# Patient Record
Sex: Female | Born: 1975 | Race: White | Hispanic: No | Marital: Married | Smoking: Never smoker
Health system: Southern US, Community
[De-identification: ages and names within clinical notes are randomized; demographics above are authoritative.]

## PROBLEM LIST (undated history)

## (undated) DIAGNOSIS — E119 Type 2 diabetes mellitus without complications: Secondary | ICD-10-CM

## (undated) DIAGNOSIS — E669 Obesity, unspecified: Secondary | ICD-10-CM

## (undated) DIAGNOSIS — I739 Peripheral vascular disease, unspecified: Secondary | ICD-10-CM

## (undated) DIAGNOSIS — G4730 Sleep apnea, unspecified: Secondary | ICD-10-CM

## (undated) DIAGNOSIS — R7989 Other specified abnormal findings of blood chemistry: Secondary | ICD-10-CM

## (undated) DIAGNOSIS — E042 Nontoxic multinodular goiter: Secondary | ICD-10-CM

## (undated) DIAGNOSIS — Z794 Long term (current) use of insulin: Secondary | ICD-10-CM

## (undated) DIAGNOSIS — Z86718 Personal history of other venous thrombosis and embolism: Secondary | ICD-10-CM

## (undated) DIAGNOSIS — Z87442 Personal history of urinary calculi: Secondary | ICD-10-CM

## (undated) DIAGNOSIS — I200 Unstable angina: Secondary | ICD-10-CM

## (undated) DIAGNOSIS — I10 Essential (primary) hypertension: Secondary | ICD-10-CM

## (undated) HISTORY — DX: Type 2 diabetes mellitus without complications: Z79.4

## (undated) HISTORY — DX: Other specified abnormal findings of blood chemistry: R79.89

## (undated) HISTORY — DX: Personal history of other venous thrombosis and embolism: Z86.718

## (undated) HISTORY — DX: Type 2 diabetes mellitus without complications: E11.9

## (undated) HISTORY — DX: Unstable angina: I20.0

---

## 1999-12-03 ENCOUNTER — Encounter: Payer: Self-pay | Admitting: Internal Medicine

## 1999-12-03 ENCOUNTER — Ambulatory Visit (HOSPITAL_COMMUNITY): Admission: RE | Admit: 1999-12-03 | Discharge: 1999-12-03 | Payer: Self-pay | Admitting: Internal Medicine

## 2007-03-28 HISTORY — PX: OTHER SURGICAL HISTORY: SHX169

## 2008-12-21 ENCOUNTER — Ambulatory Visit (HOSPITAL_COMMUNITY): Admission: RE | Admit: 2008-12-21 | Discharge: 2008-12-21 | Payer: Self-pay | Admitting: *Deleted

## 2012-07-08 DIAGNOSIS — E782 Mixed hyperlipidemia: Secondary | ICD-10-CM | POA: Insufficient documentation

## 2012-07-08 DIAGNOSIS — N200 Calculus of kidney: Secondary | ICD-10-CM | POA: Insufficient documentation

## 2012-07-08 DIAGNOSIS — E785 Hyperlipidemia, unspecified: Secondary | ICD-10-CM

## 2012-07-08 DIAGNOSIS — E119 Type 2 diabetes mellitus without complications: Secondary | ICD-10-CM

## 2012-07-08 DIAGNOSIS — Z794 Long term (current) use of insulin: Secondary | ICD-10-CM

## 2012-07-08 DIAGNOSIS — I2510 Atherosclerotic heart disease of native coronary artery without angina pectoris: Secondary | ICD-10-CM

## 2012-07-08 DIAGNOSIS — R319 Hematuria, unspecified: Secondary | ICD-10-CM

## 2012-07-08 HISTORY — DX: Atherosclerotic heart disease of native coronary artery without angina pectoris: I25.10

## 2012-07-08 HISTORY — DX: Hematuria, unspecified: R31.9

## 2012-07-08 HISTORY — DX: Hyperlipidemia, unspecified: E78.5

## 2012-07-08 HISTORY — DX: Long term (current) use of insulin: Z79.4

## 2012-07-08 HISTORY — DX: Calculus of kidney: N20.0

## 2012-07-08 HISTORY — DX: Type 2 diabetes mellitus without complications: E11.9

## 2014-02-19 DIAGNOSIS — Z9884 Bariatric surgery status: Secondary | ICD-10-CM | POA: Insufficient documentation

## 2014-02-19 HISTORY — DX: Bariatric surgery status: Z98.84

## 2019-06-16 ENCOUNTER — Other Ambulatory Visit: Payer: Self-pay

## 2019-06-16 ENCOUNTER — Encounter (HOSPITAL_COMMUNITY): Payer: Self-pay | Admitting: Emergency Medicine

## 2019-06-16 ENCOUNTER — Emergency Department (HOSPITAL_COMMUNITY): Payer: 59

## 2019-06-16 ENCOUNTER — Inpatient Hospital Stay (HOSPITAL_COMMUNITY)
Admission: EM | Admit: 2019-06-16 | Discharge: 2019-06-24 | DRG: 234 | Disposition: A | Discharge status: Home / Self Care | Payer: 59 | Attending: Thoracic Surgery (Cardiothoracic Vascular Surgery) | Admitting: Thoracic Surgery (Cardiothoracic Vascular Surgery)

## 2019-06-16 DIAGNOSIS — Z20822 Contact with and (suspected) exposure to covid-19: Secondary | ICD-10-CM | POA: Diagnosis present

## 2019-06-16 DIAGNOSIS — I10 Essential (primary) hypertension: Secondary | ICD-10-CM | POA: Diagnosis not present

## 2019-06-16 DIAGNOSIS — R079 Chest pain, unspecified: Secondary | ICD-10-CM | POA: Diagnosis present

## 2019-06-16 DIAGNOSIS — I119 Hypertensive heart disease without heart failure: Secondary | ICD-10-CM | POA: Diagnosis present

## 2019-06-16 DIAGNOSIS — Z951 Presence of aortocoronary bypass graft: Secondary | ICD-10-CM

## 2019-06-16 DIAGNOSIS — D518 Other vitamin B12 deficiency anemias: Secondary | ICD-10-CM | POA: Diagnosis present

## 2019-06-16 DIAGNOSIS — Z6841 Body Mass Index (BMI) 40.0 and over, adult: Secondary | ICD-10-CM

## 2019-06-16 DIAGNOSIS — Z955 Presence of coronary angioplasty implant and graft: Secondary | ICD-10-CM

## 2019-06-16 DIAGNOSIS — Z09 Encounter for follow-up examination after completed treatment for conditions other than malignant neoplasm: Secondary | ICD-10-CM

## 2019-06-16 DIAGNOSIS — Z9641 Presence of insulin pump (external) (internal): Secondary | ICD-10-CM | POA: Diagnosis present

## 2019-06-16 DIAGNOSIS — R7989 Other specified abnormal findings of blood chemistry: Secondary | ICD-10-CM

## 2019-06-16 DIAGNOSIS — E8770 Fluid overload, unspecified: Secondary | ICD-10-CM | POA: Diagnosis not present

## 2019-06-16 DIAGNOSIS — E119 Type 2 diabetes mellitus without complications: Secondary | ICD-10-CM

## 2019-06-16 DIAGNOSIS — E872 Acidosis: Secondary | ICD-10-CM | POA: Diagnosis not present

## 2019-06-16 DIAGNOSIS — E669 Obesity, unspecified: Secondary | ICD-10-CM | POA: Diagnosis present

## 2019-06-16 DIAGNOSIS — Z9111 Patient's noncompliance with dietary regimen: Secondary | ICD-10-CM

## 2019-06-16 DIAGNOSIS — Z91013 Allergy to seafood: Secondary | ICD-10-CM

## 2019-06-16 DIAGNOSIS — I25119 Atherosclerotic heart disease of native coronary artery with unspecified angina pectoris: Secondary | ICD-10-CM

## 2019-06-16 DIAGNOSIS — Z9884 Bariatric surgery status: Secondary | ICD-10-CM

## 2019-06-16 DIAGNOSIS — D62 Acute posthemorrhagic anemia: Secondary | ICD-10-CM | POA: Diagnosis not present

## 2019-06-16 DIAGNOSIS — E785 Hyperlipidemia, unspecified: Secondary | ICD-10-CM | POA: Diagnosis present

## 2019-06-16 DIAGNOSIS — E282 Polycystic ovarian syndrome: Secondary | ICD-10-CM | POA: Diagnosis present

## 2019-06-16 DIAGNOSIS — Z8249 Family history of ischemic heart disease and other diseases of the circulatory system: Secondary | ICD-10-CM

## 2019-06-16 DIAGNOSIS — J9811 Atelectasis: Secondary | ICD-10-CM

## 2019-06-16 DIAGNOSIS — Z7982 Long term (current) use of aspirin: Secondary | ICD-10-CM

## 2019-06-16 DIAGNOSIS — Z794 Long term (current) use of insulin: Secondary | ICD-10-CM

## 2019-06-16 DIAGNOSIS — R197 Diarrhea, unspecified: Secondary | ICD-10-CM | POA: Diagnosis not present

## 2019-06-16 DIAGNOSIS — E876 Hypokalemia: Secondary | ICD-10-CM | POA: Diagnosis present

## 2019-06-16 DIAGNOSIS — I2510 Atherosclerotic heart disease of native coronary artery without angina pectoris: Secondary | ICD-10-CM | POA: Diagnosis present

## 2019-06-16 DIAGNOSIS — I25110 Atherosclerotic heart disease of native coronary artery with unstable angina pectoris: Principal | ICD-10-CM | POA: Diagnosis present

## 2019-06-16 DIAGNOSIS — I2582 Chronic total occlusion of coronary artery: Secondary | ICD-10-CM | POA: Diagnosis present

## 2019-06-16 DIAGNOSIS — Z86718 Personal history of other venous thrombosis and embolism: Secondary | ICD-10-CM

## 2019-06-16 DIAGNOSIS — D649 Anemia, unspecified: Secondary | ICD-10-CM | POA: Diagnosis present

## 2019-06-16 DIAGNOSIS — Z91048 Other nonmedicinal substance allergy status: Secondary | ICD-10-CM

## 2019-06-16 DIAGNOSIS — I252 Old myocardial infarction: Secondary | ICD-10-CM

## 2019-06-16 DIAGNOSIS — I8391 Asymptomatic varicose veins of right lower extremity: Secondary | ICD-10-CM | POA: Diagnosis present

## 2019-06-16 DIAGNOSIS — Z79899 Other long term (current) drug therapy: Secondary | ICD-10-CM

## 2019-06-16 DIAGNOSIS — I700 Atherosclerosis of aorta: Secondary | ICD-10-CM | POA: Diagnosis present

## 2019-06-16 DIAGNOSIS — I340 Nonrheumatic mitral (valve) insufficiency: Secondary | ICD-10-CM | POA: Diagnosis present

## 2019-06-16 DIAGNOSIS — Z833 Family history of diabetes mellitus: Secondary | ICD-10-CM

## 2019-06-16 DIAGNOSIS — E1151 Type 2 diabetes mellitus with diabetic peripheral angiopathy without gangrene: Secondary | ICD-10-CM | POA: Diagnosis present

## 2019-06-16 HISTORY — DX: Type 2 diabetes mellitus without complications: E11.9

## 2019-06-16 HISTORY — DX: Atherosclerotic heart disease of native coronary artery without angina pectoris: I25.10

## 2019-06-16 HISTORY — DX: Obesity, unspecified: E66.9

## 2019-06-16 HISTORY — DX: Essential (primary) hypertension: I10

## 2019-06-16 HISTORY — DX: Chest pain, unspecified: R07.9

## 2019-06-16 HISTORY — DX: Nontoxic multinodular goiter: E04.2

## 2019-06-16 LAB — CBC
HCT: 42.1 % (ref 36.0–46.0)
Hemoglobin: 14.4 g/dL (ref 12.0–15.0)
MCH: 30.2 pg (ref 26.0–34.0)
MCHC: 34.2 g/dL (ref 30.0–36.0)
MCV: 88.3 fL (ref 80.0–100.0)
Platelets: 308 10*3/uL (ref 150–400)
RBC: 4.77 MIL/uL (ref 3.87–5.11)
RDW: 12.5 % (ref 11.5–15.5)
WBC: 8.3 10*3/uL (ref 4.0–10.5)
nRBC: 0 % (ref 0.0–0.2)

## 2019-06-16 LAB — BASIC METABOLIC PANEL
Anion gap: 9 (ref 5–15)
BUN: 11 mg/dL (ref 6–20)
CO2: 28 mmol/L (ref 22–32)
Calcium: 9.3 mg/dL (ref 8.9–10.3)
Chloride: 100 mmol/L (ref 98–111)
Creatinine, Ser: 0.51 mg/dL (ref 0.44–1.00)
GFR calc Af Amer: >60 mL/min (ref >60)
GFR calc non Af Amer: >60 mL/min (ref >60)
Glucose, Bld: 227 mg/dL — ABNORMAL HIGH (ref 70–99)
Potassium: 3.9 mmol/L (ref 3.5–5.1)
Sodium: 137 mmol/L (ref 135–145)

## 2019-06-16 LAB — I-STAT BETA HCG BLOOD, ED (MC, WL, AP ONLY): I-stat hCG, quantitative: <5 m[IU]/mL (ref <5)

## 2019-06-16 LAB — TROPONIN I (HIGH SENSITIVITY)
Troponin I (High Sensitivity): 14 ng/L (ref <18)
Troponin I (High Sensitivity): 16 ng/L (ref <18)

## 2019-06-16 LAB — D-DIMER, QUANTITATIVE: D-Dimer, Quant: 0.81 ug/mL-FEU — ABNORMAL HIGH (ref 0.00–0.50)

## 2019-06-16 MED ORDER — LIDOCAINE VISCOUS HCL 2 % MT SOLN
15.0000 mL | Freq: Once | OROMUCOSAL | Status: AC
  Administered 2019-06-16: 15 mL via ORAL
  Filled 2019-06-16: qty 15

## 2019-06-16 MED ORDER — ALUM & MAG HYDROXIDE-SIMETH 200-200-20 MG/5ML PO SUSP
30.0000 mL | Freq: Once | ORAL | Status: AC
  Administered 2019-06-16: 30 mL via ORAL
  Filled 2019-06-16: qty 30

## 2019-06-16 MED ORDER — SODIUM CHLORIDE 0.9% FLUSH: 3.0000 mL | Freq: Once | INTRAVENOUS | Status: DC

## 2019-06-16 NOTE — ED Provider Notes (Signed)
[facility] EMERGENCY DEPARTMENT Provider Note   CSN: [id number] Arrival date & time: 06/16/19  1612     History No chief complaint on file.   Beverly [name] is a 44 y.o. female.  HPI Patient presents with chest pain.  Has had episodes for couple weeks now.  However has had some pain that feels like reflux, also states however she has pain that goes to her left chest and the arm and up to her left jaw.  She has had a previous stent in 2009 and states that it did feel like this.  No cough.  No fevers.  States has had GI symptoms like this somewhat in the past also.  No fevers.  Has had shortness of breath also.    Past Medical History:  Diagnosis Date  . Diabetes mellitus without complication (HCC)   . Hypertension   . Multiple thyroid nodules     There are no problems to display for this patient.   Past Surgical History:  Procedure Laterality Date  . Cardiac Stent  2009     OB History   No obstetric history on file.     History reviewed. No pertinent family history.  Social History   Tobacco Use  . Smoking status: Never Smoker  . Smokeless tobacco: Never Used  Substance Use Topics  . Alcohol use: Never  . Drug use: Never    Home Medications Prior to Admission medications   Medication Sig Start Date End Date Taking? Authorizing Provider  aluminum-magnesium hydroxide-simethicone (MAALOX) 200-200-20 MG/5ML SUSP Take 30 mLs by mouth 3 (three) times daily as needed (for gas/indigestion).    Yes [provider]  aspirin 81 MG EC tablet Take 81 mg by mouth daily.    Yes [provider]  atorvastatin (LIPITOR) 40 MG tablet Take 40 mg by mouth at bedtime.  12/19/13  Yes [provider]  carvedilol (COREG) 12.5 MG tablet Take 12.5 mg by mouth in the morning.    Yes [provider]  gabapentin (NEURONTIN) 300 MG capsule Take 300 mg by mouth at bedtime.   Yes [provider]  ibuprofen (ADVIL) 200 MG tablet  Take 600 mg by mouth every 6 (six) hours as needed (for pain).   Yes [provider]  Insulin Human (INSULIN PUMP) SOLN Inject 1 each into the skin continuous. MedTronic- Uses Humalog   Yes [provider]  insulin lispro (HUMALOG) 100 UNIT/ML injection Inject into the skin See admin instructions. For use in [employer] insulin pump   Yes [provider]  losartan (COZAAR) 100 MG tablet Take 100 mg by mouth daily.  01/12/14  Yes [provider]  Multiple Vitamins-Calcium (ONE-A-DAY WOMENS FORMULA) TABS Take 1 tablet by mouth daily with breakfast.   Yes [provider]  simethicone (GAS-X EXTRA STRENGTH) 125 MG chewable tablet Chew 125 mg by mouth every 6 (six) hours as needed for flatulence.   Yes [provider]    Allergies    Shellfish-derived products and Tape  Review of Systems   Review of Systems  Constitutional: Negative for appetite change.  HENT: Negative for congestion.   Respiratory: Positive for shortness of breath.   Cardiovascular: Positive for chest pain.  Gastrointestinal: Negative for abdominal pain and nausea.  Genitourinary: Negative for flank pain.  Musculoskeletal: Negative for back pain.  Skin: Negative for rash.  Neurological: Negative for weakness.  Psychiatric/Behavioral: Negative for confusion.    Physical Exam Updated Vital Signs BP 134/77  Pulse 73   Temp 98.8 F (37.1 C) (Oral)   Resp 17   Ht 5' (1.524 m)   Wt 111.1 kg   SpO2 97%   BMI 47.85 kg/m   Physical Exam Vitals and nursing note reviewed.  HENT:     Head: Normocephalic.  Eyes:     Pupils: Pupils are equal, round, and reactive to light.  Cardiovascular:     Rate and Rhythm: Regular rhythm.  Pulmonary:     Breath sounds: No wheezing or rhonchi.  Abdominal:     Tenderness: There is no abdominal tenderness.  Musculoskeletal:     Cervical back: Neck supple.     Right lower leg: No edema.     Left lower leg: No edema.  Skin:     General: Skin is warm.     Capillary Refill: Capillary refill takes less than 2 seconds.  Neurological:     Mental Status: She is alert and oriented to person, place, and time.     ED Results / Procedures / Treatments   Labs (all labs ordered are listed, but only abnormal results are displayed) Labs Reviewed  BASIC METABOLIC PANEL - Abnormal; Notable for the following components:      Result Value   Glucose, Bld 227 (*)    All other components within normal limits  D-DIMER, QUANTITATIVE (NOT AT [facility]) - Abnormal; Notable for the following components:   D-Dimer, Quant 0.81 (*)    All other components within normal limits  CBC  I-STAT BETA HCG BLOOD, ED ([facility], [facility], [facility] ONLY)  TROPONIN I (HIGH SENSITIVITY)  TROPONIN I (HIGH SENSITIVITY)    EKG EKG Interpretation  Date/Time:  Monday June 16 2019 16:19:28 EDT Ventricular Rate:  75 PR Interval:  174 QRS Duration: 92 QT Interval:  358 QTC Calculation: 399 R Axis:   -15 Text Interpretation: Normal sinus rhythm Inferior infarct , age undetermined Anterior infarct , age undetermined Abnormal ECG No old tracing to compare Confirmed by Benjiman Core [phone]) on 06/16/2019 9:03:15 PM   Radiology DG Chest 2 View  Result Date: 06/16/2019 CLINICAL DATA:  Chest pain, neck pain, nausea, history MI EXAM: CHEST - 2 VIEW COMPARISON:  01/18/2018 FINDINGS: Normal heart size, mediastinal contours, and pulmonary vascularity. Atherosclerotic calcification aorta. Lungs clear. No pulmonary infiltrate, pleural effusion or pneumothorax. Bones unremarkable. IMPRESSION: No acute abnormalities. Aortic Atherosclerosis (ICD10-I70.0). Electronically Signed   By: Ulyses Southward M.D.   On: 06/16/2019 16:40    Procedures Procedures (including critical care time)  Medications Ordered in ED Medications  sodium chloride flush (NS) 0.9 % injection 3 mL (3 mLs Intravenous Not Given 06/16/19 2109)  alum & mag hydroxide-simeth (MAALOX/MYLANTA) 200-200-20 MG/5ML  suspension 30 mL (30 mLs Oral Given 06/16/19 2201)    And  lidocaine (XYLOCAINE) 2 % viscous mouth solution 15 mL (15 mLs Oral Given 06/16/19 2201)    ED Course  I have reviewed the triage vital signs and the nursing notes.  Pertinent labs & imaging results that were available during my care of the patient were reviewed by me and considered in my medical decision making (see chart for details).    MDM Rules/Calculators/A&P                      Patient with chest pain radiates to her neck and jaw.  States it does feel like previous heart attack, EKG shows inferior Q waves, however no old to compare.  Has had previous stent.  Troponin  reassuring x2, D-dimer elevated.  Has a history of PE and states she is no longer on her blood thinners.  Will get CTA, however I think should be admitted for chest pain due to left jaw pain like previous angina, even though it was relieved with GI cocktail.  Care will be turned over to Dr. Roxanne Mins. Final Clinical Impression(s) / ED Diagnoses Final diagnoses:  Chest pain, unspecified type    Rx / DC Orders ED Discharge Orders    None       Davonna Belling, MD 06/16/19 2306

## 2019-06-16 NOTE — ED Triage Notes (Signed)
Pt here from home with c/o neck pain that radiates down to her chest , along with some nausea, hx of MI

## 2019-06-17 ENCOUNTER — Observation Stay (HOSPITAL_COMMUNITY): Payer: 59

## 2019-06-17 ENCOUNTER — Encounter (HOSPITAL_COMMUNITY): Payer: Self-pay | Admitting: Family Medicine

## 2019-06-17 DIAGNOSIS — D518 Other vitamin B12 deficiency anemias: Secondary | ICD-10-CM | POA: Diagnosis present

## 2019-06-17 DIAGNOSIS — E876 Hypokalemia: Secondary | ICD-10-CM | POA: Diagnosis present

## 2019-06-17 DIAGNOSIS — D649 Anemia, unspecified: Secondary | ICD-10-CM

## 2019-06-17 DIAGNOSIS — E119 Type 2 diabetes mellitus without complications: Secondary | ICD-10-CM | POA: Diagnosis not present

## 2019-06-17 DIAGNOSIS — I25110 Atherosclerotic heart disease of native coronary artery with unstable angina pectoris: Secondary | ICD-10-CM | POA: Diagnosis not present

## 2019-06-17 DIAGNOSIS — R7989 Other specified abnormal findings of blood chemistry: Secondary | ICD-10-CM | POA: Diagnosis present

## 2019-06-17 DIAGNOSIS — I200 Unstable angina: Secondary | ICD-10-CM

## 2019-06-17 DIAGNOSIS — R079 Chest pain, unspecified: Secondary | ICD-10-CM

## 2019-06-17 DIAGNOSIS — Z794 Long term (current) use of insulin: Secondary | ICD-10-CM | POA: Diagnosis not present

## 2019-06-17 DIAGNOSIS — E669 Obesity, unspecified: Secondary | ICD-10-CM | POA: Diagnosis present

## 2019-06-17 HISTORY — DX: Hypokalemia: E87.6

## 2019-06-17 HISTORY — DX: Anemia, unspecified: D64.9

## 2019-06-17 LAB — HIV ANTIBODY (ROUTINE TESTING W REFLEX): HIV Screen 4th Generation wRfx: NONREACTIVE

## 2019-06-17 LAB — ECHOCARDIOGRAM COMPLETE
Height: 60 in
Weight: 3920 oz

## 2019-06-17 LAB — BASIC METABOLIC PANEL
Anion gap: 9 (ref 5–15)
BUN: 10 mg/dL (ref 6–20)
CO2: 25 mmol/L (ref 22–32)
Calcium: 8 mg/dL — ABNORMAL LOW (ref 8.9–10.3)
Chloride: 107 mmol/L (ref 98–111)
Creatinine, Ser: 0.49 mg/dL (ref 0.44–1.00)
GFR calc Af Amer: >60 mL/min (ref >60)
GFR calc non Af Amer: >60 mL/min (ref >60)
Glucose, Bld: 173 mg/dL — ABNORMAL HIGH (ref 70–99)
Potassium: 3.4 mmol/L — ABNORMAL LOW (ref 3.5–5.1)
Sodium: 141 mmol/L (ref 135–145)

## 2019-06-17 LAB — IRON AND TIBC
Iron: 100 ug/dL (ref 28–170)
Saturation Ratios: 31 % (ref 10.4–31.8)
TIBC: 323 ug/dL (ref 250–450)
UIBC: 223 ug/dL

## 2019-06-17 LAB — CBG MONITORING, ED
Glucose-Capillary: 214 mg/dL — ABNORMAL HIGH (ref 70–99)
Glucose-Capillary: 153 mg/dL — ABNORMAL HIGH (ref 70–99)
Glucose-Capillary: 147 mg/dL — ABNORMAL HIGH (ref 70–99)

## 2019-06-17 LAB — CBC
HCT: 30.2 % — ABNORMAL LOW (ref 36.0–46.0)
HCT: 42 % (ref 36.0–46.0)
Hemoglobin: 10 g/dL — ABNORMAL LOW (ref 12.0–15.0)
Hemoglobin: 14.3 g/dL (ref 12.0–15.0)
MCH: 29.9 pg (ref 26.0–34.0)
MCH: 30 pg (ref 26.0–34.0)
MCHC: 33.1 g/dL (ref 30.0–36.0)
MCHC: 34 g/dL (ref 30.0–36.0)
MCV: 90.4 fL (ref 80.0–100.0)
MCV: 88.1 fL (ref 80.0–100.0)
Platelets: 209 10*3/uL (ref 150–400)
Platelets: 330 10*3/uL (ref 150–400)
RBC: 3.34 MIL/uL — ABNORMAL LOW (ref 3.87–5.11)
RBC: 4.77 MIL/uL (ref 3.87–5.11)
RDW: 12.6 % (ref 11.5–15.5)
RDW: 12.6 % (ref 11.5–15.5)
WBC: 5.3 10*3/uL (ref 4.0–10.5)
WBC: 8.3 10*3/uL (ref 4.0–10.5)
nRBC: 0 % (ref 0.0–0.2)
nRBC: 0 % (ref 0.0–0.2)

## 2019-06-17 LAB — RETICULOCYTES
Immature Retic Fract: 12.7 % (ref 2.3–15.9)
RBC.: 3.31 MIL/uL — ABNORMAL LOW (ref 3.87–5.11)
Retic Count, Absolute: 68.8 10*3/uL (ref 19.0–186.0)
Retic Ct Pct: 2.1 % (ref 0.4–3.1)

## 2019-06-17 LAB — GLUCOSE, CAPILLARY
Glucose-Capillary: 82 mg/dL (ref 70–99)
Glucose-Capillary: 205 mg/dL — ABNORMAL HIGH (ref 70–99)

## 2019-06-17 LAB — LIPID PANEL
Cholesterol: 214 mg/dL — ABNORMAL HIGH (ref 0–200)
HDL: 35 mg/dL — ABNORMAL LOW (ref >40)
LDL Cholesterol: 156 mg/dL — ABNORMAL HIGH (ref 0–99)
Total CHOL/HDL Ratio: 6.1 RATIO
Triglycerides: 113 mg/dL (ref <150)
VLDL: 23 mg/dL (ref 0–40)

## 2019-06-17 LAB — VITAMIN B12: Vitamin B-12: 314 pg/mL (ref 180–914)

## 2019-06-17 LAB — FERRITIN: Ferritin: 29 ng/mL (ref 11–307)

## 2019-06-17 LAB — SARS CORONAVIRUS 2 (TAT 6-24 HRS): SARS Coronavirus 2: NEGATIVE

## 2019-06-17 LAB — HEMOGLOBIN A1C
Hgb A1c MFr Bld: 10.2 % — ABNORMAL HIGH (ref 4.8–5.6)
Mean Plasma Glucose: 246.04 mg/dL

## 2019-06-17 LAB — FOLATE: Folate: 29.7 ng/mL (ref >5.9)

## 2019-06-17 MED ORDER — ALUM & MAG HYDROXIDE-SIMETH 200-200-20 MG/5ML PO SUSP
30.0000 mL | Freq: Four times a day (QID) | ORAL | Status: DC | PRN
  Administered 2019-06-17: 30 mL via ORAL
  Filled 2019-06-17: qty 30

## 2019-06-17 MED ORDER — NITROGLYCERIN 0.4 MG SL SUBL: 0.4000 mg | SUBLINGUAL_TABLET | SUBLINGUAL | Status: DC | PRN

## 2019-06-17 MED ORDER — ASPIRIN 81 MG PO CHEW
324.0000 mg | CHEWABLE_TABLET | ORAL | Status: AC
  Administered 2019-06-17: 324 mg via ORAL
  Filled 2019-06-17: qty 4

## 2019-06-17 MED ORDER — ASPIRIN 81 MG PO CHEW
81.0000 mg | CHEWABLE_TABLET | Freq: Every day | ORAL | Status: DC
  Administered 2019-06-17 – 2019-06-18 (×2): 81 mg via ORAL
  Filled 2019-06-17 (×2): qty 1

## 2019-06-17 MED ORDER — ASPIRIN 300 MG RE SUPP: 300.0000 mg | RECTAL | Status: AC

## 2019-06-17 MED ORDER — ENOXAPARIN SODIUM 40 MG/0.4ML ~~LOC~~ SOLN
40.0000 mg | SUBCUTANEOUS | Status: DC
  Administered 2019-06-17 – 2019-06-19 (×3): 40 mg via SUBCUTANEOUS
  Filled 2019-06-17 (×3): qty 0.4

## 2019-06-17 MED ORDER — LOSARTAN POTASSIUM 50 MG PO TABS
100.0000 mg | ORAL_TABLET | Freq: Every day | ORAL | Status: DC
  Administered 2019-06-17 – 2019-06-18 (×2): 100 mg via ORAL
  Filled 2019-06-17 (×2): qty 2

## 2019-06-17 MED ORDER — CARVEDILOL 12.5 MG PO TABS
12.5000 mg | ORAL_TABLET | Freq: Every morning | ORAL | Status: DC
  Administered 2019-06-17 – 2019-06-19 (×3): 12.5 mg via ORAL
  Filled 2019-06-17 (×3): qty 1

## 2019-06-17 MED ORDER — IOHEXOL 350 MG/ML SOLN
75.0000 mL | Freq: Once | INTRAVENOUS | Status: AC | PRN
  Administered 2019-06-17: 75 mL via INTRAVENOUS

## 2019-06-17 MED ORDER — ACETAMINOPHEN 325 MG PO TABS: 650.0000 mg | ORAL_TABLET | ORAL | Status: DC | PRN

## 2019-06-17 MED ORDER — ONDANSETRON HCL 4 MG/2ML IJ SOLN: 4.0000 mg | Freq: Four times a day (QID) | INTRAMUSCULAR | Status: DC | PRN

## 2019-06-17 MED ORDER — INSULIN PUMP
Freq: Three times a day (TID) | SUBCUTANEOUS | Status: DC
  Administered 2019-06-17: 2.9 via SUBCUTANEOUS
  Administered 2019-06-18: 22:00:00 0.9 via SUBCUTANEOUS
  Filled 2019-06-17: qty 1

## 2019-06-17 MED ORDER — FAMOTIDINE IN NACL 20-0.9 MG/50ML-% IV SOLN
20.0000 mg | Freq: Two times a day (BID) | INTRAVENOUS | Status: DC
  Administered 2019-06-17 – 2019-06-18 (×4): 20 mg via INTRAVENOUS
  Filled 2019-06-17 (×4): qty 50

## 2019-06-17 MED ORDER — GABAPENTIN 300 MG PO CAPS
300.0000 mg | ORAL_CAPSULE | Freq: Every day | ORAL | Status: DC
  Administered 2019-06-17 – 2019-06-18 (×3): 300 mg via ORAL
  Filled 2019-06-17: qty 1
  Filled 2019-06-17: qty 3
  Filled 2019-06-17: qty 1

## 2019-06-17 MED ORDER — POTASSIUM CHLORIDE CRYS ER 20 MEQ PO TBCR
40.0000 meq | EXTENDED_RELEASE_TABLET | Freq: Once | ORAL | Status: AC
  Administered 2019-06-17: 10:00:00 40 meq via ORAL
  Filled 2019-06-17: qty 2

## 2019-06-17 MED ORDER — ATORVASTATIN CALCIUM 40 MG PO TABS
40.0000 mg | ORAL_TABLET | Freq: Every day | ORAL | Status: DC
  Administered 2019-06-17: 16:00:00 40 mg via ORAL
  Filled 2019-06-17: qty 1

## 2019-06-17 NOTE — Consult Note (Addendum)
Cardiology Consultation:   Patient ID: Beverly [name] MRN: [id number]; DOB: February 09, 1976  Admit date: 06/16/2019 Date of Consult: 06/17/2019  Primary Care Provider: [name], MD Primary Cardiologist: remotely Dr. Bing Matter (2015) Primary Electrophysiologist:  None    Patient Profile:   Beverly [name] is a 44 y.o. female with a hx of DM II on insulin pump, CAD s/p stent 2009, HTN, HLD, h/o DVT, morbid obesity s/p bariatric surgery and PCOS who is being seen today for the evaluation of chest pain at the request of Dr. Irene Limbo.  History of Present Illness:   Beverly [name] is a pleasant 44 year old female with past medical history of DM II on insulin pump, CAD s/p stent 2009, HTN, HLD, h/o DVT, morbid obesity s/p bariatric surgery and PCOS.  She underwent cardiac catheterization and PCI to unknown vessel in the setting of MI in 2009.  Afterward, she was followed up with Dr. Bing Matter.  She has been lost to follow-up since 2015.  According to patient, she has only been intermittently compliant with the statin medication.  On average, she missed at least 2 days of her statin medication per week.  Since this past weekend, she has been having intermittent left-sided chest pain under the left breast.  She also has not been noticing some nausea and vomiting.  The symptoms is worsened by physical exertion and relieved by rest.  However the pain can also occur at rest.  She says sometimes start chest discomfort last for several hours at a time.  The location of the symptom is similar to the previous angina, however she does not remember nausea and vomiting associated with the previous MI.  Eventually, she sought medical attention on 06/16/2019 because of the persistent nausea and vomiting.  Despite intermittent chest pain, high-sensitivity troponin was negative x2.  D-dimer was elevated.  However CT angiogram of the chest was negative for PE, it does show scant coronary artery disease which appears to be  advanced for her age.  COVID-19 test was negative.  EKG shows sinus rhythm with poor R wave progression in anterior leads and evidence of Q waves in the inferior leads.  There is no previous EKG to compare.  Cardiology has been consulted for her chest pain.   Past Medical History:  Diagnosis Date  . CAD (coronary artery disease) 06/16/2019  . Diabetes mellitus without complication (HCC)   . Hypertension   . Multiple thyroid nodules   . Obesity     Past Surgical History:  Procedure Laterality Date  . Cardiac Stent  2009     Home Medications:  Prior to Admission medications   Medication Sig Start Date End Date Taking? Authorizing Provider  aluminum-magnesium hydroxide-simethicone (MAALOX) 200-200-20 MG/5ML SUSP Take 30 mLs by mouth 3 (three) times daily as needed (for gas/indigestion).    Yes [provider]  aspirin 81 MG EC tablet Take 81 mg by mouth daily.    Yes [provider]  atorvastatin (LIPITOR) 40 MG tablet Take 40 mg by mouth at bedtime.  12/19/13  Yes [provider]  carvedilol (COREG) 12.5 MG tablet Take 12.5 mg by mouth in the morning.    Yes [provider]  gabapentin (NEURONTIN) 300 MG capsule Take 300 mg by mouth at bedtime.   Yes [provider]  ibuprofen (ADVIL) 200 MG tablet Take 600 mg by mouth every 6 (six) hours as needed (for pain).   Yes [provider]  Insulin Human (INSULIN PUMP) SOLN Inject 1  each into the skin continuous. [employer]- Uses Humalog   Yes [provider]  insulin lispro (HUMALOG) 100 UNIT/ML injection Inject into the skin See admin instructions. For use in [employer] insulin pump   Yes [provider]  losartan (COZAAR) 100 MG tablet Take 100 mg by mouth daily.  01/12/14  Yes [provider]  Multiple Vitamins-Calcium (ONE-A-DAY WOMENS FORMULA) TABS Take 1 tablet by mouth daily with breakfast.   Yes [provider]  simethicone (GAS-X EXTRA STRENGTH) 125 MG  chewable tablet Chew 125 mg by mouth every 6 (six) hours as needed for flatulence.   Yes [provider]    Inpatient Medications: Scheduled Meds: . aspirin  81 mg Oral Daily  . atorvastatin  40 mg Oral q1800  . carvedilol  12.5 mg Oral q AM  . enoxaparin (LOVENOX) injection  40 mg Subcutaneous Q24H  . gabapentin  300 mg Oral QHS  . insulin pump   Subcutaneous TID WC, HS, 0200  . losartan  100 mg Oral Daily  . sodium chloride flush  3 mL Intravenous Once   Continuous Infusions: . famotidine (PEPCID) IV Stopped (06/17/19 1110)   PRN Meds: acetaminophen, alum & mag hydroxide-simeth, nitroGLYCERIN, ondansetron (ZOFRAN) IV  Allergies:    Allergies  Allergen Reactions  . Shellfish-Derived Products Diarrhea  . Tape Other (See Comments)    Can blister the skin    Social History:   Social History   Socioeconomic History  . Marital status: Single    Spouse name: Not on file  . Number of children: Not on file  . Years of education: Not on file  . Highest education level: Not on file  Occupational History  . Not on file  Tobacco Use  . Smoking status: Never Smoker  . Smokeless tobacco: Never Used  Substance and Sexual Activity  . Alcohol use: Never  . Drug use: Never  . Sexual activity: Yes  Other Topics Concern  . Not on file  Social History Narrative  . Not on file   Social Determinants of Health   Financial Resource Strain:   . Difficulty of Paying Living Expenses:   Food Insecurity:   . Worried About Programme researcher, broadcasting/film/video in the Last Year:   . Barista in the Last Year:   Transportation Needs:   . Freight forwarder (Medical):   [name] Lack of Transportation (Non-Medical):   Physical Activity:   . Days of Exercise per Week:   . Minutes of Exercise per Session:   Stress:   . Feeling of Stress :   Social Connections:   . Frequency of Communication with Friends and Family:   . Frequency of Social Gatherings with Friends and Family:   . Attends  Religious Services:   . Active Member of Clubs or Organizations:   . Attends Banker Meetings:   [name] Marital Status:   Intimate Partner Violence:   . Fear of Current or Ex-Partner:   . Emotionally Abused:   [name] Physically Abused:   . Sexually Abused:     Family History:   Family History  Problem Relation Age of Onset  . Hypertension Mother   . Diabetes Mellitus II Mother   . Hypertension Father   . Diabetes Mellitus II Father   . Heart disease Father        ?pacemaker vs loop recorder?  . Heart disease Maternal Grandmother        CABGx 4  ROS:  Please see the history of present illness.   All other ROS reviewed and negative.     Physical Exam/Data:   Vitals:   06/17/19 0930 06/17/19 1015 06/17/19 1130 06/17/19 1200  BP: 127/65 119/68 130/73 (!) 160/67  Pulse: 73 69 73 69  Resp: 16 16 16 16   Temp:      TempSrc:      SpO2: 99% 98% 99% 99%  Weight:      Height:        Intake/Output Summary (Last 24 hours) at 06/17/2019 1324 Last data filed at 06/16/2019 2305 Gross per 24 hour  Intake 0 ml  Output 0 ml  Net 0 ml   Last 3 Weights 06/16/2019  Weight (lbs) 245 lb  Weight (kg) 111.131 kg     Body mass index is 47.85 kg/m.  General:  Well nourished, well developed, in no acute distress HEENT: normal Lymph: no adenopathy Neck: no JVD Endocrine:  No thryomegaly Vascular: No carotid bruits; FA pulses 2+ bilaterally without bruits  Cardiac:  normal S1, S2; RRR; no murmur  Lungs:  clear to auscultation bilaterally, no wheezing, rhonchi or rales  Abd: soft, nontender, no hepatomegaly  Ext: no edema Musculoskeletal:  No deformities, BUE and BLE strength normal and equal Skin: warm and dry  Neuro:  CNs 2-12 intact, no focal abnormalities noted Psych:  Normal affect   EKG:  The EKG was personally reviewed and demonstrates: Sinus rhythm with poor R wave progression anterior leads, Q waves in the inferior leads. Telemetry:  Telemetry was personally reviewed  and demonstrates: Normal sinus rhythm without ventricular ectopy  Relevant CV Studies:  N/A  Laboratory Data:  High Sensitivity Troponin:   Recent Labs  Lab 06/16/19 1620 06/16/19 1949  TROPONINIHS 14 16     Chemistry Recent Labs  Lab 06/16/19 1620 06/17/19 0500  NA 137 141  K 3.9 3.4*  CL 100 107  CO2 28 25  GLUCOSE 227* 173*  BUN 11 10  CREATININE 0.51 0.49  CALCIUM 9.3 8.0*  GFRNONAA >60 >60  GFRAA >60 >60  ANIONGAP 9 9    No results for input(s): PROT, ALBUMIN, AST, ALT, ALKPHOS, BILITOT in the last 168 hours. Hematology Recent Labs  Lab 06/16/19 1620 06/17/19 0500  WBC 8.3 5.3  RBC 4.77 3.34*  3.31*  HGB 14.4 10.0*  HCT 42.1 30.2*  MCV 88.3 90.4  MCH 30.2 29.9  MCHC 34.2 33.1  RDW 12.5 12.6  PLT 308 209   BNPNo results for input(s): BNP, PROBNP in the last 168 hours.  DDimer  Recent Labs  Lab 06/16/19 2200  DDIMER 0.81*     Radiology/Studies:  DG Chest 2 View  Result Date: 06/16/2019 CLINICAL DATA:  Chest pain, neck pain, nausea, history MI EXAM: CHEST - 2 VIEW COMPARISON:  01/18/2018 FINDINGS: Normal heart size, mediastinal contours, and pulmonary vascularity. Atherosclerotic calcification aorta. Lungs clear. No pulmonary infiltrate, pleural effusion or pneumothorax. Bones unremarkable. IMPRESSION: No acute abnormalities. Aortic Atherosclerosis (ICD10-I70.0). Electronically Signed   By: 01/20/2018 M.D.   On: 06/16/2019 16:40   CT Angio Chest PE W and/or Wo Contrast  Result Date: 06/17/2019 CLINICAL DATA:  Neck pain, chest pain, nausea EXAM: CT ANGIOGRAPHY CHEST WITH CONTRAST TECHNIQUE: Multidetector CT imaging of the chest was performed using the standard protocol during bolus administration of intravenous contrast. Multiplanar CT image reconstructions and MIPs were obtained to evaluate the vascular anatomy. CONTRAST:  28mL OMNIPAQUE IOHEXOL 350 MG/ML SOLN COMPARISON:  None. FINDINGS:  Cardiovascular: Cardiomegaly. No filling defects in the  pulmonary arteries to suggest pulmonary emboli. Heart is normal size. Diffuse coronary artery calcifications. Aortic atherosclerosis. Mediastinum/Nodes: No mediastinal, hilar, or axillary adenopathy. Trachea and esophagus are unremarkable. Thyroid unremarkable. Lungs/Pleura: Lungs are clear. No focal airspace opacities or suspicious nodules. No effusions. Upper Abdomen: Imaging into the upper abdomen shows no acute findings. Changes of prior gastric sleeve. Musculoskeletal: Chest wall soft tissues are unremarkable. No acute bony abnormality. Review of the MIP images confirms the above findings. IMPRESSION: No evidence of pulmonary embolus. Diffuse coronary artery disease, advanced for patient's age. Cardiomegaly. No acute cardiopulmonary disease. Aortic Atherosclerosis (ICD10-I70.0). Electronically Signed   By: Rolm Baptise M.D.   On: 06/17/2019 00:35     Assessment and Plan:   1. Chest pain with nausea and vomiting  -Has both typical and atypical features.  The location of the chest pain is under the left breast which is similar to the previous angina with pain radiating to the left jaw, however patient has nausea and vomiting that was not present during her previous MI.  EKG is unchanged.  Symptom occurs mostly with physical activity, however even though she says she occasionally has hours of chest pain, however troponin was negative.  Will discuss with MD regarding either Myoview versus cardiac catheterization  -obtain echocardiogram  2. CAD: She has history of early CAD at age 29 when she had the first stent.  Unfortunately I do not have the record of the previous cardiac catheterization.  3. Hypertension: Blood pressure stable  4. Hyperlipidemia: Uncontrolled however patient has not been fully compliant with statin medication  5. DM2 on insulin pump: Uncontrolled with hemoglobin A1c of 10, questionable compliance  6. History of DVT  7. Morbid obesity s/p bariatric surgery      For  questions or updates, please contact [place] Please consult [email or web address] for contact info under     Signed, Almyra Deforest, [place]  06/17/2019 1:24 PM   I have personally seen and examined this patient. I agree with the assessment and plan as outlined above.  44 yo female with history of CAD with prior coronary stenting in 2009, HTN, HLD, morbid obesity, DM presenting with exertional chest pain, jaw pain, N/V. EKG without ischemic changes. I have personally reviewed the EKG and it shows sinus with possible inferior Q waves with poor R wave progression in the precordial leads. High sensitivity troponin negative 2.  Chest CTA without PE.  My exam:  General: Obese female in NAD.   HEENT: OP clear, mucus membranes moist  SKIN: warm, dry. No rashes. Neuro: No focal deficits  Musculoskeletal: Muscle strength 5/5 all ext  Psychiatric: Mood and affect normal  Neck: No JVD, no carotid bruits, no thyromegaly, no lymphadenopathy.  Lungs:Clear bilaterally, no wheezes, rhonci, crackles Cardiovascular: Regular rate and rhythm. No murmurs, gallops or rubs. Abdomen:Soft. Bowel sounds present. Non-tender.  Extremities: No lower extremity edema. Pulses are 2 + in the bilateral DP/PT.  Chest pain concerning for unstable angina: Given her diabetes, I am concerned that the N/V and jaw pain may be her anginal equivalent. Her troponin is not elevated but given her risk factors and known CAD with prior remote coronary stenting, will proceed with cardiac cath tomorrow. Echo today. NPO at midnight. No need for IV heparin.   Lauree Chandler 06/17/2019 1:54 PM'

## 2019-06-17 NOTE — Progress Notes (Signed)
Progress Note    Beverly [name]  DXI:[id number] DOB: 08-Oct-1975  DOA: 06/16/2019 PCP: [name], MD    Brief Narrative:   Chief complaint: chest pain  Medical records reviewed and are as summarized below:  Beverly [name] is an 44 y.o. female with a past medical history that includes diabetes on the insulin pump, CAD status post stent 2009, hypertension, bariatric surgery, DVT presents emergency department on March 22 with chief complaint of chest pain.  Pain was described as intermittent over the previous 2 days located left anterior radiating to the shoulder up to the jaw and down the arm.  Associated symptoms included nausea with some vomiting.  She reports she has not seen a cardiologist since 2015.  Admitted for chest pain rule out and cardiology consult  Assessment/Plan:   Principal Problem:   Chest pain Active Problems:   CAD (coronary artery disease)   Hypertension   Insulin-requiring or dependent type II diabetes mellitus (HCC)   Positive D dimer   Hypokalemia   History of DVT (deep vein thrombosis)   Obesity   Anemia   #1.  Chest pain.  Some typical and atypical features.  Continues with pain this morning.  Describes it as "discomfort" located left anterior.  Somewhat reproducible.  EKG with no acute changes.  High-sensitivity troponin negative x2.  Chest x-ray with no acute findings.  Her D-dimer was elevated CT angio of the chest negative for PE.  She is on aspirin and beta-blocker as well as an ARB. No events on tele.  -echo as patient appears somewhat puffy -obtain lipid panel -continue aspirin and statin -supportive therapy -cardiology consult -given hx and lack of specialty follow up in conjunction with continues to have pain may benefit stress test.  #2.  Diabetes.  Patient is on insulin pump.  Serum glucose 227 on admission. -Obtain a hemoglobin A1c -Continue insulin pump -monitor  #3.  Hypertension. Fair control. Home meds include coreg,  losartan -Continue home meds -Monitor  #4.  Hypokalemia.  Mild.  Potassium level 3.4.  EKG as noted above -Replete -Recheck  #5.  Elevated D-dimer.  History of DVT.  Patient's not hypoxic.  CT angio chest negative for PE  #6.  Obesity.  BMI 47.8.  Chart review indicates status post bariatric surgery -nutritional consult  #7.  Anemia.  Hemoglobin 10 this morning.  Lab work done yesterday hemoglobin was 14.  Chart review indicates her baseline is closer to 13.  No sign symptoms of active bleeding -anemia panel -monitor    Family Communication/Anticipated D/C date and plan/Code Status   DVT prophylaxis: Lovenox ordered. Code Status: Full Code.  Family Communication: patient Disposition Plan: Home after cardiology work-up   Medical Consultants:    cardiology   Anti-Infectives:    None  Subjective:   Awake alert lying flat in bed.  Complains of left anterior chest "discomfort".  No nausea diaphoresis or shortness of breath.  Objective:    Vitals:   06/17/19 0830 06/17/19 0845 06/17/19 0900 06/17/19 0915  BP: 130/77 116/76 127/68 106/71  Pulse: 74 76 66 67  Resp:      Temp:      TempSrc:      SpO2: 98% 98% 99% 97%  Weight:      Height:        Intake/Output Summary (Last 24 hours) at 06/17/2019 0938 Last data filed at 06/16/2019 2305 Gross per 24 hour  Intake 0 ml  Output 0 ml  Net 0 ml   Filed Weights   06/16/19 2304  Weight: 111.1 kg    Exam: General: Awake obese no acute distress CV: Heart sounds are distant but regular I hear no murmur gallop or rub trace lower extremity edema.  Appears somewhat puffy in her hands and arms as well Respiratory no increased work of breathing breath sounds are distant but clear I hear no rhonchi no crackles Abdomen: Obese soft positive bowel sounds throughout no guarding or rebounding insulin pump intact Musculoskeletal: Joints without swelling/erythema full range of motion Neuro: Speech clear facial symmetry  bilateral grip 5 out of 5 no focal deficit  Data Reviewed:   I have personally reviewed following labs and imaging studies:  Labs: Labs show the following:   Basic Metabolic Panel: Recent Labs  Lab 06/16/19 1620 06/17/19 0500  NA 137 141  K 3.9 3.4*  CL 100 107  CO2 28 25  GLUCOSE 227* 173*  BUN 11 10  CREATININE 0.51 0.49  CALCIUM 9.3 8.0*   GFR Estimated Creatinine Clearance: 101.6 mL/min (by C-G formula based on SCr of 0.49 mg/dL). Liver Function Tests: No results for input(s): AST, ALT, ALKPHOS, BILITOT, PROT, ALBUMIN in the last 168 hours. No results for input(s): LIPASE, AMYLASE in the last 168 hours. No results for input(s): AMMONIA in the last 168 hours. Coagulation profile No results for input(s): INR, PROTIME in the last 168 hours.  CBC: Recent Labs  Lab 06/16/19 1620 06/17/19 0500  WBC 8.3 5.3  HGB 14.4 10.0*  HCT 42.1 30.2*  MCV 88.3 90.4  PLT 308 209   Cardiac Enzymes: No results for input(s): CKTOTAL, CKMB, CKMBINDEX, TROPONINI in the last 168 hours. BNP (last 3 results) No results for input(s): PROBNP in the last 8760 hours. CBG: Recent Labs  Lab 06/17/19 0336 06/17/19 0735  GLUCAP 214* 153*   D-Dimer: Recent Labs    06/16/19 2200  DDIMER 0.81*   Hgb A1c: No results for input(s): HGBA1C in the last 72 hours. Lipid Profile: No results for input(s): CHOL, HDL, LDLCALC, TRIG, CHOLHDL, LDLDIRECT in the last 72 hours. Thyroid function studies: No results for input(s): TSH, T4TOTAL, T3FREE, THYROIDAB in the last 72 hours.  Invalid input(s): FREET3 Anemia work up: No results for input(s): VITAMINB12, FOLATE, FERRITIN, TIBC, IRON, RETICCTPCT in the last 72 hours. Sepsis Labs: Recent Labs  Lab 06/16/19 1620 06/17/19 0500  WBC 8.3 5.3    Microbiology Recent Results (from the past 240 hour(s))  SARS CORONAVIRUS 2 (TAT 6-24 HRS) Nasopharyngeal Nasopharyngeal Swab     Status: None   Collection Time: 06/16/19 11:38 PM   Specimen:  Nasopharyngeal Swab  Result Value Ref Range Status   SARS Coronavirus 2 NEGATIVE NEGATIVE Final    Comment: (NOTE) SARS-CoV-2 target nucleic acids are NOT DETECTED. The SARS-CoV-2 RNA is generally detectable in upper and lower respiratory specimens during the acute phase of infection. Negative results do not preclude SARS-CoV-2 infection, do not rule out co-infections with other pathogens, and should not be used as the sole basis for treatment or other patient management decisions. Negative results must be combined with clinical observations, patient history, and epidemiological information. The expected result is Negative. Fact Sheet for Patients: HairSlick.no Fact Sheet for Healthcare Providers: quierodirigir.com This test is not yet approved or cleared by the [place] FDA and  has been authorized for detection and/or diagnosis of SARS-CoV-2 by FDA under an Emergency Use Authorization (EUA). This EUA will remain  in effect (meaning this test can  be used) for the duration of the COVID-19 declaration under Section 56 4(b)(1) of the Act, 21 U.S.C. section 360bbb-3(b)(1), unless the authorization is terminated or revoked sooner. Performed at [facility], [place] [address], [place], [place] [address]     Procedures and diagnostic studies:  DG Chest 2 View  Result Date: 06/16/2019 CLINICAL DATA:  Chest pain, neck pain, nausea, history MI EXAM: CHEST - 2 VIEW COMPARISON:  01/18/2018 FINDINGS: Normal heart size, mediastinal contours, and pulmonary vascularity. Atherosclerotic calcification aorta. Lungs clear. No pulmonary infiltrate, pleural effusion or pneumothorax. Bones unremarkable. IMPRESSION: No acute abnormalities. Aortic Atherosclerosis (ICD10-I70.0). Electronically Signed   By: Lavonia Dana M.D.   On: 06/16/2019 16:40   CT Angio Chest PE W and/or Wo Contrast  Result Date: 06/17/2019 CLINICAL DATA:  Neck pain, chest  pain, nausea EXAM: CT ANGIOGRAPHY CHEST WITH CONTRAST TECHNIQUE: Multidetector CT imaging of the chest was performed using the standard protocol during bolus administration of intravenous contrast. Multiplanar CT image reconstructions and MIPs were obtained to evaluate the vascular anatomy. CONTRAST:  53mL OMNIPAQUE IOHEXOL 350 MG/ML SOLN COMPARISON:  None. FINDINGS: Cardiovascular: Cardiomegaly. No filling defects in the pulmonary arteries to suggest pulmonary emboli. Heart is normal size. Diffuse coronary artery calcifications. Aortic atherosclerosis. Mediastinum/Nodes: No mediastinal, hilar, or axillary adenopathy. Trachea and esophagus are unremarkable. Thyroid unremarkable. Lungs/Pleura: Lungs are clear. No focal airspace opacities or suspicious nodules. No effusions. Upper Abdomen: Imaging into the upper abdomen shows no acute findings. Changes of prior gastric sleeve. Musculoskeletal: Chest wall soft tissues are unremarkable. No acute bony abnormality. Review of the MIP images confirms the above findings. IMPRESSION: No evidence of pulmonary embolus. Diffuse coronary artery disease, advanced for patient's age. Cardiomegaly. No acute cardiopulmonary disease. Aortic Atherosclerosis (ICD10-I70.0). Electronically Signed   By: Rolm Baptise M.D.   On: 06/17/2019 00:35    Medications:   . aspirin  81 mg Oral Daily  . atorvastatin  40 mg Oral q1800  . carvedilol  12.5 mg Oral q AM  . enoxaparin (LOVENOX) injection  40 mg Subcutaneous Q24H  . gabapentin  300 mg Oral QHS  . insulin pump   Subcutaneous TID WC, HS, 0200  . losartan  100 mg Oral Daily  . potassium chloride  40 mEq Oral Once  . sodium chloride flush  3 mL Intravenous Once   Continuous Infusions: . famotidine (PEPCID) IV Stopped (06/17/19 0117)     LOS: 0 days   Radene Gunning NP  [facility]   How to contact the [facility] Attending or Consulting provider [place] or covering provider during after hours [place], for this patient?   1. Check the care team in [facility] and look for a) attending/consulting [facility] provider listed and b) the [facility] team listed 2. Log into [email or web address] and use [place]'s universal password to access. If you do not have the password, please contact the hospital operator. 3. Locate the [facility] provider you are looking for under [facility] and page to a number that you can be directly reached. 4. If you still have difficulty reaching the provider, please page the [facility] (Director on Call) for the Hospitalists listed on amion for assistance.  06/17/2019, 9:38 AM

## 2019-06-17 NOTE — ED Notes (Signed)
Tele   Breakfast ordered  

## 2019-06-17 NOTE — Progress Notes (Signed)
  Echocardiogram 2D Echocardiogram has been performed.  Beverly [name] 06/17/2019, 1:42 PM

## 2019-06-17 NOTE — Progress Notes (Signed)
Inpatient Diabetes Program Recommendations  AACE/ADA: New Consensus Statement on Inpatient Glycemic Control (2015)  Target Ranges:  Prepandial:   less than 140 mg/dL      Peak postprandial:   less than 180 mg/dL (1-2 hours)      Critically ill patients:  140 - 180 mg/dL   Lab Results  Component Value Date   GLUCAP 147 (H) 06/17/2019   HGBA1C 10.2 (H) 06/17/2019    Review of Glycemic Control Results for VENOLA, CASTELLO (MRN [id number]) as of 06/17/2019 14:14  Ref. Range 06/17/2019 03:36 06/17/2019 07:35 06/17/2019 11:20  Glucose-Capillary Latest Ref Range: 70 - 99 mg/dL 595 (H) 638 (H) 756 (H)   Diabetes history: DM Outpatient Diabetes medications:  Insulin pump (patient states that she recently restarted insulin pump) Basal rate 1.5 units/ hr (total 24 hours basal=36 units) She states that she is unsure of her insulin to CHO ration/correction factor and that she uses the bolus wizard Current orders for Inpatient glycemic control:  Insulin pump  Inpatient Diabetes Program Recommendations:    Per patient she just recently restarted her insulin pump.  She had stopped using it in the past due to the high cost of supplies/tubing however was able to resume recently.  She see's PCP for insulin pump and settings.  She states that she does not have any supplies at the hospital. Advised patient to have family member bring her insulin pump supplies to the hospital.  She is due to change site on 06/18/19.  We can assist with providing insulin but do not have insulin pump supplies.  Patient verbalized understanding.  Note plans for cardiac cath.  She may disconnect immediately prior to cath and needs to resume as soon as cath is over.  Will follow.   Thanks  Beryl Meager, RN, BC-ADM Inpatient Diabetes Coordinator Pager [phone]

## 2019-06-17 NOTE — ED Notes (Signed)
cardiology at bedside

## 2019-06-17 NOTE — ED Notes (Signed)
Lunch Tray Ordered @ 1028. 

## 2019-06-17 NOTE — H&P (Signed)
History and Physical    Beverly [name]:[id number] DOB: April 04, 1975 DOA: 06/16/2019  PCP: [name], MD   Patient coming from: Home   Chief Complaint: Chest pain   HPI: Beverly [name] is a 44 y.o. female with medical history significant for CAD with stent, insulin-dependent diabetes mellitus, hypertension, history of bariatric surgery, and history of lower extremity DVT after surgery no longer anticoagulated, now presenting to emergency department for evaluation of chest pain.  The patient reports a few days of waxing and waning chest pain.  Symptoms initially felt more like indigestion but she began to experience an ache in her left jaw that she reports to be similar to when she had a heart attack and required stent.  She denies any associated shortness of breath and denies cough, fevers, or chills.  ED Course: Upon arrival to the ED, patient is found to be afebrile, saturating well on room air, and with stable blood pressure.  EKG features a sinus rhythm and chest x-rays negative for acute cardiopulmonary disease.  Chemistry panel notable for glucose 227 and CBC unremarkable.  High-sensitivity troponin is normal x2.  D-dimer is elevated to 0.81.  Patient was given GI cocktail in the ED.  Covid PCR screening test is pending.  Review of Systems:  All other systems reviewed and apart from HPI, are negative.  Past Medical History:  Diagnosis Date  . CAD (coronary artery disease) 06/16/2019  . Diabetes mellitus without complication (HCC)   . Hypertension   . Multiple thyroid nodules     Past Surgical History:  Procedure Laterality Date  . Cardiac Stent  2009     reports that she has never smoked. She has never used smokeless tobacco. She reports that she does not drink alcohol or use drugs.  Allergies  Allergen Reactions  . Shellfish-Derived Products Diarrhea  . Tape Other (See Comments)    Can blister the skin    History reviewed. No pertinent family history.   Prior to  Admission medications   Medication Sig Start Date End Date Taking? Authorizing Provider  aluminum-magnesium hydroxide-simethicone (MAALOX) 200-200-20 MG/5ML SUSP Take 30 mLs by mouth 3 (three) times daily as needed (for gas/indigestion).    Yes [provider]  aspirin 81 MG EC tablet Take 81 mg by mouth daily.    Yes [provider]  atorvastatin (LIPITOR) 40 MG tablet Take 40 mg by mouth at bedtime.  12/19/13  Yes [provider]  carvedilol (COREG) 12.5 MG tablet Take 12.5 mg by mouth in the morning.    Yes [provider]  gabapentin (NEURONTIN) 300 MG capsule Take 300 mg by mouth at bedtime.   Yes [provider]  ibuprofen (ADVIL) 200 MG tablet Take 600 mg by mouth every 6 (six) hours as needed (for pain).   Yes [provider]  Insulin Human (INSULIN PUMP) SOLN Inject 1 each into the skin continuous. MedTronic- Uses Humalog   Yes [provider]  insulin lispro (HUMALOG) 100 UNIT/ML injection Inject into the skin See admin instructions. For use in [employer] insulin pump   Yes [provider]  losartan (COZAAR) 100 MG tablet Take 100 mg by mouth daily.  01/12/14  Yes [provider]  Multiple Vitamins-Calcium (ONE-A-DAY WOMENS FORMULA) TABS Take 1 tablet by mouth daily with breakfast.   Yes [provider]  simethicone (GAS-X EXTRA STRENGTH) 125 MG chewable tablet Chew 125 mg by mouth every 6 (six) hours as needed for flatulence.  Yes [provider]    Physical Exam: Vitals:   06/16/19 2300 06/16/19 2304 06/16/19 2315 06/16/19 2345  BP: (!) 166/85  129/60 108/83  Pulse: 71  71 73  Resp: (!) 22  (!) 21 17  Temp:      TempSrc:      SpO2: 99%  99% 99%  Weight:  111.1 kg    Height:  5' (1.524 m)      Constitutional: NAD, calm  Eyes: PERTLA, lids and conjunctivae normal ENMT: Mucous membranes are moist. Posterior pharynx clear of any exudate or lesions.   Neck: normal, supple, no  masses, no thyromegaly Respiratory:  no wheezing, no crackles. No accessory muscle use.  Cardiovascular: S1 & S2 heard, regular rate and rhythm. Mild lower leg swelling bilaterally. Abdomen: No distension, no tenderness, soft. Bowel sounds active.  Musculoskeletal: no clubbing / cyanosis. No joint deformity upper and lower extremities.   Skin: no significant rashes, lesions, ulcers. Warm, dry, well-perfused. Neurologic: No gross facial asymmetry. Sensation intact. Moving all extremities.  Psychiatric: Alert and oriented to person, place, and situation. Calm, cooperative.    Labs and Imaging on Admission: I have personally reviewed following labs and imaging studies  CBC: Recent Labs  Lab 06/16/19 1620  WBC 8.3  HGB 14.4  HCT 42.1  MCV 88.3  PLT 010   Basic Metabolic Panel: Recent Labs  Lab 06/16/19 1620  NA 137  K 3.9  CL 100  CO2 28  GLUCOSE 227*  BUN 11  CREATININE 0.51  CALCIUM 9.3   GFR: Estimated Creatinine Clearance: 101.6 mL/min (by C-G formula based on SCr of 0.51 mg/dL). Liver Function Tests: No results for input(s): AST, ALT, ALKPHOS, BILITOT, PROT, ALBUMIN in the last 168 hours. No results for input(s): LIPASE, AMYLASE in the last 168 hours. No results for input(s): AMMONIA in the last 168 hours. Coagulation Profile: No results for input(s): INR, PROTIME in the last 168 hours. Cardiac Enzymes: No results for input(s): CKTOTAL, CKMB, CKMBINDEX, TROPONINI in the last 168 hours. BNP (last 3 results) No results for input(s): PROBNP in the last 8760 hours. HbA1C: No results for input(s): HGBA1C in the last 72 hours. CBG: No results for input(s): GLUCAP in the last 168 hours. Lipid Profile: No results for input(s): CHOL, HDL, LDLCALC, TRIG, CHOLHDL, LDLDIRECT in the last 72 hours. Thyroid Function Tests: No results for input(s): TSH, T4TOTAL, FREET4, T3FREE, THYROIDAB in the last 72 hours. Anemia Panel: No results for input(s): VITAMINB12, FOLATE,  FERRITIN, TIBC, IRON, RETICCTPCT in the last 72 hours. Urine analysis: No results found for: COLORURINE, APPEARANCEUR, LABSPEC, PHURINE, GLUCOSEU, HGBUR, BILIRUBINUR, KETONESUR, PROTEINUR, UROBILINOGEN, NITRITE, LEUKOCYTESUR Sepsis Labs: @LABRCNTIP (procalcitonin:4,lacticidven:4) )No results found for this or any previous visit (from the past 240 hour(s)).   Radiological Exams on Admission: DG Chest 2 View  Result Date: 06/16/2019 CLINICAL DATA:  Chest pain, neck pain, nausea, history MI EXAM: CHEST - 2 VIEW COMPARISON:  01/18/2018 FINDINGS: Normal heart size, mediastinal contours, and pulmonary vascularity. Atherosclerotic calcification aorta. Lungs clear. No pulmonary infiltrate, pleural effusion or pneumothorax. Bones unremarkable. IMPRESSION: No acute abnormalities. Aortic Atherosclerosis (ICD10-I70.0). Electronically Signed   By: Lavonia Dana M.D.   On: 06/16/2019 16:40    EKG: Independently reviewed. Sinus rhythm.   Assessment/Plan   1. Chest pain; CAD  - Presents with several days of waxing-waning chest discomfort similar to her prior MI, has no acute ischemic changes on EKG, HS troponin is normal x2, no acute findings on CXR  - D-dimer  is 0.81 in ED  - Improved with GI cocktail  - Stop Advil, continue as-needed GI cocktail, check CTA chest, repeat EKG, continue ASA, statin, beta-blocker, and ARB    2. Insulin-dependent DM  - Continue CBG checks, insulin    3. Hypertension  - BP at goal, continue Coreg and losartan     DVT prophylaxis: Lovenox  Code Status: Full  Family Communication: Discussed with patient  Disposition Plan: likely home in 24-48 hours after CTA chest and repeat EKG Consults called: None Admission status: Observation     Briscoe Deutscher, MD [facility] Pager: See [email or web address]  If 7AM-7PM, please contact the daytime attending [email or web address]  06/17/2019, 12:02 AM

## 2019-06-17 NOTE — ED Notes (Signed)
Pt transported to CT ?

## 2019-06-17 NOTE — ED Notes (Signed)
Manual BP taken by RN per PA request. BP 108/68, Mia, PA notified.

## 2019-06-18 ENCOUNTER — Inpatient Hospital Stay (HOSPITAL_COMMUNITY)
Admission: EM | Disposition: A | Discharge status: Home / Self Care | Payer: Self-pay | Source: Home / Self Care | Attending: Thoracic Surgery (Cardiothoracic Vascular Surgery)

## 2019-06-18 ENCOUNTER — Inpatient Hospital Stay (HOSPITAL_COMMUNITY): Payer: 59

## 2019-06-18 DIAGNOSIS — E872 Acidosis: Secondary | ICD-10-CM | POA: Diagnosis not present

## 2019-06-18 DIAGNOSIS — I10 Essential (primary) hypertension: Secondary | ICD-10-CM | POA: Diagnosis not present

## 2019-06-18 DIAGNOSIS — Z955 Presence of coronary angioplasty implant and graft: Secondary | ICD-10-CM | POA: Diagnosis not present

## 2019-06-18 DIAGNOSIS — Z9641 Presence of insulin pump (external) (internal): Secondary | ICD-10-CM | POA: Diagnosis present

## 2019-06-18 DIAGNOSIS — R079 Chest pain, unspecified: Secondary | ICD-10-CM | POA: Diagnosis present

## 2019-06-18 DIAGNOSIS — Z9884 Bariatric surgery status: Secondary | ICD-10-CM | POA: Diagnosis not present

## 2019-06-18 DIAGNOSIS — I8391 Asymptomatic varicose veins of right lower extremity: Secondary | ICD-10-CM | POA: Diagnosis present

## 2019-06-18 DIAGNOSIS — I2582 Chronic total occlusion of coronary artery: Secondary | ICD-10-CM | POA: Diagnosis not present

## 2019-06-18 DIAGNOSIS — I200 Unstable angina: Secondary | ICD-10-CM | POA: Diagnosis not present

## 2019-06-18 DIAGNOSIS — E119 Type 2 diabetes mellitus without complications: Secondary | ICD-10-CM | POA: Diagnosis not present

## 2019-06-18 DIAGNOSIS — Z01810 Encounter for preprocedural cardiovascular examination: Secondary | ICD-10-CM

## 2019-06-18 DIAGNOSIS — I119 Hypertensive heart disease without heart failure: Secondary | ICD-10-CM | POA: Diagnosis not present

## 2019-06-18 DIAGNOSIS — Z9111 Patient's noncompliance with dietary regimen: Secondary | ICD-10-CM | POA: Diagnosis not present

## 2019-06-18 DIAGNOSIS — D62 Acute posthemorrhagic anemia: Secondary | ICD-10-CM | POA: Diagnosis not present

## 2019-06-18 DIAGNOSIS — E1151 Type 2 diabetes mellitus with diabetic peripheral angiopathy without gangrene: Secondary | ICD-10-CM | POA: Diagnosis not present

## 2019-06-18 DIAGNOSIS — E785 Hyperlipidemia, unspecified: Secondary | ICD-10-CM | POA: Diagnosis present

## 2019-06-18 DIAGNOSIS — Z20822 Contact with and (suspected) exposure to covid-19: Secondary | ICD-10-CM | POA: Diagnosis present

## 2019-06-18 DIAGNOSIS — Z6841 Body Mass Index (BMI) 40.0 and over, adult: Secondary | ICD-10-CM

## 2019-06-18 DIAGNOSIS — E876 Hypokalemia: Secondary | ICD-10-CM | POA: Diagnosis not present

## 2019-06-18 DIAGNOSIS — R197 Diarrhea, unspecified: Secondary | ICD-10-CM | POA: Diagnosis not present

## 2019-06-18 DIAGNOSIS — J9811 Atelectasis: Secondary | ICD-10-CM | POA: Diagnosis not present

## 2019-06-18 DIAGNOSIS — I700 Atherosclerosis of aorta: Secondary | ICD-10-CM | POA: Diagnosis present

## 2019-06-18 DIAGNOSIS — I25110 Atherosclerotic heart disease of native coronary artery with unstable angina pectoris: Principal | ICD-10-CM

## 2019-06-18 DIAGNOSIS — I252 Old myocardial infarction: Secondary | ICD-10-CM | POA: Diagnosis not present

## 2019-06-18 DIAGNOSIS — E8770 Fluid overload, unspecified: Secondary | ICD-10-CM | POA: Diagnosis not present

## 2019-06-18 DIAGNOSIS — I340 Nonrheumatic mitral (valve) insufficiency: Secondary | ICD-10-CM | POA: Diagnosis present

## 2019-06-18 DIAGNOSIS — Z86718 Personal history of other venous thrombosis and embolism: Secondary | ICD-10-CM | POA: Diagnosis not present

## 2019-06-18 DIAGNOSIS — E282 Polycystic ovarian syndrome: Secondary | ICD-10-CM | POA: Diagnosis present

## 2019-06-18 HISTORY — PX: LEFT HEART CATH AND CORONARY ANGIOGRAPHY: CATH118249

## 2019-06-18 LAB — URINALYSIS, ROUTINE W REFLEX MICROSCOPIC
Bilirubin Urine: NEGATIVE
Glucose, UA: >=500 mg/dL — AB
Ketones, ur: NEGATIVE mg/dL
Leukocytes,Ua: NEGATIVE
Nitrite: NEGATIVE
Protein, ur: NEGATIVE mg/dL
Specific Gravity, Urine: 1.007 (ref 1.005–1.030)
pH: 7 (ref 5.0–8.0)

## 2019-06-18 LAB — POCT ACTIVATED CLOTTING TIME: Activated Clotting Time: 120 seconds

## 2019-06-18 LAB — CBC
HCT: 39 % (ref 36.0–46.0)
Hemoglobin: 13.5 g/dL (ref 12.0–15.0)
MCH: 30.3 pg (ref 26.0–34.0)
MCHC: 34.6 g/dL (ref 30.0–36.0)
MCV: 87.4 fL (ref 80.0–100.0)
Platelets: 278 10*3/uL (ref 150–400)
RBC: 4.46 MIL/uL (ref 3.87–5.11)
RDW: 12.6 % (ref 11.5–15.5)
WBC: 8.6 10*3/uL (ref 4.0–10.5)
nRBC: 0 % (ref 0.0–0.2)

## 2019-06-18 LAB — BASIC METABOLIC PANEL
Anion gap: 8 (ref 5–15)
BUN: 13 mg/dL (ref 6–20)
CO2: 25 mmol/L (ref 22–32)
Calcium: 8.8 mg/dL — ABNORMAL LOW (ref 8.9–10.3)
Chloride: 105 mmol/L (ref 98–111)
Creatinine, Ser: 0.5 mg/dL (ref 0.44–1.00)
GFR calc Af Amer: >60 mL/min (ref >60)
GFR calc non Af Amer: >60 mL/min (ref >60)
Glucose, Bld: 198 mg/dL — ABNORMAL HIGH (ref 70–99)
Potassium: 3.9 mmol/L (ref 3.5–5.1)
Sodium: 138 mmol/L (ref 135–145)

## 2019-06-18 LAB — GLUCOSE, CAPILLARY
Glucose-Capillary: 173 mg/dL — ABNORMAL HIGH (ref 70–99)
Glucose-Capillary: 151 mg/dL — ABNORMAL HIGH (ref 70–99)
Glucose-Capillary: 157 mg/dL — ABNORMAL HIGH (ref 70–99)
Glucose-Capillary: 180 mg/dL — ABNORMAL HIGH (ref 70–99)
Glucose-Capillary: 140 mg/dL — ABNORMAL HIGH (ref 70–99)
Glucose-Capillary: 134 mg/dL — ABNORMAL HIGH (ref 70–99)
Glucose-Capillary: 231 mg/dL — ABNORMAL HIGH (ref 70–99)

## 2019-06-18 LAB — SURGICAL PCR SCREEN
MRSA, PCR: NEGATIVE
Staphylococcus aureus: NEGATIVE

## 2019-06-18 LAB — ABO/RH: ABO/RH(D): O NEG

## 2019-06-18 SURGERY — LEFT HEART CATH AND CORONARY ANGIOGRAPHY
Anesthesia: LOCAL

## 2019-06-18 MED ORDER — SODIUM CHLORIDE 0.9 % IV SOLN
INTRAVENOUS | Status: DC
  Filled 2019-06-18: qty 30

## 2019-06-18 MED ORDER — SODIUM CHLORIDE 0.9 % WEIGHT BASED INFUSION
3.0000 mL/kg/h | INTRAVENOUS | Status: DC
  Administered 2019-06-18: 3 mL/kg/h via INTRAVENOUS

## 2019-06-18 MED ORDER — MILRINONE LACTATE IN DEXTROSE 20-5 MG/100ML-% IV SOLN
0.3000 ug/kg/min | INTRAVENOUS | Status: DC
  Filled 2019-06-18 (×2): qty 100

## 2019-06-18 MED ORDER — HEPARIN (PORCINE) IN NACL 1000-0.9 UT/500ML-% IV SOLN
INTRAVENOUS | Status: DC | PRN
  Administered 2019-06-18 (×2): 500 mL

## 2019-06-18 MED ORDER — IOHEXOL 350 MG/ML SOLN
INTRAVENOUS | Status: DC | PRN
  Administered 2019-06-18: 60 mL

## 2019-06-18 MED ORDER — LABETALOL HCL 5 MG/ML IV SOLN: 10.0000 mg | INTRAVENOUS | Status: AC | PRN

## 2019-06-18 MED ORDER — CHLORHEXIDINE GLUCONATE CLOTH 2 % EX PADS
6.0000 | MEDICATED_PAD | Freq: Once | CUTANEOUS | Status: AC
  Administered 2019-06-18: 23:00:00 6 via TOPICAL

## 2019-06-18 MED ORDER — VANCOMYCIN HCL 10 G IV SOLR
1500.0000 mg | INTRAVENOUS | Status: DC
  Filled 2019-06-18: qty 1500

## 2019-06-18 MED ORDER — MIDAZOLAM HCL 2 MG/2ML IJ SOLN
INTRAMUSCULAR | Status: AC
  Filled 2019-06-18: qty 2

## 2019-06-18 MED ORDER — ASPIRIN 81 MG PO CHEW
81.0000 mg | CHEWABLE_TABLET | ORAL | Status: AC
  Administered 2019-06-18: 05:00:00 81 mg via ORAL
  Filled 2019-06-18: qty 1

## 2019-06-18 MED ORDER — EPINEPHRINE HCL 5 MG/250ML IV SOLN IN NS
0.0000 ug/min | INTRAVENOUS | Status: DC
  Filled 2019-06-18 (×2): qty 250

## 2019-06-18 MED ORDER — HEPARIN SODIUM (PORCINE) 1000 UNIT/ML IJ SOLN
INTRAMUSCULAR | Status: AC
  Filled 2019-06-18: qty 1

## 2019-06-18 MED ORDER — MAGNESIUM SULFATE 50 % IJ SOLN
40.0000 meq | INTRAMUSCULAR | Status: DC
  Filled 2019-06-18: qty 9.85

## 2019-06-18 MED ORDER — POTASSIUM CHLORIDE 2 MEQ/ML IV SOLN
80.0000 meq | INTRAVENOUS | Status: DC
  Filled 2019-06-18: qty 40

## 2019-06-18 MED ORDER — LIDOCAINE HCL (PF) 1 % IJ SOLN
INTRAMUSCULAR | Status: DC | PRN
  Administered 2019-06-18: 20 mL
  Administered 2019-06-18: 2 mL

## 2019-06-18 MED ORDER — INSULIN ASPART 100 UNIT/ML ~~LOC~~ SOLN: 0.0000 [IU] | Freq: Once | SUBCUTANEOUS | Status: DC

## 2019-06-18 MED ORDER — FENTANYL CITRATE (PF) 100 MCG/2ML IJ SOLN
INTRAMUSCULAR | Status: DC | PRN
  Administered 2019-06-18 (×2): 25 ug via INTRAVENOUS

## 2019-06-18 MED ORDER — TEMAZEPAM 15 MG PO CAPS
15.0000 mg | ORAL_CAPSULE | Freq: Once | ORAL | Status: AC | PRN
  Administered 2019-06-18: 23:00:00 15 mg via ORAL
  Filled 2019-06-18: qty 1

## 2019-06-18 MED ORDER — TRANEXAMIC ACID (OHS) PUMP PRIME SOLUTION
2.0000 mg/kg | INTRAVENOUS | Status: DC
  Filled 2019-06-18: qty 2.22

## 2019-06-18 MED ORDER — SODIUM CHLORIDE 0.9% FLUSH: 3.0000 mL | Freq: Two times a day (BID) | INTRAVENOUS | Status: DC

## 2019-06-18 MED ORDER — FENTANYL CITRATE (PF) 100 MCG/2ML IJ SOLN
INTRAMUSCULAR | Status: AC
  Filled 2019-06-18: qty 2

## 2019-06-18 MED ORDER — SODIUM CHLORIDE 0.9 % IV SOLN: INTRAVENOUS | Status: AC

## 2019-06-18 MED ORDER — EPINEPHRINE HCL 5 MG/250ML IV SOLN IN NS
0.0000 ug/min | INTRAVENOUS | Status: DC
  Filled 2019-06-18: qty 250

## 2019-06-18 MED ORDER — SODIUM CHLORIDE 0.9% FLUSH: 3.0000 mL | INTRAVENOUS | Status: DC | PRN

## 2019-06-18 MED ORDER — TRANEXAMIC ACID (OHS) BOLUS VIA INFUSION
15.0000 mg/kg | INTRAVENOUS | Status: DC
  Filled 2019-06-18: qty 1667

## 2019-06-18 MED ORDER — BISACODYL 5 MG PO TBEC
5.0000 mg | DELAYED_RELEASE_TABLET | Freq: Once | ORAL | Status: AC
  Administered 2019-06-18: 23:00:00 5 mg via ORAL
  Filled 2019-06-18: qty 1

## 2019-06-18 MED ORDER — DEXMEDETOMIDINE HCL IN NACL 400 MCG/100ML IV SOLN
0.1000 ug/kg/h | INTRAVENOUS | Status: AC
  Administered 2019-06-19: .3 ug/kg/h via INTRAVENOUS
  Filled 2019-06-18: qty 100

## 2019-06-18 MED ORDER — ATORVASTATIN CALCIUM 80 MG PO TABS
80.0000 mg | ORAL_TABLET | Freq: Every day | ORAL | Status: DC
  Administered 2019-06-18 – 2019-06-23 (×6): 80 mg via ORAL
  Filled 2019-06-18 (×6): qty 1

## 2019-06-18 MED ORDER — CHLORHEXIDINE GLUCONATE CLOTH 2 % EX PADS
6.0000 | MEDICATED_PAD | Freq: Once | CUTANEOUS | Status: AC
  Administered 2019-06-19: 6 via TOPICAL

## 2019-06-18 MED ORDER — PLASMA-LYTE 148 IV SOLN
INTRAVENOUS | Status: DC
  Filled 2019-06-18: qty 2.5

## 2019-06-18 MED ORDER — SODIUM CHLORIDE 0.9 % IV SOLN: 250.0000 mL | INTRAVENOUS | Status: DC | PRN

## 2019-06-18 MED ORDER — INSULIN ASPART 100 UNIT/ML ~~LOC~~ SOLN
300.0000 [IU] | Freq: Once | SUBCUTANEOUS | Status: AC
  Administered 2019-06-18: 300 [IU] via SUBCUTANEOUS
  Filled 2019-06-18 (×2): qty 10

## 2019-06-18 MED ORDER — CHLORHEXIDINE GLUCONATE 0.12 % MT SOLN
15.0000 mL | Freq: Once | OROMUCOSAL | Status: AC
  Administered 2019-06-19: 15 mL via OROMUCOSAL
  Filled 2019-06-18: qty 15

## 2019-06-18 MED ORDER — SODIUM CHLORIDE 0.9 % IV SOLN
1.5000 g | INTRAVENOUS | Status: DC
  Filled 2019-06-18: qty 1.5

## 2019-06-18 MED ORDER — SODIUM CHLORIDE 0.9 % IV SOLN
750.0000 mg | INTRAVENOUS | Status: DC
  Filled 2019-06-18: qty 750

## 2019-06-18 MED ORDER — NITROGLYCERIN IN D5W 200-5 MCG/ML-% IV SOLN
2.0000 ug/min | INTRAVENOUS | Status: AC
  Administered 2019-06-19: 5 ug/min via INTRAVENOUS
  Filled 2019-06-18: qty 250

## 2019-06-18 MED ORDER — DEXMEDETOMIDINE HCL IN NACL 400 MCG/100ML IV SOLN
0.1000 ug/kg/h | INTRAVENOUS | Status: DC
  Filled 2019-06-18 (×2): qty 100

## 2019-06-18 MED ORDER — DIAZEPAM 2 MG PO TABS
2.0000 mg | ORAL_TABLET | Freq: Once | ORAL | Status: AC
  Administered 2019-06-19: 06:00:00 2 mg via ORAL
  Filled 2019-06-18: qty 1

## 2019-06-18 MED ORDER — PLASMA-LYTE 148 IV SOLN
INTRAVENOUS | Status: AC
  Administered 2019-06-19: 500 mL
  Filled 2019-06-18: qty 2.5

## 2019-06-18 MED ORDER — SODIUM CHLORIDE 0.9 % WEIGHT BASED INFUSION
1.0000 mL/kg/h | INTRAVENOUS | Status: DC
  Administered 2019-06-18: 05:00:00 1 mL/kg/h via INTRAVENOUS

## 2019-06-18 MED ORDER — FAMOTIDINE 20 MG PO TABS
20.0000 mg | ORAL_TABLET | Freq: Two times a day (BID) | ORAL | Status: DC
  Administered 2019-06-18: 23:00:00 20 mg via ORAL
  Filled 2019-06-18: qty 1

## 2019-06-18 MED ORDER — TRANEXAMIC ACID 1000 MG/10ML IV SOLN
1.5000 mg/kg/h | INTRAVENOUS | Status: AC
  Administered 2019-06-19: 09:00:00 1.5 mg/kg/h via INTRAVENOUS
  Filled 2019-06-18: qty 25

## 2019-06-18 MED ORDER — NOREPINEPHRINE 4 MG/250ML-% IV SOLN
0.0000 ug/min | INTRAVENOUS | Status: DC
  Filled 2019-06-18: qty 250

## 2019-06-18 MED ORDER — MIDAZOLAM HCL 2 MG/2ML IJ SOLN
INTRAMUSCULAR | Status: DC | PRN
  Administered 2019-06-18: 1 mg via INTRAVENOUS
  Administered 2019-06-18: 2 mg via INTRAVENOUS
  Administered 2019-06-18: 1 mg via INTRAVENOUS

## 2019-06-18 MED ORDER — VANCOMYCIN HCL 10 G IV SOLR
1500.0000 mg | INTRAVENOUS | Status: AC
  Administered 2019-06-19: 07:00:00 1500 mg via INTRAVENOUS
  Filled 2019-06-18: qty 1500

## 2019-06-18 MED ORDER — TRANEXAMIC ACID (OHS) BOLUS VIA INFUSION
15.0000 mg/kg | INTRAVENOUS | Status: AC
  Administered 2019-06-19: 1666.5 mg via INTRAVENOUS
  Filled 2019-06-18: qty 1667

## 2019-06-18 MED ORDER — LIDOCAINE HCL (PF) 1 % IJ SOLN
INTRAMUSCULAR | Status: AC
  Filled 2019-06-18: qty 30

## 2019-06-18 MED ORDER — NITROGLYCERIN IN D5W 200-5 MCG/ML-% IV SOLN
2.0000 ug/min | INTRAVENOUS | Status: DC
  Filled 2019-06-18: qty 250

## 2019-06-18 MED ORDER — VANCOMYCIN HCL 1500 MG/300ML IV SOLN
1500.0000 mg | Freq: Once | INTRAVENOUS | Status: DC
  Filled 2019-06-18 (×2): qty 300

## 2019-06-18 MED ORDER — MILRINONE LACTATE IN DEXTROSE 20-5 MG/100ML-% IV SOLN
0.3000 ug/kg/min | INTRAVENOUS | Status: DC
  Filled 2019-06-18: qty 100

## 2019-06-18 MED ORDER — INSULIN REGULAR(HUMAN) IN NACL 100-0.9 UT/100ML-% IV SOLN
INTRAVENOUS | Status: DC
  Filled 2019-06-18 (×2): qty 100

## 2019-06-18 MED ORDER — TRANEXAMIC ACID 1000 MG/10ML IV SOLN
1.5000 mg/kg/h | INTRAVENOUS | Status: DC
  Filled 2019-06-18: qty 25

## 2019-06-18 MED ORDER — INSULIN REGULAR(HUMAN) IN NACL 100-0.9 UT/100ML-% IV SOLN
INTRAVENOUS | Status: AC
  Administered 2019-06-19: 1 [IU]/h via INTRAVENOUS
  Filled 2019-06-18: qty 100

## 2019-06-18 MED ORDER — VERAPAMIL HCL 2.5 MG/ML IV SOLN
INTRAVENOUS | Status: AC
  Filled 2019-06-18: qty 2

## 2019-06-18 MED ORDER — PHENYLEPHRINE HCL-NACL 20-0.9 MG/250ML-% IV SOLN
30.0000 ug/min | INTRAVENOUS | Status: DC
  Filled 2019-06-18 (×2): qty 250

## 2019-06-18 MED ORDER — SODIUM CHLORIDE 0.9 % IV SOLN
1.5000 g | INTRAVENOUS | Status: AC
  Administered 2019-06-19: 1.5 g via INTRAVENOUS
  Filled 2019-06-18: qty 1.5

## 2019-06-18 MED ORDER — HEPARIN (PORCINE) IN NACL 1000-0.9 UT/500ML-% IV SOLN
INTRAVENOUS | Status: AC
  Filled 2019-06-18: qty 1000

## 2019-06-18 MED ORDER — PHENYLEPHRINE HCL-NACL 20-0.9 MG/250ML-% IV SOLN
30.0000 ug/min | INTRAVENOUS | Status: AC
  Administered 2019-06-19: 30 ug/min via INTRAVENOUS
  Filled 2019-06-18: qty 250

## 2019-06-18 MED ORDER — HYDRALAZINE HCL 20 MG/ML IJ SOLN: 10.0000 mg | INTRAMUSCULAR | Status: AC | PRN

## 2019-06-18 MED ORDER — ALPRAZOLAM 0.25 MG PO TABS: 0.2500 mg | ORAL_TABLET | ORAL | Status: DC | PRN

## 2019-06-18 MED FILL — Insulin Aspart Inj 100 Unit/ML: SUBCUTANEOUS | Qty: 10 | Status: AC

## 2019-06-18 SURGICAL SUPPLY — 15 items
CATH INFINITI 5FR MULTPACK ANG (CATHETERS) ×1 IMPLANT
CATH OPTITORQUE TIG 4.0 5F (CATHETERS) ×1 IMPLANT
DEVICE RAD COMP TR BAND LRG (VASCULAR PRODUCTS) ×1 IMPLANT
GLIDESHEATH SLEND SS 6F .021 (SHEATH) ×1 IMPLANT
GUIDEWIRE INQWIRE 1.5J.035X260 (WIRE) IMPLANT
INQWIRE 1.5J .035X260CM (WIRE) ×2
KIT HEART LEFT (KITS) ×2 IMPLANT
PACK CARDIAC CATHETERIZATION (CUSTOM PROCEDURE TRAY) ×2 IMPLANT
SHEATH GLIDE SLENDER 4/5FR (SHEATH) ×1 IMPLANT
SHEATH PINNACLE 5F 10CM (SHEATH) ×1 IMPLANT
SYR MEDRAD MARK 7 150ML (SYRINGE) ×2 IMPLANT
TRANSDUCER W/STOPCOCK (MISCELLANEOUS) ×2 IMPLANT
TUBING ART PRESS 72  MALE/FEM (TUBING) ×2
TUBING ART PRESS 72 MALE/FEM (TUBING) IMPLANT
TUBING CIL FLEX 10 FLL-RA (TUBING) ×2 IMPLANT

## 2019-06-18 NOTE — Consult Note (Addendum)
[address]       [place] [address]             [phone]        Beverly [name] [facility] Medical Record #[id number] Date of Birth: 09/17/1975  Referring: No ref. provider found Primary Care: Bonnita Nasuti, MD Primary Cardiologist:No primary care provider on file.  Chief Complaint: chest pain with nausea and vomiting  History of Present Illness:    The patient is a 44 year old female with a history of coronary artery disease status post previous stent placement in 2009 who presented to the emergency department on 06/16/2019 with complaints of chest pain.  She reports that she has had a couple days of waxing and waning chest pain that initially she thought were's indigestion but ultimately began to experience aching in her left jaw which she felt was similar to her previous chest pain when she had a myocardial infarction and required stent placement.  She also had  associated shortness of breath.  Initial EKG showed no ischemic changes but did have inferior Qwaves.  And high-sensitivity troponin was normal x2.  Initial D-dimer was 0.81 in the emergency department.  Her symptoms did improve initially with a GI cocktail.  A chest CT angio showed no evidence of pulmonary embolus.  It did show advanced coronary artery disease.  Also revealed cardiomegaly but no acute cardiopulmonary disease.  There was aortic atherosclerosis.  The patient has other significant cardiac risk factors including insulin-dependent diabetes, hyperlipidemia, hypertension, and morbid obesity.  She does have a history of bariatric surgery in 2015.  She initially lost about 70 pounds but has gained back.  COVID-19 screen was negative.  Cardiology consultation was obtained.  She has been lost to cardiology follow-up since 2015 but did see Dr. Agustin Cree in the past.  Echocardiogram was done and this showed ejection fraction of 55%.  Please see the full report below.  Cardiac catheterization was performed on  today's date and the full report is currently pending.  We have been asked to see the patient in cardiothoracic surgical consultation for consideration of CABG.    Current Activity/ Functional Status: Patient is independent with mobility/ambulation, transfers, ADL's, IADL's.   Zubrod Score: At the time of surgery this patient's most appropriate activity status/level should be described as: []     0    Normal activity, no symptoms [x]     1    Restricted in physical strenuous activity but ambulatory, able to do out light work []     2    Ambulatory and capable of self care, unable to do work activities, up and about                 more than 50%  Of the time                            []     3    Only limited self care, in bed greater than 50% of waking hours []     4    Completely disabled, no self care, confined to bed or chair []     5    Moribund  Past Medical History:  Diagnosis Date  . CAD (coronary artery disease) 06/16/2019  . Diabetes mellitus without complication ([place])   . Hypertension   . Multiple thyroid nodules   . Obesity     Past Surgical History:  Procedure Laterality Date  .  Cardiac Stent  2009    Social History   Tobacco Use  Smoking Status Never Smoker  Smokeless Tobacco Never Used    Social History   Substance and Sexual Activity  Alcohol Use Never     Allergies  Allergen Reactions  . Shellfish-Derived Products Diarrhea  . Tape Other (See Comments)    Can blister the skin    Current Facility-Administered Medications  Medication Dose Route Frequency Provider Last Rate Last Admin  . 0.9% sodium chloride infusion  1 mL/kg/hr Intravenous Continuous [name], MD 111.1 mL/hr at 06/18/19 0525 1 mL/kg/hr at 06/18/19 0525  . [MAR Hold] acetaminophen (TYLENOL) tablet 650 mg  650 mg Oral Q4H PRN [name], Lavone Neri, MD      . Mitzi Hansen Hold] alum & mag hydroxide-simeth (MAALOX/MYLANTA) 200-200-20 MG/5ML suspension 30 mL  30 mL Oral Q6H PRN [name], Lavone Neri, MD    30 mL at 06/17/19 1956  . [MAR Hold] aspirin chewable tablet 81 mg  81 mg Oral Daily [name], Lavone Neri, MD   81 mg at 06/18/19 0831  . [MAR Hold] atorvastatin (LIPITOR) tablet 80 mg  80 mg Oral q1800 Arty Baumgartner, NP      . Mitzi Hansen Hold] carvedilol (COREG) tablet 12.5 mg  12.5 mg Oral q AM [name], Lavone Neri, MD   12.5 mg at 06/18/19 0454  . [MAR Hold] enoxaparin (LOVENOX) injection 40 mg  40 mg Subcutaneous Q24H [name], Lavone Neri, MD   40 mg at 06/17/19 2211  . [MAR Hold] famotidine (PEPCID) tablet 20 mg  20 mg Oral BID Arty Baumgartner, NP      . Mitzi Hansen Hold] gabapentin (NEURONTIN) capsule 300 mg  300 mg Oral QHS [name], Lavone Neri, MD   300 mg at 06/17/19 2209  . [MAR Hold] insulin pump   Subcutaneous TID WC, HS, 0200 [name], Lavone Neri, MD   Given at 06/18/19 1222  . [MAR Hold] losartan (COZAAR) tablet 100 mg  100 mg Oral Daily [name], Lavone Neri, MD   100 mg at 06/18/19 0830  . [MAR Hold] nitroGLYCERIN (NITROSTAT) SL tablet 0.4 mg  0.4 mg Sublingual Q5 Min x 3 PRN [name], Lavone Neri, MD      . Mitzi Hansen Hold] ondansetron (ZOFRAN) injection 4 mg  4 mg Intravenous Q6H PRN [name], Lavone Neri, MD      . Mitzi Hansen Hold] sodium chloride flush (NS) 0.9 % injection 3 mL  3 mL Intravenous Once [name], Lavone Neri, MD        Medications Prior to Admission  Medication Sig Dispense Refill Last Dose  . aluminum-magnesium hydroxide-simethicone (MAALOX) 200-200-20 MG/5ML SUSP Take 30 mLs by mouth 3 (three) times daily as needed (for gas/indigestion).    unk at [employer]  . aspirin 81 MG EC tablet Take 81 mg by mouth daily.    06/16/2019 at 0630  . atorvastatin (LIPITOR) 40 MG tablet Take 40 mg by mouth at bedtime.    06/15/2019 at pm  . carvedilol (COREG) 12.5 MG tablet Take 12.5 mg by mouth in the morning.    06/16/2019 at 0630  . gabapentin (NEURONTIN) 300 MG capsule Take 300 mg by mouth at bedtime.   06/15/2019 at pm  . ibuprofen (ADVIL) 200 MG tablet Take 600 mg by mouth every 6 (six) hours as needed (for pain).   06/14/2019 at Unknown time  .  Insulin Human (INSULIN PUMP) SOLN Inject 1 each into the skin continuous. [employer]- Uses Humalog   Continuous at Continuous  .  insulin lispro (HUMALOG) 100 UNIT/ML injection Inject into the skin See admin instructions. For use in [employer] insulin pump   Continuous at Continuous  . losartan (COZAAR) 100 MG tablet Take 100 mg by mouth daily.    06/16/2019 at am  . Multiple Vitamins-Calcium (ONE-A-DAY WOMENS FORMULA) TABS Take 1 tablet by mouth daily with breakfast.   06/16/2019 at am  . simethicone (GAS-X EXTRA STRENGTH) 125 MG chewable tablet Chew 125 mg by mouth every 6 (six) hours as needed for flatulence.   unk at unk    Family History  Problem Relation Age of Onset  . Hypertension Mother   . Diabetes Mellitus II Mother   . Hypertension Father   . Diabetes Mellitus II Father   . Heart disease Father        ?pacemaker vs loop recorder?  . Heart disease Maternal Grandmother        CABGx 4     Review of Systems:   Review of Systems  Constitutional: Positive for chills and malaise/fatigue. Negative for diaphoresis, fever and weight loss.  HENT: Negative for congestion, ear discharge, ear pain, hearing loss, nosebleeds, sinus pain, sore throat and tinnitus.   Eyes: Negative for blurred vision, double vision, photophobia, pain, discharge and redness.  Respiratory: Positive for cough and shortness of breath. Negative for hemoptysis, sputum production, wheezing and stridor.   Cardiovascular: Positive for chest pain, orthopnea and leg swelling. Negative for palpitations, claudication and PND.  Gastrointestinal: Positive for abdominal pain, diarrhea, heartburn, nausea and vomiting. Negative for blood in stool, constipation and melena.  Genitourinary: Positive for frequency and urgency. Negative for dysuria, flank pain and hematuria.  Musculoskeletal: Positive for back pain, joint pain and neck pain. Negative for falls and myalgias.  Skin: Negative.   Neurological: Positive for tingling, loss  of consciousness and weakness. Negative for dizziness, tremors, sensory change, speech change, focal weakness, seizures and headaches.       Hand weakness, drops things frequently  Endo/Heme/Allergies: Positive for environmental allergies and polydipsia. Bruises/bleeds easily.  Psychiatric/Behavioral: Positive for depression. Negative for hallucinations, memory loss, substance abuse and suicidal ideas. The patient is nervous/anxious. The patient does not have insomnia.     Physical Exam: BP (!) 155/87   Pulse 84   Temp 98.2 F (36.8 C) (Oral)   Resp 12   Ht 5' (1.524 m)   Wt 111.1 kg   SpO2 100%   BMI 47.85 kg/m   Physical Exam  Constitutional: No distress.  Morbidly obese adult female, in no acute distress  HENT:  Nose: No nasal discharge.  Mouth/Throat: Dentition is normal. No dental caries. Oropharynx is clear. Pharynx is normal.  No obvious dental caries  Eyes: Pupils are equal, round, and reactive to light. Conjunctivae are normal.  Neck: Thyroid normal. No JVD present. No neck adenopathy. No thyromegaly present.  Cardiovascular: Normal rate, regular rhythm, S1 normal, S2 normal and normal heart sounds. Exam reveals no gallop.  No murmur heard. Pulses:      Dorsalis pedis pulses are 0 on the right side and 0 on the left side.       Posterior tibial pulses are 0 on the right side and 0 on the left side.  No carotid bruits  Notable for spider veins but no definite varicosities  Pulmonary/Chest: Breath sounds normal. She has no wheezes. She has no rales. She exhibits no tenderness.  Abdominal: Soft. Bowel sounds are normal. She exhibits no distension and no mass. There is no abdominal tenderness.  obese  Musculoskeletal:        General: No tenderness or deformity.     Cervical back: Normal range of motion and neck supple.     Comments: Trace edema   Neurological: She is alert and oriented to person, place, and time.  Gait not tested  Skin: Skin is warm and dry. No rash  noted. No cyanosis. No jaundice. Nails show no clubbing.    Diagnostic Studies & Laboratory data:     Recent Radiology Findings:   DG Chest 2 View  Result Date: 06/16/2019 CLINICAL DATA:  Chest pain, neck pain, nausea, history MI EXAM: CHEST - 2 VIEW COMPARISON:  01/18/2018 FINDINGS: Normal heart size, mediastinal contours, and pulmonary vascularity. Atherosclerotic calcification aorta. Lungs clear. No pulmonary infiltrate, pleural effusion or pneumothorax. Bones unremarkable. IMPRESSION: No acute abnormalities. Aortic Atherosclerosis (ICD10-I70.0). Electronically Signed   By: Ulyses Southward M.D.   On: 06/16/2019 16:40   CT Angio Chest PE W and/or Wo Contrast  Result Date: 06/17/2019 CLINICAL DATA:  Neck pain, chest pain, nausea EXAM: CT ANGIOGRAPHY CHEST WITH CONTRAST TECHNIQUE: Multidetector CT imaging of the chest was performed using the standard protocol during bolus administration of intravenous contrast. Multiplanar CT image reconstructions and MIPs were obtained to evaluate the vascular anatomy. CONTRAST:  79mL OMNIPAQUE IOHEXOL 350 MG/ML SOLN COMPARISON:  None. FINDINGS: Cardiovascular: Cardiomegaly. No filling defects in the pulmonary arteries to suggest pulmonary emboli. Heart is normal size. Diffuse coronary artery calcifications. Aortic atherosclerosis. Mediastinum/Nodes: No mediastinal, hilar, or axillary adenopathy. Trachea and esophagus are unremarkable. Thyroid unremarkable. Lungs/Pleura: Lungs are clear. No focal airspace opacities or suspicious nodules. No effusions. Upper Abdomen: Imaging into the upper abdomen shows no acute findings. Changes of prior gastric sleeve. Musculoskeletal: Chest wall soft tissues are unremarkable. No acute bony abnormality. Review of the MIP images confirms the above findings. IMPRESSION: No evidence of pulmonary embolus. Diffuse coronary artery disease, advanced for patient's age. Cardiomegaly. No acute cardiopulmonary disease. Aortic Atherosclerosis  (ICD10-I70.0). Electronically Signed   By: Charlett Nose M.D.   On: 06/17/2019 00:35   ECHOCARDIOGRAM COMPLETE  Result Date: 06/17/2019    ECHOCARDIOGRAM REPORT   Patient Name:   Beverly [name] Date of Exam: 06/17/2019 Medical Rec #:  [id number]      Height:       60.0 in Accession #:    [id number]     Weight:       245.0 lb Date of Birth:  08-22-1975      BSA:          2.035 m Patient Age:    43 years       BP:           130/73 mmHg Patient Gender: F              HR:           74 bpm. Exam Location:  Inpatient Procedure: 2D Echo Indications:    chest pain 786.50  History:        Patient has no prior history of Echocardiogram examinations.                 CAD, Signs/Symptoms:Chest Pain; Risk Factors:Diabetes and                 Hypertension.  Sonographer:    Delcie Roch Referring Phys: [phone] KAREN M BLACK  Sonographer Comments: Image acquisition challenging due to patient body habitus. IMPRESSIONS  1. Left ventricular ejection fraction, by estimation, is 55 to  60%. The left ventricle has normal function. The left ventricle has no regional wall motion abnormalities. Left ventricular diastolic function could not be evaluated.  2. Right ventricular systolic function is normal. The right ventricular size is normal. Tricuspid regurgitation signal is inadequate for assessing PA pressure.  3. The mitral valve is degenerative. No evidence of mitral valve regurgitation. No evidence of mitral stenosis.  4. The aortic valve is tricuspid. Aortic valve regurgitation is not visualized. Mild aortic valve stenosis.  5. There is mild (Grade II) layered plaque involving the aortic root. FINDINGS  Left Ventricle: Left ventricular ejection fraction, by estimation, is 55 to 60%. The left ventricle has normal function. The left ventricle has no regional wall motion abnormalities. The left ventricular internal cavity size was normal in size. There is  no left ventricular hypertrophy. Left ventricular diastolic function could not be  evaluated. Right Ventricle: The right ventricular size is normal. No increase in right ventricular wall thickness. Right ventricular systolic function is normal. Tricuspid regurgitation signal is inadequate for assessing PA pressure. Left Atrium: Left atrial size was normal in size. Right Atrium: Right atrial size was normal in size. Pericardium: There is no evidence of pericardial effusion. Presence of pericardial fat pad. Mitral Valve: The mitral valve is degenerative in appearance. Mild mitral annular calcification. No evidence of mitral valve regurgitation. No evidence of mitral valve stenosis. Tricuspid Valve: The tricuspid valve is grossly normal. Tricuspid valve regurgitation is trivial. No evidence of tricuspid stenosis. Aortic Valve: The aortic valve is tricuspid. Aortic valve regurgitation is not visualized. Mild aortic stenosis is present. Aortic valve mean gradient measures 9.9 mmHg. Aortic valve peak gradient measures 19.8 mmHg. Aortic valve area, by VTI measures 1.43 cm. Pulmonic Valve: The pulmonic valve was grossly normal. Pulmonic valve regurgitation is not visualized. No evidence of pulmonic stenosis. Aorta: The aortic root is normal in size and structure. There is mild (Grade II) layered plaque involving the aortic root. Venous: The inferior vena cava was not well visualized. IAS/Shunts: The atrial septum is grossly normal.  LEFT VENTRICLE PLAX 2D LVIDd:         4.50 cm  Diastology LVIDs:         2.90 cm  LV e' lateral: 9.14 cm/s LV PW:         1.20 cm  LV e' medial:  8.81 cm/s LV IVS:        1.00 cm LVOT diam:     2.10 cm LV SV:         59 LV SV Index:   29 LVOT Area:     3.46 cm  RIGHT VENTRICLE RV S prime:     15.30 cm/s TAPSE (M-mode): 1.6 cm LEFT ATRIUM             Index       RIGHT ATRIUM          Index LA diam:        4.90 cm 2.41 cm/m  RA Area:     8.81 cm LA Vol (A2C):   38.0 ml 18.68 ml/m RA Volume:   14.80 ml 7.27 ml/m LA Vol (A4C):   37.6 ml 18.48 ml/m LA Biplane Vol: 37.8 ml  18.58 ml/m  AORTIC VALVE AV Area (Vmax):    1.14 cm AV Area (Vmean):   1.20 cm AV Area (VTI):     1.43 cm AV Vmax:           222.61 cm/s AV Vmean:  149.520 cm/s AV VTI:            0.411 m AV Peak Grad:      19.8 mmHg AV Mean Grad:      9.9 mmHg LVOT Vmax:         73.10 cm/s LVOT Vmean:        51.800 cm/s LVOT VTI:          0.169 m LVOT/AV VTI ratio: 0.41 TRICUSPID VALVE TR Peak grad:   21.3 mmHg TR Vmax:        231.00 cm/s  SHUNTS Systemic VTI:  0.17 m Systemic Diam: 2.10 cm Lennie Odor MD Electronically signed by Lennie Odor MD Signature Date/Time: 06/17/2019/3:40:51 PM    Final      I have independently reviewed the above radiologic studies and discussed with the patient   Recent Lab Findings: Lab Results  Component Value Date   WBC 8.6 06/18/2019   HGB 13.5 06/18/2019   HCT 39.0 06/18/2019   PLT 278 06/18/2019   GLUCOSE 198 (H) 06/18/2019   CHOL 214 (H) 06/17/2019   TRIG 113 06/17/2019   HDL 35 (L) 06/17/2019   LDLCALC 156 (H) 06/17/2019   NA 138 06/18/2019   K 3.9 06/18/2019   CL 105 06/18/2019   CREATININE 0.50 06/18/2019   BUN 13 06/18/2019   CO2 25 06/18/2019   HGBA1C 10.2 (H) 06/17/2019   Conclusion: cardiac cath    There is mild left ventricular systolic dysfunction. The left ventricular ejection fraction is 35-45% by visual estimate.  LV end diastolic pressure is mildly elevated.  There is mild (2+) mitral regurgitation.  --------------------------------  Prox LAD to Mid LAD lesion is 90% stenosed. Mid LAD lesion is 75% stenosed. Dist LAD lesion is 85% stenosed.  Ramus lesion is 65% stenosed.  Dist RCA lesion is 100% stenosed like via proximal edge of previously placed bare-metal stent   SUMMARY  Severe three-vessel disease: 100% occlusion of small distal RCA proximal to previously placed stent, diffuse 70-90% mid LAD stenosis of wraparound LAD, diffuse 65% proximal large ramus intermedius  Mildly reduced EF with basal inferior hypo-/akinesis  and anterolateral hypokinesis.  Borderline elevated LVEDP  RECOMMENDATIONS  Sheath will be removed in PACU holding area with manual pressure held for hemostasis.  Transfer to cardiac telemetry unit for post sheath removal care.  CVTS consultation recommended based on three-vessel CAD -> existing disease is not favorable for PCI with long LAD lesion   Bryan Lemma, MD        Assessment / Plan: Severe three-vessel coronary artery disease. Type 2 diabetes-insulin-dependent Hypertension Hyperlipidemia Morbid obesity status post previous gastric sleeve. Previous stent placement in the right coronary artery. Previous myocardial infarction 2009    We will get preop vascular studies.  Plan: CABG    I  spent 60 minutes counseling the patient face to face.   Rowe Clack, PA-C 06/18/2019 3:51 PM  Patient seen and examined, records and cath images reviewed. Agree with above.  She is a 44 yo woman with a past history of insulin dependent diabetes, known CAD with prior stent, mild AS, obesity, hypertension, hyperlipidemia and peripheral arterial disease.  She presented with unstable chest pain, but ruled out for MI. Cath showed severe 3 VD with a totally occluded RCA, 90% long segment LAD and 65% stenosis of a large ramus intermedius supplying the lateral wall. Not favorable for PCI, therefore referred for CABG.  CABG is indicated for survival benefit and relief of symptoms. Conduit  may be an issue as she has small radial arteries and varicosities of right leg vein. Will plan to assess left radial intraoperatively to see if it is usable and also plan to use left saphenous in addition to LIMA. She did have a normal Allen's on the left.  I discussed the general nature of the procedure, including the need for general anesthesia, the incisions to be used, the use of cardiopulmonary bypass, and the use of drainage tubes postoperatively with Beverly [name]. We discussed the expected  hospital stay, overall recovery and short and long term outcomes. She understands this is a palliative, not a curative operation. I informed her of the indications, risks, benefits and alternatives. She understands the risks include, but are not limited to death, stroke, MI, DVT/PE, bleeding, possible need for transfusion, infections, cardiac arrhythmias as well as other organ system dysfunction including respiratory, renal, or GI complications.   She will discuss with her family before making a decision  If she decides to proceed will do surgery in AM tomorrow.   Salvatore DecentSteven C. Dorris FetchHendrickson, MD [facility] [phone]

## 2019-06-18 NOTE — Anesthesia Preprocedure Evaluation (Addendum)
Anesthesia Evaluation  Patient identified by MRN, date of birth, ID band Patient awake    Reviewed: Allergy & Precautions, NPO status , Patient's Chart, lab work & pertinent test results, reviewed documented beta blocker date and time   Airway Mallampati: III  TM Distance: >3 FB Neck ROM: Full    Dental  (+) Teeth Intact, Dental Advisory Given   Pulmonary neg pulmonary ROS,    Pulmonary exam normal breath sounds clear to auscultation       Cardiovascular hypertension, Pt. on home beta blockers and Pt. on medications + angina + CAD, + Cardiac Stents and + DVT  Normal cardiovascular exam Rhythm:Regular Rate:Normal  Cath 3/24: Severe three-vessel disease: 100% occlusion of small distal RCA proximal to previously placed stent, diffuse 70-90% mid LAD stenosis of wraparound LAD, diffuse 65% proximal large ramus intermedius Mildly reduced EF with basal inferior hypo-/akinesis and anterolateral hypokinesis. Borderline elevated LVEDP  Echo 06/17/19: 1. Left ventricular ejection fraction, by estimation, is 55 to 60%. The left ventricle has normal function. The left ventricle has no regional wall motion abnormalities. Left ventricular diastolic function could not be evaluated.  2. Right ventricular systolic function is normal. The right ventricular size is normal. Tricuspid regurgitation signal is inadequate for assessing PA pressure.  3. The mitral valve is degenerative. No evidence of mitral valve regurgitation. No evidence of mitral stenosis.  4. The aortic valve is tricuspid. Aortic valve regurgitation is not visualized. Mild aortic valve stenosis.  5. There is mild (Grade II) layered plaque involving the aortic root.    Neuro/Psych negative neurological ROS  negative psych ROS   GI/Hepatic negative GI ROS, Neg liver ROS,   Endo/Other  diabetes, Type 2, Insulin DependentMorbid obesity  Renal/GU negative Renal ROS      Musculoskeletal negative musculoskeletal ROS (+)   Abdominal   Peds  Hematology negative hematology ROS (+)   Anesthesia Other Findings Day of surgery medications reviewed with the patient.  Reproductive/Obstetrics                           Anesthesia Physical Anesthesia Plan  ASA: IV  Anesthesia Plan: General   Post-op Pain Management:    Induction: Intravenous  PONV Risk Score and Plan: 3 and Treatment may vary due to age or medical condition  Airway Management Planned: Oral ETT  Additional Equipment: Arterial line, CVP, PA Cath, TEE and Ultrasound Guidance Line Placement  Intra-op Plan:   Post-operative Plan: Post-operative intubation/ventilation  Informed Consent: I have reviewed the patients History and Physical, chart, labs and discussed the procedure including the risks, benefits and alternatives for the proposed anesthesia with the patient or authorized representative who has indicated his/her understanding and acceptance.     Dental advisory given  Plan Discussed with: CRNA  Anesthesia Plan Comments:        Anesthesia Quick Evaluation

## 2019-06-18 NOTE — Progress Notes (Addendum)
Progress Note  Patient Name: Beverly [name] Date of Encounter: 06/18/2019  Primary Cardiologist: No primary care provider on file.   Subjective   No chest pain this morning.   Inpatient Medications    Scheduled Meds: . aspirin  81 mg Oral Daily  . atorvastatin  40 mg Oral q1800  . carvedilol  12.5 mg Oral q AM  . enoxaparin (LOVENOX) injection  40 mg Subcutaneous Q24H  . gabapentin  300 mg Oral QHS  . insulin aspart  300 Units Subcutaneous Once  . insulin pump   Subcutaneous TID WC, HS, 0200  . losartan  100 mg Oral Daily  . sodium chloride flush  3 mL Intravenous Once   Continuous Infusions: . sodium chloride 1 mL/kg/hr (06/18/19 0525)  . famotidine (PEPCID) IV 20 mg (06/18/19 0833)   PRN Meds: acetaminophen, alum & mag hydroxide-simeth, nitroGLYCERIN, ondansetron (ZOFRAN) IV   Vital Signs    Vitals:   06/17/19 1736 06/18/19 0033 06/18/19 0053 06/18/19 0620  BP: (!) 142/64 (!) 126/58 (!) 109/50 122/71  Pulse: 72 91 75 72  Resp: 16 14 17 16   Temp: 98.1 F (36.7 C) 98.6 F (37 C) 98.1 F (36.7 C) 97.8 F (36.6 C)  TempSrc: Oral Oral Oral Oral  SpO2: 100% 97% 92% 99%  Weight:      Height:        Intake/Output Summary (Last 24 hours) at 06/18/2019 1010 Last data filed at 06/18/2019 0500 Gross per 24 hour  Intake 473.33 ml  Output --  Net 473.33 ml   Last 3 Weights 06/16/2019  Weight (lbs) 245 lb  Weight (kg) 111.131 kg      Telemetry    SR - Personally Reviewed  ECG    SR with prior inferior infarct - Personally Reviewed  Physical Exam  Pleasant younger WF, sitting up in chair.  GEN: No acute distress.   Neck: No JVD Cardiac: RRR, no murmurs, rubs, or gallops.  Respiratory: Clear to auscultation bilaterally. GI: Soft, nontender, non-distended  MS: No edema; No deformity. Neuro:  Nonfocal  Psych: Normal affect   Labs    High Sensitivity Troponin:   Recent Labs  Lab 06/16/19 1620 06/16/19 1949  TROPONINIHS 14 16       Chemistry Recent Labs  Lab 06/16/19 1620 06/17/19 0500 06/18/19 0141  NA 137 141 138  K 3.9 3.4* 3.9  CL 100 107 105  CO2 28 25 25   GLUCOSE 227* 173* 198*  BUN 11 10 13   CREATININE 0.51 0.49 0.50  CALCIUM 9.3 8.0* 8.8*  GFRNONAA >60 >60 >60  GFRAA >60 >60 >60  ANIONGAP 9 9 8      Hematology Recent Labs  Lab 06/17/19 0500 06/17/19 1754 06/18/19 0141  WBC 5.3 8.3 8.6  RBC 3.34*  3.31* 4.77 4.46  HGB 10.0* 14.3 13.5  HCT 30.2* 42.0 39.0  MCV 90.4 88.1 87.4  MCH 29.9 30.0 30.3  MCHC 33.1 34.0 34.6  RDW 12.6 12.6 12.6  PLT 209 330 278    BNPNo results for input(s): BNP, PROBNP in the last 168 hours.   DDimer  Recent Labs  Lab 06/16/19 2200  DDIMER 0.81*     Radiology    DG Chest 2 View  Result Date: 06/16/2019 CLINICAL DATA:  Chest pain, neck pain, nausea, history MI EXAM: CHEST - 2 VIEW COMPARISON:  01/18/2018 FINDINGS: Normal heart size, mediastinal contours, and pulmonary vascularity. Atherosclerotic calcification aorta. Lungs clear. No pulmonary infiltrate, pleural effusion or pneumothorax. Bones  unremarkable. IMPRESSION: No acute abnormalities. Aortic Atherosclerosis (ICD10-I70.0). Electronically Signed   By: Ulyses Southward M.D.   On: 06/16/2019 16:40   CT Angio Chest PE W and/or Wo Contrast  Result Date: 06/17/2019 CLINICAL DATA:  Neck pain, chest pain, nausea EXAM: CT ANGIOGRAPHY CHEST WITH CONTRAST TECHNIQUE: Multidetector CT imaging of the chest was performed using the standard protocol during bolus administration of intravenous contrast. Multiplanar CT image reconstructions and MIPs were obtained to evaluate the vascular anatomy. CONTRAST:  76mL OMNIPAQUE IOHEXOL 350 MG/ML SOLN COMPARISON:  None. FINDINGS: Cardiovascular: Cardiomegaly. No filling defects in the pulmonary arteries to suggest pulmonary emboli. Heart is normal size. Diffuse coronary artery calcifications. Aortic atherosclerosis. Mediastinum/Nodes: No mediastinal, hilar, or axillary adenopathy.  Trachea and esophagus are unremarkable. Thyroid unremarkable. Lungs/Pleura: Lungs are clear. No focal airspace opacities or suspicious nodules. No effusions. Upper Abdomen: Imaging into the upper abdomen shows no acute findings. Changes of prior gastric sleeve. Musculoskeletal: Chest wall soft tissues are unremarkable. No acute bony abnormality. Review of the MIP images confirms the above findings. IMPRESSION: No evidence of pulmonary embolus. Diffuse coronary artery disease, advanced for patient's age. Cardiomegaly. No acute cardiopulmonary disease. Aortic Atherosclerosis (ICD10-I70.0). Electronically Signed   By: Charlett Nose M.D.   On: 06/17/2019 00:35   ECHOCARDIOGRAM COMPLETE  Result Date: 06/17/2019    ECHOCARDIOGRAM REPORT   Patient Name:   Beverly [name] Date of Exam: 06/17/2019 Medical Rec #:  [id number]      Height:       60.0 in Accession #:    [id number]     Weight:       245.0 lb Date of Birth:  1975/11/06      BSA:          2.035 m Patient Age:    43 years       BP:           130/73 mmHg Patient Gender: F              HR:           74 bpm. Exam Location:  Inpatient Procedure: 2D Echo Indications:    chest pain 786.50  History:        Patient has no prior history of Echocardiogram examinations.                 CAD, Signs/Symptoms:Chest Pain; Risk Factors:Diabetes and                 Hypertension.  Sonographer:    Delcie Roch Referring Phys: [phone] KAREN M BLACK  Sonographer Comments: Image acquisition challenging due to patient body habitus. IMPRESSIONS  1. Left ventricular ejection fraction, by estimation, is 55 to 60%. The left ventricle has normal function. The left ventricle has no regional wall motion abnormalities. Left ventricular diastolic function could not be evaluated.  2. Right ventricular systolic function is normal. The right ventricular size is normal. Tricuspid regurgitation signal is inadequate for assessing PA pressure.  3. The mitral valve is degenerative. No evidence of mitral  valve regurgitation. No evidence of mitral stenosis.  4. The aortic valve is tricuspid. Aortic valve regurgitation is not visualized. Mild aortic valve stenosis.  5. There is mild (Grade II) layered plaque involving the aortic root. FINDINGS  Left Ventricle: Left ventricular ejection fraction, by estimation, is 55 to 60%. The left ventricle has normal function. The left ventricle has no regional wall motion abnormalities. The left ventricular internal cavity size was normal in size. There  is  no left ventricular hypertrophy. Left ventricular diastolic function could not be evaluated. Right Ventricle: The right ventricular size is normal. No increase in right ventricular wall thickness. Right ventricular systolic function is normal. Tricuspid regurgitation signal is inadequate for assessing PA pressure. Left Atrium: Left atrial size was normal in size. Right Atrium: Right atrial size was normal in size. Pericardium: There is no evidence of pericardial effusion. Presence of pericardial fat pad. Mitral Valve: The mitral valve is degenerative in appearance. Mild mitral annular calcification. No evidence of mitral valve regurgitation. No evidence of mitral valve stenosis. Tricuspid Valve: The tricuspid valve is grossly normal. Tricuspid valve regurgitation is trivial. No evidence of tricuspid stenosis. Aortic Valve: The aortic valve is tricuspid. Aortic valve regurgitation is not visualized. Mild aortic stenosis is present. Aortic valve mean gradient measures 9.9 mmHg. Aortic valve peak gradient measures 19.8 mmHg. Aortic valve area, by VTI measures 1.43 cm. Pulmonic Valve: The pulmonic valve was grossly normal. Pulmonic valve regurgitation is not visualized. No evidence of pulmonic stenosis. Aorta: The aortic root is normal in size and structure. There is mild (Grade II) layered plaque involving the aortic root. Venous: The inferior vena cava was not well visualized. IAS/Shunts: The atrial septum is grossly normal.   LEFT VENTRICLE PLAX 2D LVIDd:         4.50 cm  Diastology LVIDs:         2.90 cm  LV e' lateral: 9.14 cm/s LV PW:         1.20 cm  LV e' medial:  8.81 cm/s LV IVS:        1.00 cm LVOT diam:     2.10 cm LV SV:         59 LV SV Index:   29 LVOT Area:     3.46 cm  RIGHT VENTRICLE RV S prime:     15.30 cm/s TAPSE (M-mode): 1.6 cm LEFT ATRIUM             Index       RIGHT ATRIUM          Index LA diam:        4.90 cm 2.41 cm/m  RA Area:     8.81 cm LA Vol (A2C):   38.0 ml 18.68 ml/m RA Volume:   14.80 ml 7.27 ml/m LA Vol (A4C):   37.6 ml 18.48 ml/m LA Biplane Vol: 37.8 ml 18.58 ml/m  AORTIC VALVE AV Area (Vmax):    1.14 cm AV Area (Vmean):   1.20 cm AV Area (VTI):     1.43 cm AV Vmax:           222.61 cm/s AV Vmean:          149.520 cm/s AV VTI:            0.411 m AV Peak Grad:      19.8 mmHg AV Mean Grad:      9.9 mmHg LVOT Vmax:         73.10 cm/s LVOT Vmean:        51.800 cm/s LVOT VTI:          0.169 m LVOT/AV VTI ratio: 0.41 TRICUSPID VALVE TR Peak grad:   21.3 mmHg TR Vmax:        231.00 cm/s  SHUNTS Systemic VTI:  0.17 m Systemic Diam: 2.10 cm Lennie Odor MD Electronically signed by Lennie Odor MD Signature Date/Time: 06/17/2019/3:40:51 PM    Final     Cardiac Studies  Echo: 06/17/19  IMPRESSIONS    1. Left ventricular ejection fraction, by estimation, is 55 to 60%. The  left ventricle has normal function. The left ventricle has no regional  wall motion abnormalities. Left ventricular diastolic function could not  be evaluated.  2. Right ventricular systolic function is normal. The right ventricular  size is normal. Tricuspid regurgitation signal is inadequate for assessing  PA pressure.  3. The mitral valve is degenerative. No evidence of mitral valve  regurgitation. No evidence of mitral stenosis.  4. The aortic valve is tricuspid. Aortic valve regurgitation is not  visualized. Mild aortic valve stenosis.  5. There is mild (Grade II) layered plaque involving the aortic  root.   Patient Profile     44 y.o. female  with a hx of DM II on insulin pump, CAD s/p stent 2009, HTN, HLD, h/o DVT, morbid obesity s/p bariatric surgery and PCOS who is being seen today for the evaluation of chest pain at the request of Dr. Irene Limbo.  Assessment & Plan    1. Chest pain with nausea and vomiting: Had both typical and atypical features. Symptoms were concerning for her anginal equivalent. hsTn 14>>16. Planned for cardiac cath today. No chest pain overnight.   2. CAD: She has history of early CAD at age 50 when she had the first stent. Cath was done at [facility].   3. HTN: blood pressures are stable.  4. HL: LDL 156, reports missing several doses of statin prior to admission. Will further increase atorvastatin to 80mg . Reports having issues with higher dose statins in the past. May need to consider PCSK9s.   5. DM: on insulin pump. Hgb A1c 10. Does not appear she has been on oral therapy prior to admission. Reports trying metformin in the past with GI upset.  -- would consider Jardiance prior to discharge.   6. Morbid obesity s/p bariatric surgery  For questions or updates, please contact [facility] Please consult [email or web address] for contact info under   Signed, , NP  06/18/2019, 10:10 AM    I have personally seen and examined this patient. I agree with the assessment and plan as outlined above.  Chest pain free today. Troponin negative but given recent symptoms with known CAD, no recent ischemic testing and risk factors, will plan cardiac cath today.   06/20/2019 06/18/2019 11:22 AM

## 2019-06-18 NOTE — Progress Notes (Addendum)
Inpatient Diabetes Program Recommendations  AACE/ADA: New Consensus Statement on Inpatient Glycemic Control (2015)  Target Ranges:  Prepandial:   less than 140 mg/dL      Peak postprandial:   less than 180 mg/dL (1-2 hours)      Critically ill patients:  140 - 180 mg/dL   Lab Results  Component Value Date   GLUCAP 157 (H) 06/18/2019   HGBA1C 10.2 (H) 06/17/2019    Review of Glycemic Control  Saw pt at bedside this am to get the rest of her pump settings from her insulin pump.  Basal 40.9 units in a 24 hour period Carb coverage  12A 1:6             1 unit for every 6 grams of carbs 6P   1:5.5   1 unit for every 5.5 grams of carbs  Sensitivity 25      1 units drops glucose by 25 mg/dl  Target 962  Active insulin time 5 hours  Pt got her insurance from the marketplace, [employer].   Spoke with pt regarding her A1c level. Discussed importance of glucose control. Pt reports her PCP does not take his time to manage her Diabetes. Pt sees her PCP Monday, pt to ask for Endocrinology referral at that time. Pt does not mind coming to [facility] in order to see an Endocrinologist. Gave list of Endo's on her AVS.  Pt needs vial of Novolog to refill her insulin pump. Pt has her insulin pump supplies at bedside except for insulin.  Called pharmacy. Secretary sent A11 form for Novolog vial for pt at the direction of pharmacist.  Thanks,  Christena Deem RN, MSN, BC-ADM Inpatient Diabetes Coordinator Team Pager [phone] (8a-5p)

## 2019-06-18 NOTE — TOC Benefit Eligibility Note (Signed)
Transition of Care [facility]) Benefit Eligibility Note    Patient Details  Name: LACRECIA DELVAL MRN: [id number] Date of Birth: 19-Apr-1975   Medication/Dose: [place] Pepper 10 mg  Covered?: Yes  Tier: 3 Drug  Prescription Coverage Preferred Pharmacy: [facility]  Spoke with Person/Company/Phone Number:: [employer]el[employer]/[phone]  Co-Pay: 150.00 for a 30 day Supply Retail/ 375.00 90 day Supply Mail Order  Prior Approval: No  Deductible: Unmet  Additional Notes: High copay due to unmet Deductible    Dorena Bodo Phone Number: 06/18/2019, 1:13 PM

## 2019-06-18 NOTE — Progress Notes (Signed)
Vascular at bedside for dopplers

## 2019-06-18 NOTE — Interval H&P Note (Signed)
History and Physical Interval Note:  06/18/2019 1:23 PM  Beverly [name]  has presented today for surgery, with the diagnosis of chest pain -concern for unstable angina.  The various methods of treatment have been discussed with the patient and family. After consideration of risks, benefits and other options for treatment, the patient has consented to  Procedure(s): LEFT HEART CATH AND CORONARY ANGIOGRAPHY (N/A)  PERCUTANEOUS CORONARY INTERVENTION   as a surgical intervention.  The patient's history has been reviewed, patient examined, no change in status, stable for surgery.  I have reviewed the patient's chart and labs.  Questions were answered to the patient's satisfaction.   Cath Lab Visit (complete for each Cath Lab visit)  Clinical Evaluation Leading to the Procedure:   ACS: Yes.    Non-ACS:    Anginal Classification: CCS III  Anti-ischemic medical therapy: Minimal Therapy (1 class of medications)  Non-Invasive Test Results: No non-invasive testing performed  Prior CABG: No previous CABG   Bryan Lemma

## 2019-06-18 NOTE — Progress Notes (Signed)
TCTS consulted for CABG evaluation. °

## 2019-06-18 NOTE — Care Management (Signed)
Entered benefit check for jardiance 10 mg PO daily.   Ronny Flurry RN

## 2019-06-18 NOTE — Progress Notes (Signed)
Dr. [name] at the bedside speaking with the patient.

## 2019-06-18 NOTE — Progress Notes (Signed)
Informed Dr. Cornelius Moras pt has decided to go for CABG in AM. Dr. Cornelius Moras stated he would forward information to Dr. [name] so appropriate orders can be placed.

## 2019-06-18 NOTE — Progress Notes (Signed)
Pre-CABG Dopplers completed. Refer to "CV Proc" under chart review to view preliminary results.  06/18/2019 5:53 PM Eula Fried., MHA, RVT, RDCS, RDMS

## 2019-06-18 NOTE — Progress Notes (Signed)
PA with CVTS at the bedside.

## 2019-06-18 NOTE — H&P (View-Only) (Signed)
    [address]       [name] [address]             [phone]        [name] Medical Record #[id number] Date of Birth: 09/23/1975  Referring: No ref. provider found Primary Care: [name], [name], MD Primary Cardiologist:No primary care provider on file.  Chief Complaint: chest pain with nausea and vomiting  History of Present Illness:    The patient is a 44-year-old female with a history of coronary artery disease status post previous stent placement in 2009 who presented to the emergency department on 06/16/2019 with complaints of chest pain.  She reports that she has had a couple days of waxing and waning chest pain that initially she thought were's indigestion but ultimately began to experience aching in her left jaw which she felt was similar to her previous chest pain when she had a myocardial infarction and required stent placement.  She also had  associated shortness of breath.  Initial EKG showed no ischemic changes but did have inferior Qwaves.  And high-sensitivity troponin was normal x2.  Initial D-dimer was 0.81 in the emergency department.  Her symptoms did improve initially with a GI cocktail.  A chest CT angio showed no evidence of pulmonary embolus.  It did show advanced coronary artery disease.  Also revealed cardiomegaly but no acute cardiopulmonary disease.  There was aortic atherosclerosis.  The patient has other significant cardiac risk factors including insulin-dependent diabetes, hyperlipidemia, hypertension, and morbid obesity.  She does have a history of bariatric surgery in 2015.  She initially lost about 70 pounds but has gained back.  COVID-19 screen was negative.  Cardiology consultation was obtained.  She has been lost to cardiology follow-up since 2015 but did see [name] in the past.  Echocardiogram was done and this showed ejection fraction of 55%.  Please see the full report below.  Cardiac catheterization was performed on  today's date and the full report is currently pending.  We have been asked to see the patient in cardiothoracic surgical consultation for consideration of CABG.    Current Activity/ Functional Status: Patient is independent with mobility/ambulation, transfers, ADL's, IADL's.   Zubrod Score: At the time of surgery this patient's most appropriate activity status/level should be described as: []    0    Normal activity, no symptoms [x]    1    Restricted in physical strenuous activity but ambulatory, able to do out light work []    2    Ambulatory and capable of self care, unable to do work activities, up and about                 more than 50%  Of the time                            []    3    Only limited self care, in bed greater than 50% of waking hours []    4    Completely disabled, no self care, confined to bed or chair []    5    Moribund  Past Medical History:  Diagnosis Date  . CAD (coronary artery disease) 06/16/2019  . Diabetes mellitus without complication (HCC)   . Hypertension   . Multiple thyroid nodules   . Obesity     Past Surgical History:  Procedure Laterality Date  .   Cardiac Stent  2009    Social History   Tobacco Use  Smoking Status Never Smoker  Smokeless Tobacco Never Used    Social History   Substance and Sexual Activity  Alcohol Use Never     Allergies  Allergen Reactions  . Shellfish-Derived Products Diarrhea  . Tape Other (See Comments)    Can blister the skin    Current Facility-Administered Medications  Medication Dose Route Frequency Provider Last Rate Last Admin  . 0.9% sodium chloride infusion  1 mL/kg/hr Intravenous Continuous Harding, David W, MD 111.1 mL/hr at 06/18/19 0525 1 mL/kg/hr at 06/18/19 0525  . [MAR Hold] acetaminophen (TYLENOL) tablet 650 mg  650 mg Oral Q4H PRN [name], [name], MD      . [MAR Hold] alum & mag hydroxide-simeth (MAALOX/MYLANTA) 200-200-20 MG/5ML suspension 30 mL  30 mL Oral Q6H PRN [name], [name], MD    30 mL at 06/17/19 1956  . [MAR Hold] aspirin chewable tablet 81 mg  81 mg Oral Daily [name], [name], MD   81 mg at 06/18/19 0831  . [MAR Hold] atorvastatin (LIPITOR) tablet 80 mg  80 mg Oral q1800 Roberts, [name], NP      . [MAR Hold] carvedilol (COREG) tablet 12.5 mg  12.5 mg Oral q AM [name], [name], MD   12.5 mg at 06/18/19 0838  . [MAR Hold] enoxaparin (LOVENOX) injection 40 mg  40 mg Subcutaneous Q24H [name], [name], MD   40 mg at 06/17/19 2211  . [MAR Hold] famotidine (PEPCID) tablet 20 mg  20 mg Oral BID Roberts, [name], NP      . [MAR Hold] gabapentin (NEURONTIN) capsule 300 mg  300 mg Oral QHS [name], [name], MD   300 mg at 06/17/19 2209  . [MAR Hold] insulin pump   Subcutaneous TID WC, HS, 0200 [name], [name], MD   Given at 06/18/19 1222  . [MAR Hold] losartan (COZAAR) tablet 100 mg  100 mg Oral Daily [name], [name], MD   100 mg at 06/18/19 0830  . [MAR Hold] nitroGLYCERIN (NITROSTAT) SL tablet 0.4 mg  0.4 mg Sublingual Q5 Min x 3 PRN [name], [name], MD      . [MAR Hold] ondansetron (ZOFRAN) injection 4 mg  4 mg Intravenous Q6H PRN [name], [name], MD      . [MAR Hold] sodium chloride flush (NS) 0.9 % injection 3 mL  3 mL Intravenous Once [name], [name], MD        Medications Prior to Admission  Medication Sig Dispense Refill Last Dose  . aluminum-magnesium hydroxide-simethicone (MAALOX) 200-200-20 MG/5ML SUSP Take 30 mLs by mouth 3 (three) times daily as needed (for gas/indigestion).    unk at unk  . aspirin 81 MG EC tablet Take 81 mg by mouth daily.    06/16/2019 at 0630  . atorvastatin (LIPITOR) 40 MG tablet Take 40 mg by mouth at bedtime.    06/15/2019 at pm  . carvedilol (COREG) 12.5 MG tablet Take 12.5 mg by mouth in the morning.    06/16/2019 at 0630  . gabapentin (NEURONTIN) 300 MG capsule Take 300 mg by mouth at bedtime.   06/15/2019 at pm  . ibuprofen (ADVIL) 200 MG tablet Take 600 mg by mouth every 6 (six) hours as needed (for pain).   06/14/2019 at Unknown time  .  Insulin Human (INSULIN PUMP) SOLN Inject 1 each into the skin continuous. [employer]- Uses Humalog   Continuous at Continuous  .   insulin lispro (HUMALOG) 100 UNIT/ML injection Inject into the skin See admin instructions. For use in [employer] insulin pump   Continuous at Continuous  . losartan (COZAAR) 100 MG tablet Take 100 mg by mouth daily.    06/16/2019 at am  . Multiple Vitamins-Calcium (ONE-A-DAY WOMENS FORMULA) TABS Take 1 tablet by mouth daily with breakfast.   06/16/2019 at am  . simethicone (GAS-X EXTRA STRENGTH) 125 MG chewable tablet Chew 125 mg by mouth every 6 (six) hours as needed for flatulence.   unk at unk    Family History  Problem Relation Age of Onset  . Hypertension Mother   . Diabetes Mellitus II Mother   . Hypertension Father   . Diabetes Mellitus II Father   . Heart disease Father        ?pacemaker vs loop recorder?  . Heart disease Maternal Grandmother        CABGx 4     Review of Systems:   Review of Systems  Constitutional: Positive for chills and malaise/fatigue. Negative for diaphoresis, fever and weight loss.  HENT: Negative for congestion, ear discharge, ear pain, hearing loss, nosebleeds, sinus pain, sore throat and tinnitus.   Eyes: Negative for blurred vision, double vision, photophobia, pain, discharge and redness.  Respiratory: Positive for cough and shortness of breath. Negative for hemoptysis, sputum production, wheezing and stridor.   Cardiovascular: Positive for chest pain, orthopnea and leg swelling. Negative for palpitations, claudication and PND.  Gastrointestinal: Positive for abdominal pain, diarrhea, heartburn, nausea and vomiting. Negative for blood in stool, constipation and melena.  Genitourinary: Positive for frequency and urgency. Negative for dysuria, flank pain and hematuria.  Musculoskeletal: Positive for back pain, joint pain and neck pain. Negative for falls and myalgias.  Skin: Negative.   Neurological: Positive for tingling, loss  of consciousness and weakness. Negative for dizziness, tremors, sensory change, speech change, focal weakness, seizures and headaches.       Hand weakness, drops things frequently  Endo/Heme/Allergies: Positive for environmental allergies and polydipsia. Bruises/bleeds easily.  Psychiatric/Behavioral: Positive for depression. Negative for hallucinations, memory loss, substance abuse and suicidal ideas. The patient is nervous/anxious. The patient does not have insomnia.     Physical Exam: BP (!) 155/87   Pulse 84   Temp 98.2 F (36.8 C) (Oral)   Resp 12   Ht 5' (1.524 m)   Wt 111.1 kg   SpO2 100%   BMI 47.85 kg/m   Physical Exam  Constitutional: No distress.  Morbidly obese adult female, in no acute distress  HENT:  Nose: No nasal discharge.  Mouth/Throat: Dentition is normal. No dental caries. Oropharynx is clear. Pharynx is normal.  No obvious dental caries  Eyes: Pupils are equal, round, and reactive to light. Conjunctivae are normal.  Neck: Thyroid normal. No JVD present. No neck adenopathy. No thyromegaly present.  Cardiovascular: Normal rate, regular rhythm, S1 normal, S2 normal and normal heart sounds. Exam reveals no gallop.  No murmur heard. Pulses:      Dorsalis pedis pulses are 0 on the right side and 0 on the left side.       Posterior tibial pulses are 0 on the right side and 0 on the left side.  No carotid bruits  Notable for spider veins but no definite varicosities  Pulmonary/Chest: Breath sounds normal. She has no wheezes. She has no rales. She exhibits no tenderness.  Abdominal: Soft. Bowel sounds are normal. She exhibits no distension and no mass. There is no abdominal tenderness.    obese  Musculoskeletal:        General: No tenderness or deformity.     Cervical back: Normal range of motion and neck supple.     Comments: Trace edema   Neurological: She is alert and oriented to person, place, and time.  Gait not tested  Skin: Skin is warm and dry. No rash  noted. No cyanosis. No jaundice. Nails show no clubbing.    Diagnostic Studies & Laboratory data:     Recent Radiology Findings:   DG Chest 2 View  Result Date: 06/16/2019 CLINICAL DATA:  Chest pain, neck pain, nausea, history MI EXAM: CHEST - 2 VIEW COMPARISON:  01/18/2018 FINDINGS: Normal heart size, mediastinal contours, and pulmonary vascularity. Atherosclerotic calcification aorta. Lungs clear. No pulmonary infiltrate, pleural effusion or pneumothorax. Bones unremarkable. IMPRESSION: No acute abnormalities. Aortic Atherosclerosis (ICD10-I70.0). Electronically Signed   By: Mark  Boles M.D.   On: 06/16/2019 16:40   CT Angio Chest PE W and/or Wo Contrast  Result Date: 06/17/2019 CLINICAL DATA:  Neck pain, chest pain, nausea EXAM: CT ANGIOGRAPHY CHEST WITH CONTRAST TECHNIQUE: Multidetector CT imaging of the chest was performed using the standard protocol during bolus administration of intravenous contrast. Multiplanar CT image reconstructions and MIPs were obtained to evaluate the vascular anatomy. CONTRAST:  75mL OMNIPAQUE IOHEXOL 350 MG/ML SOLN COMPARISON:  None. FINDINGS: Cardiovascular: Cardiomegaly. No filling defects in the pulmonary arteries to suggest pulmonary emboli. Heart is normal size. Diffuse coronary artery calcifications. Aortic atherosclerosis. Mediastinum/Nodes: No mediastinal, hilar, or axillary adenopathy. Trachea and esophagus are unremarkable. Thyroid unremarkable. Lungs/Pleura: Lungs are clear. No focal airspace opacities or suspicious nodules. No effusions. Upper Abdomen: Imaging into the upper abdomen shows no acute findings. Changes of prior gastric sleeve. Musculoskeletal: Chest wall soft tissues are unremarkable. No acute bony abnormality. Review of the MIP images confirms the above findings. IMPRESSION: No evidence of pulmonary embolus. Diffuse coronary artery disease, advanced for patient's age. Cardiomegaly. No acute cardiopulmonary disease. Aortic Atherosclerosis  (ICD10-I70.0). Electronically Signed   By: Kevin  Dover M.D.   On: 06/17/2019 00:35   ECHOCARDIOGRAM COMPLETE  Result Date: 06/17/2019    ECHOCARDIOGRAM REPORT   Patient Name:   [name] Date of Exam: 06/17/2019 Medical Rec #:  [id number]      Height:       60.0 in Accession #:    [id number]     Weight:       245.0 lb Date of Birth:  10/25/1975      BSA:          2.035 m Patient Age:    43 years       BP:           130/73 mmHg Patient Gender: F              HR:           74 bpm. Exam Location:  Inpatient Procedure: 2D Echo Indications:    chest pain 786.50  History:        Patient has no prior history of Echocardiogram examinations.                 CAD, Signs/Symptoms:Chest Pain; Risk Factors:Diabetes and                 Hypertension.  Sonographer:    Lauren Pennington Referring Phys: [id number] KAREN M BLACK  Sonographer Comments: Image acquisition challenging due to patient body habitus. IMPRESSIONS  1. Left ventricular ejection fraction, by estimation, is 55 to   60%. The left ventricle has normal function. The left ventricle has no regional wall motion abnormalities. Left ventricular diastolic function could not be evaluated.  2. Right ventricular systolic function is normal. The right ventricular size is normal. Tricuspid regurgitation signal is inadequate for assessing PA pressure.  3. The mitral valve is degenerative. No evidence of mitral valve regurgitation. No evidence of mitral stenosis.  4. The aortic valve is tricuspid. Aortic valve regurgitation is not visualized. Mild aortic valve stenosis.  5. There is mild (Grade II) layered plaque involving the aortic root. FINDINGS  Left Ventricle: Left ventricular ejection fraction, by estimation, is 55 to 60%. The left ventricle has normal function. The left ventricle has no regional wall motion abnormalities. The left ventricular internal cavity size was normal in size. There is  no left ventricular hypertrophy. Left ventricular diastolic function could not be  evaluated. Right Ventricle: The right ventricular size is normal. No increase in right ventricular wall thickness. Right ventricular systolic function is normal. Tricuspid regurgitation signal is inadequate for assessing PA pressure. Left Atrium: Left atrial size was normal in size. Right Atrium: Right atrial size was normal in size. Pericardium: There is no evidence of pericardial effusion. Presence of pericardial fat pad. Mitral Valve: The mitral valve is degenerative in appearance. Mild mitral annular calcification. No evidence of mitral valve regurgitation. No evidence of mitral valve stenosis. Tricuspid Valve: The tricuspid valve is grossly normal. Tricuspid valve regurgitation is trivial. No evidence of tricuspid stenosis. Aortic Valve: The aortic valve is tricuspid. Aortic valve regurgitation is not visualized. Mild aortic stenosis is present. Aortic valve mean gradient measures 9.9 mmHg. Aortic valve peak gradient measures 19.8 mmHg. Aortic valve area, by VTI measures 1.43 cm. Pulmonic Valve: The pulmonic valve was grossly normal. Pulmonic valve regurgitation is not visualized. No evidence of pulmonic stenosis. Aorta: The aortic root is normal in size and structure. There is mild (Grade II) layered plaque involving the aortic root. Venous: The inferior vena cava was not well visualized. IAS/Shunts: The atrial septum is grossly normal.  LEFT VENTRICLE PLAX 2D LVIDd:         4.50 cm  Diastology LVIDs:         2.90 cm  LV e' lateral: 9.14 cm/s LV PW:         1.20 cm  LV e' medial:  8.81 cm/s LV IVS:        1.00 cm LVOT diam:     2.10 cm LV SV:         59 LV SV Index:   29 LVOT Area:     3.46 cm  RIGHT VENTRICLE RV S prime:     15.30 cm/s TAPSE (M-mode): 1.6 cm LEFT ATRIUM             Index       RIGHT ATRIUM          Index LA diam:        4.90 cm 2.41 cm/m  RA Area:     8.81 cm LA Vol (A2C):   38.0 ml 18.68 ml/m RA Volume:   14.80 ml 7.27 ml/m LA Vol (A4C):   37.6 ml 18.48 ml/m LA Biplane Vol: 37.8 ml  18.58 ml/m  AORTIC VALVE AV Area (Vmax):    1.14 cm AV Area (Vmean):   1.20 cm AV Area (VTI):     1.43 cm AV Vmax:           222.61 cm/s AV Vmean:            149.520 cm/s AV VTI:            0.411 m AV Peak Grad:      19.8 mmHg AV Mean Grad:      9.9 mmHg LVOT Vmax:         73.10 cm/s LVOT Vmean:        51.800 cm/s LVOT VTI:          0.169 m LVOT/AV VTI ratio: 0.41 TRICUSPID VALVE TR Peak grad:   21.3 mmHg TR Vmax:        231.00 cm/s  SHUNTS Systemic VTI:  0.17 m Systemic Diam: 2.10 cm [place] O'Neal MD Electronically signed by McDonald O'Neal MD Signature Date/Time: 06/17/2019/3:40:51 PM    Final      I have independently reviewed the above radiologic studies and discussed with the patient   Recent Lab Findings: Lab Results  Component Value Date   WBC 8.6 06/18/2019   HGB 13.5 06/18/2019   HCT 39.0 06/18/2019   PLT 278 06/18/2019   GLUCOSE 198 (H) 06/18/2019   CHOL 214 (H) 06/17/2019   TRIG 113 06/17/2019   HDL 35 (L) 06/17/2019   LDLCALC 156 (H) 06/17/2019   NA 138 06/18/2019   K 3.9 06/18/2019   CL 105 06/18/2019   CREATININE 0.50 06/18/2019   BUN 13 06/18/2019   CO2 25 06/18/2019   HGBA1C 10.2 (H) 06/17/2019   Conclusion: cardiac cath    There is mild left ventricular systolic dysfunction. The left ventricular ejection fraction is 35-45% by visual estimate.  LV end diastolic pressure is mildly elevated.  There is mild (2+) mitral regurgitation.  --------------------------------  Prox LAD to Mid LAD lesion is 90% stenosed. Mid LAD lesion is 75% stenosed. Dist LAD lesion is 85% stenosed.  Ramus lesion is 65% stenosed.  Dist RCA lesion is 100% stenosed like via proximal edge of previously placed bare-metal stent   SUMMARY  Severe three-vessel disease: 100% occlusion of small distal RCA proximal to previously placed stent, diffuse 70-90% mid LAD stenosis of wraparound LAD, diffuse 65% proximal large ramus intermedius  Mildly reduced EF with basal inferior hypo-/akinesis  and anterolateral hypokinesis.  Borderline elevated LVEDP  RECOMMENDATIONS  Sheath will be removed in PACU holding area with manual pressure held for hemostasis.  Transfer to cardiac telemetry unit for post sheath removal care.  CVTS consultation recommended based on three-vessel CAD -> existing disease is not favorable for PCI with long LAD lesion   David Harding, MD        Assessment / Plan: Severe three-vessel coronary artery disease. Type 2 diabetes-insulin-dependent Hypertension Hyperlipidemia Morbid obesity status post previous gastric sleeve. Previous stent placement in the right coronary artery. Previous myocardial infarction 2009    We will get preop vascular studies.  Plan: CABG    I  spent 60 minutes counseling the patient face to face.   Wayne E Gold, PA-C 06/18/2019 3:51 PM  Patient seen and examined, records and cath images reviewed. Agree with above.  She is a 43 yo woman with a past history of insulin dependent diabetes, known CAD with prior stent, mild AS, obesity, hypertension, hyperlipidemia and peripheral arterial disease.  She presented with unstable chest pain, but ruled out for MI. Cath showed severe 3 VD with a totally occluded RCA, 90% long segment LAD and 65% stenosis of a large ramus intermedius supplying the lateral wall. Not favorable for PCI, therefore referred for CABG.  CABG is indicated for survival benefit and relief of symptoms. Conduit   may be an issue as she has small radial arteries and varicosities of right leg vein. Will plan to assess left radial intraoperatively to see if it is usable and also plan to use left saphenous in addition to LIMA. She did have a normal Allen's on the left.  I discussed the general nature of the procedure, including the need for general anesthesia, the incisions to be used, the use of cardiopulmonary bypass, and the use of drainage tubes postoperatively with Ms. Haq. We discussed the expected  hospital stay, overall recovery and short and long term outcomes. She understands this is a palliative, not a curative operation. I informed her of the indications, risks, benefits and alternatives. She understands the risks include, but are not limited to death, stroke, MI, DVT/PE, bleeding, possible need for transfusion, infections, cardiac arrhythmias as well as other organ system dysfunction including respiratory, renal, or GI complications.   She will discuss with her family before making a decision  If she decides to proceed will do surgery in AM tomorrow.   Dajai Wahlert C. Kendall Arnell, MD [facility] [phone]   

## 2019-06-18 NOTE — H&P (View-Only) (Signed)
 Progress Note  Patient Name: Beverly [name] Date of Encounter: 06/18/2019  Primary Cardiologist: No primary care provider on file.   Subjective   No chest pain this morning.   Inpatient Medications    Scheduled Meds: . aspirin  81 mg Oral Daily  . atorvastatin  40 mg Oral q1800  . carvedilol  12.5 mg Oral q AM  . enoxaparin (LOVENOX) injection  40 mg Subcutaneous Q24H  . gabapentin  300 mg Oral QHS  . insulin aspart  300 Units Subcutaneous Once  . insulin pump   Subcutaneous TID WC, HS, 0200  . losartan  100 mg Oral Daily  . sodium chloride flush  3 mL Intravenous Once   Continuous Infusions: . sodium chloride 1 mL/kg/hr (06/18/19 0525)  . famotidine (PEPCID) IV 20 mg (06/18/19 0833)   PRN Meds: acetaminophen, alum & mag hydroxide-simeth, nitroGLYCERIN, ondansetron (ZOFRAN) IV   Vital Signs    Vitals:   06/17/19 1736 06/18/19 0033 06/18/19 0053 06/18/19 0620  BP: (!) 142/64 (!) 126/58 (!) 109/50 122/71  Pulse: 72 91 75 72  Resp: 16 14 17 16  Temp: 98.1 F (36.7 C) 98.6 F (37 C) 98.1 F (36.7 C) 97.8 F (36.6 C)  TempSrc: Oral Oral Oral Oral  SpO2: 100% 97% 92% 99%  Weight:      Height:        Intake/Output Summary (Last 24 hours) at 06/18/2019 1010 Last data filed at 06/18/2019 0500 Gross per 24 hour  Intake 473.33 ml  Output --  Net 473.33 ml   Last 3 Weights 06/16/2019  Weight (lbs) 245 lb  Weight (kg) 111.131 kg      Telemetry    SR - Personally Reviewed  ECG    SR with prior inferior infarct - Personally Reviewed  Physical Exam  Pleasant younger WF, sitting up in chair.  GEN: No acute distress.   Neck: No JVD Cardiac: RRR, no murmurs, rubs, or gallops.  Respiratory: Clear to auscultation bilaterally. GI: Soft, nontender, non-distended  MS: No edema; No deformity. Neuro:  Nonfocal  Psych: Normal affect   Labs    High Sensitivity Troponin:   Recent Labs  Lab 06/16/19 1620 06/16/19 1949  TROPONINIHS 14 16       Chemistry Recent Labs  Lab 06/16/19 1620 06/17/19 0500 06/18/19 0141  NA 137 141 138  K 3.9 3.4* 3.9  CL 100 107 105  CO2 28 25 25  GLUCOSE 227* 173* 198*  BUN 11 10 13  CREATININE 0.51 0.49 0.50  CALCIUM 9.3 8.0* 8.8*  GFRNONAA >60 >60 >60  GFRAA >60 >60 >60  ANIONGAP 9 9 8     Hematology Recent Labs  Lab 06/17/19 0500 06/17/19 1754 06/18/19 0141  WBC 5.3 8.3 8.6  RBC 3.34*  3.31* 4.77 4.46  HGB 10.0* 14.3 13.5  HCT 30.2* 42.0 39.0  MCV 90.4 88.1 87.4  MCH 29.9 30.0 30.3  MCHC 33.1 34.0 34.6  RDW 12.6 12.6 12.6  PLT 209 330 278    BNPNo results for input(s): BNP, PROBNP in the last 168 hours.   DDimer  Recent Labs  Lab 06/16/19 2200  DDIMER 0.81*     Radiology    DG Chest 2 View  Result Date: 06/16/2019 CLINICAL DATA:  Chest pain, neck pain, nausea, history MI EXAM: CHEST - 2 VIEW COMPARISON:  01/18/2018 FINDINGS: Normal heart size, mediastinal contours, and pulmonary vascularity. Atherosclerotic calcification aorta. Lungs clear. No pulmonary infiltrate, pleural effusion or pneumothorax. Bones   unremarkable. IMPRESSION: No acute abnormalities. Aortic Atherosclerosis (ICD10-I70.0). Electronically Signed   By: Ulyses Southward M.D.   On: 06/16/2019 16:40   CT Angio Chest PE W and/or Wo Contrast  Result Date: 06/17/2019 CLINICAL DATA:  Neck pain, chest pain, nausea EXAM: CT ANGIOGRAPHY CHEST WITH CONTRAST TECHNIQUE: Multidetector CT imaging of the chest was performed using the standard protocol during bolus administration of intravenous contrast. Multiplanar CT image reconstructions and MIPs were obtained to evaluate the vascular anatomy. CONTRAST:  76mL OMNIPAQUE IOHEXOL 350 MG/ML SOLN COMPARISON:  None. FINDINGS: Cardiovascular: Cardiomegaly. No filling defects in the pulmonary arteries to suggest pulmonary emboli. Heart is normal size. Diffuse coronary artery calcifications. Aortic atherosclerosis. Mediastinum/Nodes: No mediastinal, hilar, or axillary adenopathy.  Trachea and esophagus are unremarkable. Thyroid unremarkable. Lungs/Pleura: Lungs are clear. No focal airspace opacities or suspicious nodules. No effusions. Upper Abdomen: Imaging into the upper abdomen shows no acute findings. Changes of prior gastric sleeve. Musculoskeletal: Chest wall soft tissues are unremarkable. No acute bony abnormality. Review of the MIP images confirms the above findings. IMPRESSION: No evidence of pulmonary embolus. Diffuse coronary artery disease, advanced for patient's age. Cardiomegaly. No acute cardiopulmonary disease. Aortic Atherosclerosis (ICD10-I70.0). Electronically Signed   By: Charlett Nose M.D.   On: 06/17/2019 00:35   ECHOCARDIOGRAM COMPLETE  Result Date: 06/17/2019    ECHOCARDIOGRAM REPORT   Patient Name:   Beverly [name] Seefeldt Date of Exam: 06/17/2019 Medical Rec #:  [id number]      Height:       60.0 in Accession #:    [id number]     Weight:       245.0 lb Date of Birth:  1975/11/06      BSA:          2.035 m Patient Age:    43 years       BP:           130/73 mmHg Patient Gender: F              HR:           74 bpm. Exam Location:  Inpatient Procedure: 2D Echo Indications:    chest pain 786.50  History:        Patient has no prior history of Echocardiogram examinations.                 CAD, Signs/Symptoms:Chest Pain; Risk Factors:Diabetes and                 Hypertension.  Sonographer:    Delcie Roch Referring Phys: [phone] KAREN M BLACK  Sonographer Comments: Image acquisition challenging due to patient body habitus. IMPRESSIONS  1. Left ventricular ejection fraction, by estimation, is 55 to 60%. The left ventricle has normal function. The left ventricle has no regional wall motion abnormalities. Left ventricular diastolic function could not be evaluated.  2. Right ventricular systolic function is normal. The right ventricular size is normal. Tricuspid regurgitation signal is inadequate for assessing PA pressure.  3. The mitral valve is degenerative. No evidence of mitral  valve regurgitation. No evidence of mitral stenosis.  4. The aortic valve is tricuspid. Aortic valve regurgitation is not visualized. Mild aortic valve stenosis.  5. There is mild (Grade II) layered plaque involving the aortic root. FINDINGS  Left Ventricle: Left ventricular ejection fraction, by estimation, is 55 to 60%. The left ventricle has normal function. The left ventricle has no regional wall motion abnormalities. The left ventricular internal cavity size was normal in size. There  is  no left ventricular hypertrophy. Left ventricular diastolic function could not be evaluated. Right Ventricle: The right ventricular size is normal. No increase in right ventricular wall thickness. Right ventricular systolic function is normal. Tricuspid regurgitation signal is inadequate for assessing PA pressure. Left Atrium: Left atrial size was normal in size. Right Atrium: Right atrial size was normal in size. Pericardium: There is no evidence of pericardial effusion. Presence of pericardial fat pad. Mitral Valve: The mitral valve is degenerative in appearance. Mild mitral annular calcification. No evidence of mitral valve regurgitation. No evidence of mitral valve stenosis. Tricuspid Valve: The tricuspid valve is grossly normal. Tricuspid valve regurgitation is trivial. No evidence of tricuspid stenosis. Aortic Valve: The aortic valve is tricuspid. Aortic valve regurgitation is not visualized. Mild aortic stenosis is present. Aortic valve mean gradient measures 9.9 mmHg. Aortic valve peak gradient measures 19.8 mmHg. Aortic valve area, by VTI measures 1.43 cm. Pulmonic Valve: The pulmonic valve was grossly normal. Pulmonic valve regurgitation is not visualized. No evidence of pulmonic stenosis. Aorta: The aortic root is normal in size and structure. There is mild (Grade II) layered plaque involving the aortic root. Venous: The inferior vena cava was not well visualized. IAS/Shunts: The atrial septum is grossly normal.   LEFT VENTRICLE PLAX 2D LVIDd:         4.50 cm  Diastology LVIDs:         2.90 cm  LV e' lateral: 9.14 cm/s LV PW:         1.20 cm  LV e' medial:  8.81 cm/s LV IVS:        1.00 cm LVOT diam:     2.10 cm LV SV:         59 LV SV Index:   29 LVOT Area:     3.46 cm  RIGHT VENTRICLE RV S prime:     15.30 cm/s TAPSE (M-mode): 1.6 cm LEFT ATRIUM             Index       RIGHT ATRIUM          Index LA diam:        4.90 cm 2.41 cm/m  RA Area:     8.81 cm LA Vol (A2C):   38.0 ml 18.68 ml/m RA Volume:   14.80 ml 7.27 ml/m LA Vol (A4C):   37.6 ml 18.48 ml/m LA Biplane Vol: 37.8 ml 18.58 ml/m  AORTIC VALVE AV Area (Vmax):    1.14 cm AV Area (Vmean):   1.20 cm AV Area (VTI):     1.43 cm AV Vmax:           222.61 cm/s AV Vmean:          149.520 cm/s AV VTI:            0.411 m AV Peak Grad:      19.8 mmHg AV Mean Grad:      9.9 mmHg LVOT Vmax:         73.10 cm/s LVOT Vmean:        51.800 cm/s LVOT VTI:          0.169 m LVOT/AV VTI ratio: 0.41 TRICUSPID VALVE TR Peak grad:   21.3 mmHg TR Vmax:        231.00 cm/s  SHUNTS Systemic VTI:  0.17 m Systemic Diam: 2.10 cm Lennie Odor MD Electronically signed by Lennie Odor MD Signature Date/Time: 06/17/2019/3:40:51 PM    Final     Cardiac Studies  Echo: 06/17/19  IMPRESSIONS    1. Left ventricular ejection fraction, by estimation, is 55 to 60%. The  left ventricle has normal function. The left ventricle has no regional  wall motion abnormalities. Left ventricular diastolic function could not  be evaluated.  2. Right ventricular systolic function is normal. The right ventricular  size is normal. Tricuspid regurgitation signal is inadequate for assessing  PA pressure.  3. The mitral valve is degenerative. No evidence of mitral valve  regurgitation. No evidence of mitral stenosis.  4. The aortic valve is tricuspid. Aortic valve regurgitation is not  visualized. Mild aortic valve stenosis.  5. There is mild (Grade II) layered plaque involving the aortic  root.   Patient Profile     43 y.o. female  with a hx of DM II on insulin pump, CAD s/p stent 2009, HTN, HLD, h/o DVT, morbid obesity s/p bariatric surgery and PCOS who is being seen today for the evaluation of chest pain at the request of Dr. Goodrich.  Assessment & Plan    1. Chest pain with nausea and vomiting: Had both typical and atypical features. Symptoms were concerning for her anginal equivalent. hsTn 14>>16. Planned for cardiac cath today. No chest pain overnight.   2. CAD: She has history of early CAD at age 31 when she had the first stent. Cath was done at [facility].   3. HTN: blood pressures are stable.  4. HL: LDL 156, reports missing several doses of statin prior to admission. Will further increase atorvastatin to 80mg. Reports having issues with higher dose statins in the past. May need to consider PCSK9s.   5. DM: on insulin pump. Hgb A1c 10. Does not appear she has been on oral therapy prior to admission. Reports trying metformin in the past with GI upset.  -- would consider Jardiance prior to discharge.   6. Morbid obesity s/p bariatric surgery  For questions or updates, please contact [facility] Please consult [email or web address] for contact info under   Signed, Lindsay Roberts, NP  06/18/2019, 10:10 AM    I have personally seen and examined this patient. I agree with the assessment and plan as outlined above.  Chest pain free today. Troponin negative but given recent symptoms with known CAD, no recent ischemic testing and risk factors, will plan cardiac cath today.   Beverly [name] 06/18/2019 11:22 AM   

## 2019-06-18 NOTE — Progress Notes (Signed)
Site Area: RFA Site Prior to Removal: Level 0 Pressure Applied For:   30  minutes Manual: yes Patient Status During Pull: stable Post Pull Site: Level 0 Post Pull Instructions Given: YES Post Pull Pulses Present: yes Dressing Applied: gauze and tegaderm Bedrest begins @ 1600 till 2000 Comments:  Removed by  Griffith Citron, RN, RCIS

## 2019-06-18 NOTE — Progress Notes (Signed)
Progress Note    VONNA BRABSON  XHB:[id number] DOB: Feb 07, 1976  DOA: 06/16/2019 PCP: [name], MD    Brief Narrative:     Medical records reviewed and are as summarized below:  PHYLISHA DIX is an 44 y.o. female  with medical history significant for CAD with stent, insulin-dependent diabetes mellitus, hypertension, history of bariatric surgery, and history of lower extremity DVT after surgery no longer anticoagulated, now presenting to emergency department for evaluation of chest pain.  The patient reports a few days of waxing and waning chest pain.  Symptoms initially felt more like indigestion but she began to experience an ache in her left jaw that she reports to be similar to when she had a heart attack and required stent.   Assessment/Plan:   Principal Problem:   Chest pain Active Problems:   CAD (coronary artery disease)   Hypertension   Insulin-requiring or dependent type II diabetes mellitus (HCC)   History of DVT (deep vein thrombosis)   Positive D dimer   Obesity   Hypokalemia   Anemia    Unstable angina  -  High-sensitivity troponin negative x2.   -Chest pain-free -Echocardiogram reassuring -Plan for left heart cath today -Aspirin/statin/Coreg  Diabetes.  Patient is on insulin pump.  Serum glucose 227 on admission. -A1c: 8.2 -Cardiology recommends Jardiance, will ask for cost analysis from [facility]   Hypertension.  -coreg, losartan    Hypokalemia.  Repleted Elevated D-dimer.   -History of DVT.  Patient's not hypoxic.  CT angio chest negative for PE   Obesity.  - status post bariatric surgery Estimated body mass index is 47.85 kg/m as calculated from the following:   Height as of this encounter: 5' (1.524 m).   Weight as of this encounter: 111.1 kg.      Family Communication/Anticipated D/C date and plan/Code Status   DVT prophylaxis: Lovenox ordered. Code Status: Full Code.  Family Communication:  Disposition Plan: Await left heart  cath  Medical Consultants:    Cardiology     Subjective:   No current chest pain  Objective:    Vitals:   06/17/19 1736 06/18/19 0033 06/18/19 0053 06/18/19 0620  BP: (!) 142/64 (!) 126/58 (!) 109/50 122/71  Pulse: 72 91 75 72  Resp: 16 14 17 16   Temp: 98.1 F (36.7 C) 98.6 F (37 C) 98.1 F (36.7 C) 97.8 F (36.6 C)  TempSrc: Oral Oral Oral Oral  SpO2: 100% 97% 92% 99%  Weight:      Height:        Intake/Output Summary (Last 24 hours) at 06/18/2019 1123 Last data filed at 06/18/2019 0500 Gross per 24 hour  Intake 473.33 ml  Output --  Net 473.33 ml   Filed Weights   06/16/19 2304  Weight: 111.1 kg    Exam: Obese female, no acute distress, sitting in chair Regular rate and rhythm No increased work of breathing Positive bowel sounds, obese abdomen Alert and orient x3  Data Reviewed:   I have personally reviewed following labs and imaging studies:  Labs: Labs show the following:   Basic Metabolic Panel: Recent Labs  Lab 06/16/19 1620 06/16/19 1620 06/17/19 0500 06/18/19 0141  NA 137  --  141 138  K 3.9   < > 3.4* 3.9  CL 100  --  107 105  CO2 28  --  25 25  GLUCOSE 227*  --  173* 198*  BUN 11  --  10 13  CREATININE  0.51  --  0.49 0.50  CALCIUM 9.3  --  8.0* 8.8*   < > = values in this interval not displayed.   GFR Estimated Creatinine Clearance: 101.6 mL/min (by C-G formula based on SCr of 0.5 mg/dL). Liver Function Tests: No results for input(s): AST, ALT, ALKPHOS, BILITOT, PROT, ALBUMIN in the last 168 hours. No results for input(s): LIPASE, AMYLASE in the last 168 hours. No results for input(s): AMMONIA in the last 168 hours. Coagulation profile No results for input(s): INR, PROTIME in the last 168 hours.  CBC: Recent Labs  Lab 06/16/19 1620 06/17/19 0500 06/17/19 1754 06/18/19 0141  WBC 8.3 5.3 8.3 8.6  HGB 14.4 10.0* 14.3 13.5  HCT 42.1 30.2* 42.0 39.0  MCV 88.3 90.4 88.1 87.4  PLT 308 209 330 278   Cardiac  Enzymes: No results for input(s): CKTOTAL, CKMB, CKMBINDEX, TROPONINI in the last 168 hours. BNP (last 3 results) No results for input(s): PROBNP in the last 8760 hours. CBG: Recent Labs  Lab 06/17/19 2144 06/18/19 0224 06/18/19 0618 06/18/19 0757 06/18/19 1106  GLUCAP 205* 173* 151* 157* 180*   D-Dimer: Recent Labs    06/16/19 2200  DDIMER 0.81*   Hgb A1c: Recent Labs    06/17/19 0500  HGBA1C 10.2*   Lipid Profile: Recent Labs    06/17/19 0500  CHOL 214*  HDL 35*  LDLCALC 156*  TRIG 113  CHOLHDL 6.1   Thyroid function studies: No results for input(s): TSH, T4TOTAL, T3FREE, THYROIDAB in the last 72 hours.  Invalid input(s): FREET3 Anemia work up: Recent Labs    06/17/19 0500  VITAMINB12 314  FOLATE 29.7  FERRITIN 29  TIBC 323  IRON 100  RETICCTPCT 2.1   Sepsis Labs: Recent Labs  Lab 06/16/19 1620 06/17/19 0500 06/17/19 1754 06/18/19 0141  WBC 8.3 5.3 8.3 8.6    Microbiology Recent Results (from the past 240 hour(s))  SARS CORONAVIRUS 2 (TAT 6-24 HRS) Nasopharyngeal Nasopharyngeal Swab     Status: None   Collection Time: 06/16/19 11:38 PM   Specimen: Nasopharyngeal Swab  Result Value Ref Range Status   SARS Coronavirus 2 NEGATIVE NEGATIVE Final    Comment: (NOTE) SARS-CoV-2 target nucleic acids are NOT DETECTED. The SARS-CoV-2 RNA is generally detectable in upper and lower respiratory specimens during the acute phase of infection. Negative results do not preclude SARS-CoV-2 infection, do not rule out co-infections with other pathogens, and should not be used as the sole basis for treatment or other patient management decisions. Negative results must be combined with clinical observations, patient history, and epidemiological information. The expected result is Negative. Fact Sheet for Patients: HairSlick.no Fact Sheet for Healthcare Providers: quierodirigir.com This test is not yet  approved or cleared by the [place] FDA and  has been authorized for detection and/or diagnosis of SARS-CoV-2 by FDA under an Emergency Use Authorization (EUA). This EUA will remain  in effect (meaning this test can be used) for the duration of the COVID-19 declaration under Section 56 4(b)(1) of the Act, 21 U.S.C. section 360bbb-3(b)(1), unless the authorization is terminated or revoked sooner. Performed at [facility], [address], [place], [place] [address]   Surgical pcr screen     Status: None   Collection Time: 06/18/19  3:12 AM   Specimen: Nasal Mucosa; Nasal Swab  Result Value Ref Range Status   MRSA, PCR NEGATIVE NEGATIVE Final   Staphylococcus aureus NEGATIVE NEGATIVE Final    Comment: (NOTE) The Xpert SA Assay (  FDA approved for NASAL specimens in patients 45 years of age and older), is one component of a comprehensive surveillance program. It is not intended to diagnose infection nor to guide or monitor treatment. Performed at [facility], [place] [address], [place], [place] [address]     Procedures and diagnostic studies:  DG Chest 2 View  Result Date: 06/16/2019 CLINICAL DATA:  Chest pain, neck pain, nausea, history MI EXAM: CHEST - 2 VIEW COMPARISON:  01/18/2018 FINDINGS: Normal heart size, mediastinal contours, and pulmonary vascularity. Atherosclerotic calcification aorta. Lungs clear. No pulmonary infiltrate, pleural effusion or pneumothorax. Bones unremarkable. IMPRESSION: No acute abnormalities. Aortic Atherosclerosis (ICD10-I70.0). Electronically Signed   By: Lavonia Dana M.D.   On: 06/16/2019 16:40   CT Angio Chest PE W and/or Wo Contrast  Result Date: 06/17/2019 CLINICAL DATA:  Neck pain, chest pain, nausea EXAM: CT ANGIOGRAPHY CHEST WITH CONTRAST TECHNIQUE: Multidetector CT imaging of the chest was performed using the standard protocol during bolus administration of intravenous contrast. Multiplanar CT image reconstructions and MIPs were  obtained to evaluate the vascular anatomy. CONTRAST:  63mL OMNIPAQUE IOHEXOL 350 MG/ML SOLN COMPARISON:  None. FINDINGS: Cardiovascular: Cardiomegaly. No filling defects in the pulmonary arteries to suggest pulmonary emboli. Heart is normal size. Diffuse coronary artery calcifications. Aortic atherosclerosis. Mediastinum/Nodes: No mediastinal, hilar, or axillary adenopathy. Trachea and esophagus are unremarkable. Thyroid unremarkable. Lungs/Pleura: Lungs are clear. No focal airspace opacities or suspicious nodules. No effusions. Upper Abdomen: Imaging into the upper abdomen shows no acute findings. Changes of prior gastric sleeve. Musculoskeletal: Chest wall soft tissues are unremarkable. No acute bony abnormality. Review of the MIP images confirms the above findings. IMPRESSION: No evidence of pulmonary embolus. Diffuse coronary artery disease, advanced for patient's age. Cardiomegaly. No acute cardiopulmonary disease. Aortic Atherosclerosis (ICD10-I70.0). Electronically Signed   By: Rolm Baptise M.D.   On: 06/17/2019 00:35   ECHOCARDIOGRAM COMPLETE  Result Date: 06/17/2019    ECHOCARDIOGRAM REPORT   Patient Name:   JACKALYNN ART Cohen Date of Exam: 06/17/2019 Medical Rec #:  [id number]      Height:       60.0 in Accession #:    [id number]     Weight:       245.0 lb Date of Birth:  12-11-1975      BSA:          2.035 m Patient Age:    59 years       BP:           130/73 mmHg Patient Gender: F              HR:           74 bpm. Exam Location:  Inpatient Procedure: 2D Echo Indications:    chest pain 786.50  History:        Patient has no prior history of Echocardiogram examinations.                 CAD, Signs/Symptoms:Chest Pain; Risk Factors:Diabetes and                 Hypertension.  Sonographer:    Johny Chess Referring Phys: [phone] KAREN M BLACK  Sonographer Comments: Image acquisition challenging due to patient body habitus. IMPRESSIONS  1. Left ventricular ejection fraction, by estimation, is 55 to 60%. The  left ventricle has normal function. The left ventricle has no regional wall motion abnormalities. Left ventricular diastolic function could not be evaluated.  2. Right ventricular systolic function is  normal. The right ventricular size is normal. Tricuspid regurgitation signal is inadequate for assessing PA pressure.  3. The mitral valve is degenerative. No evidence of mitral valve regurgitation. No evidence of mitral stenosis.  4. The aortic valve is tricuspid. Aortic valve regurgitation is not visualized. Mild aortic valve stenosis.  5. There is mild (Grade II) layered plaque involving the aortic root. FINDINGS  Left Ventricle: Left ventricular ejection fraction, by estimation, is 55 to 60%. The left ventricle has normal function. The left ventricle has no regional wall motion abnormalities. The left ventricular internal cavity size was normal in size. There is  no left ventricular hypertrophy. Left ventricular diastolic function could not be evaluated. Right Ventricle: The right ventricular size is normal. No increase in right ventricular wall thickness. Right ventricular systolic function is normal. Tricuspid regurgitation signal is inadequate for assessing PA pressure. Left Atrium: Left atrial size was normal in size. Right Atrium: Right atrial size was normal in size. Pericardium: There is no evidence of pericardial effusion. Presence of pericardial fat pad. Mitral Valve: The mitral valve is degenerative in appearance. Mild mitral annular calcification. No evidence of mitral valve regurgitation. No evidence of mitral valve stenosis. Tricuspid Valve: The tricuspid valve is grossly normal. Tricuspid valve regurgitation is trivial. No evidence of tricuspid stenosis. Aortic Valve: The aortic valve is tricuspid. Aortic valve regurgitation is not visualized. Mild aortic stenosis is present. Aortic valve mean gradient measures 9.9 mmHg. Aortic valve peak gradient measures 19.8 mmHg. Aortic valve area, by VTI measures  1.43 cm. Pulmonic Valve: The pulmonic valve was grossly normal. Pulmonic valve regurgitation is not visualized. No evidence of pulmonic stenosis. Aorta: The aortic root is normal in size and structure. There is mild (Grade II) layered plaque involving the aortic root. Venous: The inferior vena cava was not well visualized. IAS/Shunts: The atrial septum is grossly normal.  LEFT VENTRICLE PLAX 2D LVIDd:         4.50 cm  Diastology LVIDs:         2.90 cm  LV e' lateral: 9.14 cm/s LV PW:         1.20 cm  LV e' medial:  8.81 cm/s LV IVS:        1.00 cm LVOT diam:     2.10 cm LV SV:         59 LV SV Index:   29 LVOT Area:     3.46 cm  RIGHT VENTRICLE RV S prime:     15.30 cm/s TAPSE (M-mode): 1.6 cm LEFT ATRIUM             Index       RIGHT ATRIUM          Index LA diam:        4.90 cm 2.41 cm/m  RA Area:     8.81 cm LA Vol (A2C):   38.0 ml 18.68 ml/m RA Volume:   14.80 ml 7.27 ml/m LA Vol (A4C):   37.6 ml 18.48 ml/m LA Biplane Vol: 37.8 ml 18.58 ml/m  AORTIC VALVE AV Area (Vmax):    1.14 cm AV Area (Vmean):   1.20 cm AV Area (VTI):     1.43 cm AV Vmax:           222.61 cm/s AV Vmean:          149.520 cm/s AV VTI:            0.411 m AV Peak Grad:      19.8 mmHg  AV Mean Grad:      9.9 mmHg LVOT Vmax:         73.10 cm/s LVOT Vmean:        51.800 cm/s LVOT VTI:          0.169 m LVOT/AV VTI ratio: 0.41 TRICUSPID VALVE TR Peak grad:   21.3 mmHg TR Vmax:        231.00 cm/s  SHUNTS Systemic VTI:  0.17 m Systemic Diam: 2.10 cm Lennie OdorWesley O'Neal MD Electronically signed by Lennie OdorWesley O'Neal MD Signature Date/Time: 06/17/2019/3:40:51 PM    Final     Medications:   . aspirin  81 mg Oral Daily  . atorvastatin  80 mg Oral q1800  . carvedilol  12.5 mg Oral q AM  . enoxaparin (LOVENOX) injection  40 mg Subcutaneous Q24H  . famotidine  20 mg Oral BID  . gabapentin  300 mg Oral QHS  . insulin pump   Subcutaneous TID WC, HS, 0200  . losartan  100 mg Oral Daily  . sodium chloride flush  3 mL Intravenous Once   Continuous  Infusions: . sodium chloride 1 mL/kg/hr (06/18/19 0525)     LOS: 0 days   Joseph ArtJessica U [place]  [facility]   How to contact the [facility] Attending or Consulting provider 7A - 7P or covering provider during after hours 7P -7A, for this patient?  1. Check the care team in [facility] and look for a) attending/consulting [facility] provider listed and b) the [facility] team listed 2. Log into [email or web address] and use [facility]'s universal password to access. If you do not have the password, please contact the hospital operator. 3. Locate the [facility] provider you are looking for under [facility] and page to a number that you can be directly reached. 4. If you still have difficulty reaching the provider, please page the [facility] (Director on Call) for the Hospitalists listed on amion for assistance.  06/18/2019, 11:23 AM

## 2019-06-19 ENCOUNTER — Inpatient Hospital Stay (HOSPITAL_COMMUNITY): Payer: 59 | Admitting: Certified Registered Nurse Anesthetist

## 2019-06-19 ENCOUNTER — Inpatient Hospital Stay (HOSPITAL_COMMUNITY)
Admission: EM | Disposition: A | Discharge status: Home / Self Care | Payer: Self-pay | Source: Home / Self Care | Attending: Thoracic Surgery (Cardiothoracic Vascular Surgery)

## 2019-06-19 ENCOUNTER — Inpatient Hospital Stay (HOSPITAL_COMMUNITY): Payer: 59

## 2019-06-19 DIAGNOSIS — Z951 Presence of aortocoronary bypass graft: Secondary | ICD-10-CM

## 2019-06-19 HISTORY — PX: CORONARY ARTERY BYPASS GRAFT: SHX141

## 2019-06-19 HISTORY — PX: APPLICATION OF WOUND VAC: SHX5189

## 2019-06-19 HISTORY — DX: Presence of aortocoronary bypass graft: Z95.1

## 2019-06-19 HISTORY — PX: TEE WITHOUT CARDIOVERSION: SHX5443

## 2019-06-19 LAB — CBC
HCT: 37.9 % (ref 36.0–46.0)
HCT: 34.6 % — ABNORMAL LOW (ref 36.0–46.0)
HCT: 35.3 % — ABNORMAL LOW (ref 36.0–46.0)
Hemoglobin: 13.1 g/dL (ref 12.0–15.0)
Hemoglobin: 11.9 g/dL — ABNORMAL LOW (ref 12.0–15.0)
Hemoglobin: 12 g/dL (ref 12.0–15.0)
MCH: 30.3 pg (ref 26.0–34.0)
MCH: 30.6 pg (ref 26.0–34.0)
MCH: 30.7 pg (ref 26.0–34.0)
MCHC: 34.6 g/dL (ref 30.0–36.0)
MCHC: 34.4 g/dL (ref 30.0–36.0)
MCHC: 34 g/dL (ref 30.0–36.0)
MCV: 87.7 fL (ref 80.0–100.0)
MCV: 88.9 fL (ref 80.0–100.0)
MCV: 90.3 fL (ref 80.0–100.0)
Platelets: 272 K/uL (ref 150–400)
Platelets: 231 10*3/uL (ref 150–400)
Platelets: 253 10*3/uL (ref 150–400)
RBC: 4.32 MIL/uL (ref 3.87–5.11)
RBC: 3.89 MIL/uL (ref 3.87–5.11)
RBC: 3.91 MIL/uL (ref 3.87–5.11)
RDW: 12.3 % (ref 11.5–15.5)
RDW: 12.4 % (ref 11.5–15.5)
RDW: 12.5 % (ref 11.5–15.5)
WBC: 7.1 K/uL (ref 4.0–10.5)
WBC: 18.2 10*3/uL — ABNORMAL HIGH (ref 4.0–10.5)
WBC: 18.9 10*3/uL — ABNORMAL HIGH (ref 4.0–10.5)
nRBC: 0 % (ref 0.0–0.2)
nRBC: 0 % (ref 0.0–0.2)
nRBC: 0 % (ref 0.0–0.2)

## 2019-06-19 LAB — POCT I-STAT 7, (LYTES, BLD GAS, ICA,H+H)
Acid-base deficit: 2 mmol/L (ref 0.0–2.0)
Acid-base deficit: 8 mmol/L — ABNORMAL HIGH (ref 0.0–2.0)
Acid-base deficit: 9 mmol/L — ABNORMAL HIGH (ref 0.0–2.0)
Acid-base deficit: 4 mmol/L — ABNORMAL HIGH (ref 0.0–2.0)
Bicarbonate: 23.9 mmol/L (ref 20.0–28.0)
Bicarbonate: 17.3 mmol/L — ABNORMAL LOW (ref 20.0–28.0)
Bicarbonate: 16.8 mmol/L — ABNORMAL LOW (ref 20.0–28.0)
Bicarbonate: 21 mmol/L (ref 20.0–28.0)
Calcium, Ion: 1.17 mmol/L (ref 1.15–1.40)
Calcium, Ion: 1.02 mmol/L — ABNORMAL LOW (ref 1.15–1.40)
Calcium, Ion: 1.05 mmol/L — ABNORMAL LOW (ref 1.15–1.40)
Calcium, Ion: 1.07 mmol/L — ABNORMAL LOW (ref 1.15–1.40)
HCT: 33 % — ABNORMAL LOW (ref 36.0–46.0)
HCT: 28 % — ABNORMAL LOW (ref 36.0–46.0)
HCT: 29 % — ABNORMAL LOW (ref 36.0–46.0)
HCT: 28 % — ABNORMAL LOW (ref 36.0–46.0)
Hemoglobin: 11.2 g/dL — ABNORMAL LOW (ref 12.0–15.0)
Hemoglobin: 9.5 g/dL — ABNORMAL LOW (ref 12.0–15.0)
Hemoglobin: 9.9 g/dL — ABNORMAL LOW (ref 12.0–15.0)
Hemoglobin: 9.5 g/dL — ABNORMAL LOW (ref 12.0–15.0)
O2 Saturation: 98 %
O2 Saturation: 98 %
O2 Saturation: 98 %
O2 Saturation: 96 %
Patient temperature: 97.4
Patient temperature: 97.5
Patient temperature: 97.4
Patient temperature: 97.8
Potassium: 3.7 mmol/L (ref 3.5–5.1)
Potassium: 4.1 mmol/L (ref 3.5–5.1)
Potassium: 3.9 mmol/L (ref 3.5–5.1)
Potassium: 3.8 mmol/L (ref 3.5–5.1)
Sodium: 142 mmol/L (ref 135–145)
Sodium: 144 mmol/L (ref 135–145)
Sodium: 144 mmol/L (ref 135–145)
Sodium: 143 mmol/L (ref 135–145)
TCO2: 25 mmol/L (ref 22–32)
TCO2: 18 mmol/L — ABNORMAL LOW (ref 22–32)
TCO2: 18 mmol/L — ABNORMAL LOW (ref 22–32)
TCO2: 22 mmol/L (ref 22–32)
pCO2 arterial: 43.4 mmHg (ref 32.0–48.0)
pCO2 arterial: 34.6 mmHg (ref 32.0–48.0)
pCO2 arterial: 34.1 mmHg (ref 32.0–48.0)
pCO2 arterial: 36.3 mmHg (ref 32.0–48.0)
pH, Arterial: 7.345 — ABNORMAL LOW (ref 7.350–7.450)
pH, Arterial: 7.304 — ABNORMAL LOW (ref 7.350–7.450)
pH, Arterial: 7.297 — ABNORMAL LOW (ref 7.350–7.450)
pH, Arterial: 7.369 (ref 7.350–7.450)
pO2, Arterial: 100 mmHg (ref 83.0–108.0)
pO2, Arterial: 119 mmHg — ABNORMAL HIGH (ref 83.0–108.0)
pO2, Arterial: 105 mmHg (ref 83.0–108.0)
pO2, Arterial: 85 mmHg (ref 83.0–108.0)

## 2019-06-19 LAB — BLOOD GAS, ARTERIAL
Acid-Base Excess: 2.4 mmol/L — ABNORMAL HIGH (ref 0.0–2.0)
Bicarbonate: 26.1 mmol/L (ref 20.0–28.0)
Drawn by: 34719
FIO2: 21
O2 Saturation: 98.5 %
Patient temperature: 36.8
pCO2 arterial: 37.6 mmHg (ref 32.0–48.0)
pH, Arterial: 7.454 — ABNORMAL HIGH (ref 7.350–7.450)
pO2, Arterial: 108 mmHg (ref 83.0–108.0)

## 2019-06-19 LAB — BASIC METABOLIC PANEL
Anion gap: 9 (ref 5–15)
Anion gap: 10 (ref 5–15)
BUN: 8 mg/dL (ref 6–20)
BUN: 12 mg/dL (ref 6–20)
CO2: 24 mmol/L (ref 22–32)
CO2: 19 mmol/L — ABNORMAL LOW (ref 22–32)
Calcium: 8.8 mg/dL — ABNORMAL LOW (ref 8.9–10.3)
Calcium: 7.4 mg/dL — ABNORMAL LOW (ref 8.9–10.3)
Chloride: 106 mmol/L (ref 98–111)
Chloride: 111 mmol/L (ref 98–111)
Creatinine, Ser: 0.44 mg/dL (ref 0.44–1.00)
Creatinine, Ser: 0.44 mg/dL (ref 0.44–1.00)
GFR calc Af Amer: >60 mL/min (ref >60)
GFR calc Af Amer: >60 mL/min (ref >60)
GFR calc non Af Amer: >60 mL/min (ref >60)
GFR calc non Af Amer: >60 mL/min (ref >60)
Glucose, Bld: 123 mg/dL — ABNORMAL HIGH (ref 70–99)
Glucose, Bld: 151 mg/dL — ABNORMAL HIGH (ref 70–99)
Potassium: 3.6 mmol/L (ref 3.5–5.1)
Potassium: 4.5 mmol/L (ref 3.5–5.1)
Sodium: 139 mmol/L (ref 135–145)
Sodium: 140 mmol/L (ref 135–145)

## 2019-06-19 LAB — GLUCOSE, CAPILLARY
Glucose-Capillary: 106 mg/dL — ABNORMAL HIGH (ref 70–99)
Glucose-Capillary: 136 mg/dL — ABNORMAL HIGH (ref 70–99)
Glucose-Capillary: 90 mg/dL (ref 70–99)
Glucose-Capillary: 123 mg/dL — ABNORMAL HIGH (ref 70–99)
Glucose-Capillary: 116 mg/dL — ABNORMAL HIGH (ref 70–99)
Glucose-Capillary: 162 mg/dL — ABNORMAL HIGH (ref 70–99)
Glucose-Capillary: 142 mg/dL — ABNORMAL HIGH (ref 70–99)
Glucose-Capillary: 135 mg/dL — ABNORMAL HIGH (ref 70–99)

## 2019-06-19 LAB — HEMOGLOBIN AND HEMATOCRIT, BLOOD
HCT: 23.8 % — ABNORMAL LOW (ref 36.0–46.0)
Hemoglobin: 8.2 g/dL — ABNORMAL LOW (ref 12.0–15.0)

## 2019-06-19 LAB — PREPARE RBC (CROSSMATCH)

## 2019-06-19 LAB — MAGNESIUM: Magnesium: 3 mg/dL — ABNORMAL HIGH (ref 1.7–2.4)

## 2019-06-19 LAB — PROTIME-INR
INR: 1.3 — ABNORMAL HIGH (ref 0.8–1.2)
Prothrombin Time: 16 seconds — ABNORMAL HIGH (ref 11.4–15.2)

## 2019-06-19 LAB — PLATELET COUNT: Platelets: 208 10*3/uL (ref 150–400)

## 2019-06-19 LAB — APTT
aPTT: 29 s (ref 24–36)
aPTT: 32 seconds (ref 24–36)

## 2019-06-19 SURGERY — CORONARY ARTERY BYPASS GRAFTING (CABG)
Anesthesia: General | Site: Chest

## 2019-06-19 MED ORDER — LACTATED RINGERS IV SOLN: INTRAVENOUS | Status: DC

## 2019-06-19 MED ORDER — SODIUM CHLORIDE 0.45 % IV SOLN: INTRAVENOUS | Status: DC | PRN

## 2019-06-19 MED ORDER — METOPROLOL TARTRATE 5 MG/5ML IV SOLN: 2.5000 mg | INTRAVENOUS | Status: DC | PRN

## 2019-06-19 MED ORDER — PROPOFOL 10 MG/ML IV BOLUS
INTRAVENOUS | Status: AC
  Filled 2019-06-19: qty 20

## 2019-06-19 MED ORDER — ASPIRIN EC 325 MG PO TBEC
325.0000 mg | DELAYED_RELEASE_TABLET | Freq: Every day | ORAL | Status: DC
  Administered 2019-06-20 – 2019-06-24 (×5): 325 mg via ORAL
  Filled 2019-06-19 (×5): qty 1

## 2019-06-19 MED ORDER — ROCURONIUM BROMIDE 10 MG/ML (PF) SYRINGE
PREFILLED_SYRINGE | INTRAVENOUS | Status: AC
  Filled 2019-06-19: qty 10

## 2019-06-19 MED ORDER — EPHEDRINE 5 MG/ML INJ
INTRAVENOUS | Status: AC
  Filled 2019-06-19: qty 20

## 2019-06-19 MED ORDER — LACTATED RINGERS IV SOLN: 500.0000 mL | Freq: Once | INTRAVENOUS | Status: DC | PRN

## 2019-06-19 MED ORDER — HEPARIN SODIUM (PORCINE) 1000 UNIT/ML IJ SOLN
INTRAMUSCULAR | Status: AC
  Filled 2019-06-19: qty 1

## 2019-06-19 MED ORDER — LIDOCAINE 2% (20 MG/ML) 5 ML SYRINGE
INTRAMUSCULAR | Status: DC | PRN
  Administered 2019-06-19: 80 mg via INTRAVENOUS

## 2019-06-19 MED ORDER — SODIUM CHLORIDE 0.9% IV SOLUTION: Freq: Once | INTRAVENOUS | Status: DC

## 2019-06-19 MED ORDER — SODIUM CHLORIDE 0.9 % IV SOLN: 250.0000 mL | INTRAVENOUS | Status: DC

## 2019-06-19 MED ORDER — CHLORHEXIDINE GLUCONATE CLOTH 2 % EX PADS
6.0000 | MEDICATED_PAD | Freq: Every day | CUTANEOUS | Status: DC
  Administered 2019-06-19 – 2019-06-21 (×3): 6 via TOPICAL

## 2019-06-19 MED ORDER — SODIUM CHLORIDE (PF) 0.9 % IJ SOLN
OROMUCOSAL | Status: DC | PRN
  Administered 2019-06-19 (×3): 4 mL via TOPICAL

## 2019-06-19 MED ORDER — PROTAMINE SULFATE 10 MG/ML IV SOLN
INTRAVENOUS | Status: DC | PRN
  Administered 2019-06-19: 50 mg via INTRAVENOUS
  Administered 2019-06-19: 10 mg via INTRAVENOUS
  Administered 2019-06-19: 50 mg via INTRAVENOUS
  Administered 2019-06-19: 30 mg via INTRAVENOUS
  Administered 2019-06-19 (×2): 50 mg via INTRAVENOUS
  Administered 2019-06-19: 40 mg via INTRAVENOUS

## 2019-06-19 MED ORDER — ACETAMINOPHEN 160 MG/5ML PO SOLN
650.0000 mg | Freq: Once | ORAL | Status: AC
  Administered 2019-06-19: 17:00:00 650 mg
  Filled 2019-06-19: qty 20.3

## 2019-06-19 MED ORDER — METOPROLOL TARTRATE 25 MG/10 ML ORAL SUSPENSION: 12.5000 mg | Freq: Two times a day (BID) | ORAL | Status: DC

## 2019-06-19 MED ORDER — ALBUMIN HUMAN 5 % IV SOLN
250.0000 mL | INTRAVENOUS | Status: AC | PRN
  Administered 2019-06-19 (×2): 12.5 g via INTRAVENOUS

## 2019-06-19 MED ORDER — ACETAMINOPHEN 160 MG/5ML PO SOLN: 1000.0000 mg | Freq: Four times a day (QID) | ORAL | Status: DC

## 2019-06-19 MED ORDER — LACTATED RINGERS IV SOLN: INTRAVENOUS | Status: DC | PRN

## 2019-06-19 MED ORDER — ROCURONIUM BROMIDE 10 MG/ML (PF) SYRINGE
PREFILLED_SYRINGE | INTRAVENOUS | Status: AC
  Filled 2019-06-19: qty 20

## 2019-06-19 MED ORDER — ROCURONIUM BROMIDE 10 MG/ML (PF) SYRINGE
PREFILLED_SYRINGE | INTRAVENOUS | Status: DC | PRN
  Administered 2019-06-19 (×2): 50 mg via INTRAVENOUS
  Administered 2019-06-19: 100 mg via INTRAVENOUS
  Administered 2019-06-19 (×2): 50 mg via INTRAVENOUS

## 2019-06-19 MED ORDER — VANCOMYCIN HCL IN DEXTROSE 1-5 GM/200ML-% IV SOLN
1000.0000 mg | Freq: Once | INTRAVENOUS | Status: AC
  Administered 2019-06-19: 19:00:00 1000 mg via INTRAVENOUS
  Filled 2019-06-19: qty 200

## 2019-06-19 MED ORDER — PROPOFOL 10 MG/ML IV BOLUS
INTRAVENOUS | Status: DC | PRN
  Administered 2019-06-19: 80 mg via INTRAVENOUS

## 2019-06-19 MED ORDER — MIDAZOLAM HCL 2 MG/2ML IJ SOLN: 2.0000 mg | INTRAMUSCULAR | Status: DC | PRN

## 2019-06-19 MED ORDER — FENTANYL CITRATE (PF) 250 MCG/5ML IJ SOLN
INTRAMUSCULAR | Status: DC | PRN
  Administered 2019-06-19: 50 ug via INTRAVENOUS
  Administered 2019-06-19: 250 ug via INTRAVENOUS
  Administered 2019-06-19: 50 ug via INTRAVENOUS
  Administered 2019-06-19: 350 ug via INTRAVENOUS
  Administered 2019-06-19: 50 ug via INTRAVENOUS
  Administered 2019-06-19: 250 ug via INTRAVENOUS
  Administered 2019-06-19: 100 ug via INTRAVENOUS
  Administered 2019-06-19: 150 ug via INTRAVENOUS

## 2019-06-19 MED ORDER — PANTOPRAZOLE SODIUM 40 MG PO TBEC
40.0000 mg | DELAYED_RELEASE_TABLET | Freq: Every day | ORAL | Status: DC
  Administered 2019-06-21 – 2019-06-23 (×3): 40 mg via ORAL
  Filled 2019-06-19 (×4): qty 1

## 2019-06-19 MED ORDER — SODIUM CHLORIDE 0.9% FLUSH: 3.0000 mL | INTRAVENOUS | Status: DC | PRN

## 2019-06-19 MED ORDER — LIDOCAINE 2% (20 MG/ML) 5 ML SYRINGE
INTRAMUSCULAR | Status: AC
  Filled 2019-06-19: qty 5

## 2019-06-19 MED ORDER — TRAMADOL HCL 50 MG PO TABS
50.0000 mg | ORAL_TABLET | ORAL | Status: DC | PRN
  Administered 2019-06-19 – 2019-06-20 (×2): 100 mg via ORAL
  Administered 2019-06-20: 50 mg via ORAL
  Administered 2019-06-20 – 2019-06-22 (×4): 100 mg via ORAL
  Filled 2019-06-19: qty 1
  Filled 2019-06-19 (×6): qty 2

## 2019-06-19 MED ORDER — FENTANYL CITRATE (PF) 250 MCG/5ML IJ SOLN
INTRAMUSCULAR | Status: AC
  Filled 2019-06-19: qty 25

## 2019-06-19 MED ORDER — EPHEDRINE SULFATE-NACL 50-0.9 MG/10ML-% IV SOSY
PREFILLED_SYRINGE | INTRAVENOUS | Status: DC | PRN
  Administered 2019-06-19: 5 mg via INTRAVENOUS

## 2019-06-19 MED ORDER — DEXTROSE 50 % IV SOLN: 0.0000 mL | INTRAVENOUS | Status: DC | PRN

## 2019-06-19 MED ORDER — ONDANSETRON HCL 4 MG/2ML IJ SOLN
4.0000 mg | Freq: Four times a day (QID) | INTRAMUSCULAR | Status: DC | PRN
  Administered 2019-06-22: 4 mg via INTRAVENOUS
  Filled 2019-06-19: qty 2

## 2019-06-19 MED ORDER — OXYCODONE HCL 5 MG PO TABS
5.0000 mg | ORAL_TABLET | ORAL | Status: DC | PRN
  Administered 2019-06-20 (×6): 5 mg via ORAL
  Administered 2019-06-21: 10 mg via ORAL
  Administered 2019-06-21: 5 mg via ORAL
  Administered 2019-06-22 – 2019-06-23 (×2): 10 mg via ORAL
  Filled 2019-06-19 (×3): qty 1
  Filled 2019-06-19: qty 2
  Filled 2019-06-19 (×4): qty 1
  Filled 2019-06-19 (×2): qty 2

## 2019-06-19 MED ORDER — ACETAMINOPHEN 500 MG PO TABS
1000.0000 mg | ORAL_TABLET | Freq: Four times a day (QID) | ORAL | Status: DC
  Administered 2019-06-20 – 2019-06-24 (×15): 1000 mg via ORAL
  Filled 2019-06-19 (×14): qty 2

## 2019-06-19 MED ORDER — METOPROLOL TARTRATE 12.5 MG HALF TABLET: 12.5000 mg | ORAL_TABLET | Freq: Two times a day (BID) | ORAL | Status: DC

## 2019-06-19 MED ORDER — PHENYLEPHRINE 40 MCG/ML (10ML) SYRINGE FOR IV PUSH (FOR BLOOD PRESSURE SUPPORT)
PREFILLED_SYRINGE | INTRAVENOUS | Status: DC | PRN
  Administered 2019-06-19: 80 ug via INTRAVENOUS

## 2019-06-19 MED ORDER — SODIUM CHLORIDE 0.9 % IV SOLN: INTRAVENOUS | Status: DC | PRN

## 2019-06-19 MED ORDER — MAGNESIUM SULFATE 4 GM/100ML IV SOLN
4.0000 g | Freq: Once | INTRAVENOUS | Status: AC
  Administered 2019-06-19: 14:00:00 4 g via INTRAVENOUS
  Filled 2019-06-19: qty 100

## 2019-06-19 MED ORDER — CHLORHEXIDINE GLUCONATE 0.12 % MT SOLN
15.0000 mL | OROMUCOSAL | Status: AC
  Administered 2019-06-19: 17:00:00 15 mL via OROMUCOSAL

## 2019-06-19 MED ORDER — SODIUM BICARBONATE 8.4 % IV SOLN
50.0000 meq | Freq: Once | INTRAVENOUS | Status: AC
  Administered 2019-06-19: 20:00:00 50 meq via INTRAVENOUS
  Filled 2019-06-19: qty 50

## 2019-06-19 MED ORDER — POTASSIUM CHLORIDE 10 MEQ/50ML IV SOLN
10.0000 meq | INTRAVENOUS | Status: AC
  Administered 2019-06-19: 15:00:00 10 meq via INTRAVENOUS

## 2019-06-19 MED ORDER — SODIUM CHLORIDE 0.9 % IV SOLN: INTRAVENOUS | Status: DC

## 2019-06-19 MED ORDER — SODIUM CHLORIDE 0.9 % IV SOLN
INTRAVENOUS | Status: DC | PRN
  Administered 2019-06-19: 750 mg via INTRAVENOUS

## 2019-06-19 MED ORDER — HEPARIN SODIUM (PORCINE) 1000 UNIT/ML IJ SOLN
INTRAMUSCULAR | Status: DC | PRN
  Administered 2019-06-19: 36000 [IU] via INTRAVENOUS
  Administered 2019-06-19: 2000 [IU] via INTRAVENOUS

## 2019-06-19 MED ORDER — FAMOTIDINE IN NACL 20-0.9 MG/50ML-% IV SOLN
20.0000 mg | Freq: Two times a day (BID) | INTRAVENOUS | Status: AC
  Filled 2019-06-19: qty 50

## 2019-06-19 MED ORDER — ACETAMINOPHEN 650 MG RE SUPP: 650.0000 mg | Freq: Once | RECTAL | Status: AC

## 2019-06-19 MED ORDER — BISACODYL 10 MG RE SUPP: 10.0000 mg | Freq: Every day | RECTAL | Status: DC

## 2019-06-19 MED ORDER — PROTAMINE SULFATE 10 MG/ML IV SOLN
INTRAVENOUS | Status: AC
  Filled 2019-06-19: qty 50

## 2019-06-19 MED ORDER — PHENYLEPHRINE 40 MCG/ML (10ML) SYRINGE FOR IV PUSH (FOR BLOOD PRESSURE SUPPORT)
PREFILLED_SYRINGE | INTRAVENOUS | Status: AC
  Filled 2019-06-19: qty 10

## 2019-06-19 MED ORDER — ASPIRIN 81 MG PO CHEW: 324.0000 mg | CHEWABLE_TABLET | Freq: Every day | ORAL | Status: DC

## 2019-06-19 MED ORDER — DOCUSATE SODIUM 100 MG PO CAPS
200.0000 mg | ORAL_CAPSULE | Freq: Every day | ORAL | Status: DC
  Administered 2019-06-20: 200 mg via ORAL
  Filled 2019-06-19: qty 2

## 2019-06-19 MED ORDER — INSULIN REGULAR(HUMAN) IN NACL 100-0.9 UT/100ML-% IV SOLN: INTRAVENOUS | Status: DC

## 2019-06-19 MED ORDER — 0.9 % SODIUM CHLORIDE (POUR BTL) OPTIME
TOPICAL | Status: DC | PRN
  Administered 2019-06-19: 5000 mL

## 2019-06-19 MED ORDER — SODIUM CHLORIDE 0.9% FLUSH
3.0000 mL | Freq: Two times a day (BID) | INTRAVENOUS | Status: DC
  Administered 2019-06-20 – 2019-06-23 (×6): 3 mL via INTRAVENOUS

## 2019-06-19 MED ORDER — BISACODYL 5 MG PO TBEC
10.0000 mg | DELAYED_RELEASE_TABLET | Freq: Every day | ORAL | Status: DC
  Administered 2019-06-20: 10 mg via ORAL
  Filled 2019-06-19: qty 2

## 2019-06-19 MED ORDER — MIDAZOLAM HCL (PF) 10 MG/2ML IJ SOLN
INTRAMUSCULAR | Status: AC
  Filled 2019-06-19: qty 2

## 2019-06-19 MED ORDER — ONDANSETRON HCL 4 MG/2ML IJ SOLN
INTRAMUSCULAR | Status: AC
  Filled 2019-06-19: qty 2

## 2019-06-19 MED ORDER — NITROGLYCERIN IN D5W 200-5 MCG/ML-% IV SOLN: 0.0000 ug/min | INTRAVENOUS | Status: DC

## 2019-06-19 MED ORDER — PHENYLEPHRINE HCL-NACL 20-0.9 MG/250ML-% IV SOLN: 0.0000 ug/min | INTRAVENOUS | Status: DC

## 2019-06-19 MED ORDER — MORPHINE SULFATE (PF) 2 MG/ML IV SOLN
1.0000 mg | INTRAVENOUS | Status: DC | PRN
  Administered 2019-06-19 – 2019-06-20 (×6): 2 mg via INTRAVENOUS
  Filled 2019-06-19 (×2): qty 1
  Filled 2019-06-19: qty 2
  Filled 2019-06-19 (×2): qty 1

## 2019-06-19 MED ORDER — HEMOSTATIC AGENTS (NO CHARGE) OPTIME
TOPICAL | Status: DC | PRN
  Administered 2019-06-19 (×2): 1 via TOPICAL

## 2019-06-19 MED ORDER — SODIUM CHLORIDE 0.9 % IV SOLN
1.5000 g | Freq: Two times a day (BID) | INTRAVENOUS | Status: AC
  Administered 2019-06-19 – 2019-06-21 (×4): 1.5 g via INTRAVENOUS
  Filled 2019-06-19 (×4): qty 1.5

## 2019-06-19 MED ORDER — DEXMEDETOMIDINE HCL IN NACL 400 MCG/100ML IV SOLN
0.0000 ug/kg/h | INTRAVENOUS | Status: DC
  Administered 2019-06-19: 14:00:00 0.5 ug/kg/h via INTRAVENOUS
  Filled 2019-06-19: qty 100

## 2019-06-19 MED ORDER — MIDAZOLAM HCL 5 MG/5ML IJ SOLN
INTRAMUSCULAR | Status: DC | PRN
  Administered 2019-06-19: 3 mg via INTRAVENOUS
  Administered 2019-06-19: 2 mg via INTRAVENOUS
  Administered 2019-06-19: 3 mg via INTRAVENOUS
  Administered 2019-06-19: 2 mg via INTRAVENOUS

## 2019-06-19 MED FILL — Verapamil HCl IV Soln 2.5 MG/ML: INTRAVENOUS | Qty: 2 | Status: AC

## 2019-06-19 MED FILL — Heparin Sodium (Porcine) Inj 1000 Unit/ML: INTRAMUSCULAR | Qty: 30 | Status: AC

## 2019-06-19 MED FILL — Potassium Chloride Inj 2 mEq/ML: INTRAVENOUS | Qty: 40 | Status: AC

## 2019-06-19 MED FILL — Magnesium Sulfate Inj 50%: INTRAMUSCULAR | Qty: 10 | Status: AC

## 2019-06-19 SURGICAL SUPPLY — 115 items
ADH SKN CLS APL DERMABOND .7 (GAUZE/BANDAGES/DRESSINGS) ×3
APL SKNCLS STERI-STRIP NONHPOA (GAUZE/BANDAGES/DRESSINGS) ×6
APPLIER CLIP 9.375 SM OPEN (CLIP)
APR CLP SM 9.3 20 MLT OPN (CLIP)
BAG DECANTER FOR FLEXI CONT (MISCELLANEOUS) ×5 IMPLANT
BASKET HEART  (ORDER IN 25'S) (MISCELLANEOUS) ×1
BASKET HEART (ORDER IN 25'S) (MISCELLANEOUS) ×4
BASKET HEART (ORDER IN 25S) (MISCELLANEOUS) ×3 IMPLANT
BENZOIN TINCTURE PRP APPL 2/3 (GAUZE/BANDAGES/DRESSINGS) ×4 IMPLANT
BLADE CLIPPER SURG (BLADE) ×5 IMPLANT
BLADE STERNUM SYSTEM 6 (BLADE) ×5 IMPLANT
BLADE SURG 15 STRL LF DISP TIS (BLADE) IMPLANT
BLADE SURG 15 STRL SS (BLADE)
BNDG ELASTIC 4X5.8 VLCR STR LF (GAUZE/BANDAGES/DRESSINGS) ×5 IMPLANT
BNDG ELASTIC 6X5.8 VLCR STR LF (GAUZE/BANDAGES/DRESSINGS) ×5 IMPLANT
BNDG GAUZE ELAST 4 BULKY (GAUZE/BANDAGES/DRESSINGS) ×5 IMPLANT
CANISTER SUCT 3000ML PPV (MISCELLANEOUS) ×5 IMPLANT
CANNULA ARTERIAL NVNT 3/8 20FR (MISCELLANEOUS) ×2 IMPLANT
CANNULA EZ GLIDE AORTIC 21FR (CANNULA) ×5 IMPLANT
CATH CPB KIT HENDRICKSON (MISCELLANEOUS) ×5 IMPLANT
CATH ROBINSON RED A/P 18FR (CATHETERS) ×5 IMPLANT
CATH THORACIC 36FR (CATHETERS) ×5 IMPLANT
CATH THORACIC 36FR RT ANG (CATHETERS) ×5 IMPLANT
CLIP APPLIE 9.375 SM OPEN (CLIP) IMPLANT
CLIP VESOCCLUDE MED 24/CT (CLIP) ×2 IMPLANT
CLIP VESOCCLUDE SM WIDE 24/CT (CLIP) ×10 IMPLANT
COVER MAYO STAND STRL (DRAPES) ×2 IMPLANT
CUFF TOURN SGL QUICK 18X4 (TOURNIQUET CUFF) IMPLANT
CUFF TOURN SGL QUICK 24 (TOURNIQUET CUFF)
CUFF TRNQT CYL 24X4X16.5-23 (TOURNIQUET CUFF) IMPLANT
DERMABOND ADVANCED (GAUZE/BANDAGES/DRESSINGS) ×2
DERMABOND ADVANCED .7 DNX12 (GAUZE/BANDAGES/DRESSINGS) IMPLANT
DRAPE CARDIOVASCULAR INCISE (DRAPES) ×5
DRAPE EXTREMITY T 121X128X90 (DISPOSABLE) ×2 IMPLANT
DRAPE HALF SHEET 40X57 (DRAPES) ×2 IMPLANT
DRAPE SLUSH/WARMER DISC (DRAPES) ×5 IMPLANT
DRAPE SRG 135X102X78XABS (DRAPES) ×3 IMPLANT
DRSG COVADERM 4X14 (GAUZE/BANDAGES/DRESSINGS) ×3 IMPLANT
DRSG TEGADERM 4X4.75 (GAUZE/BANDAGES/DRESSINGS) ×2 IMPLANT
ELECT BLADE 4.0 EZ CLEAN MEGAD (MISCELLANEOUS) ×5
ELECT REM PT RETURN 9FT ADLT (ELECTROSURGICAL) ×10
ELECTRODE BLDE 4.0 EZ CLN MEGD (MISCELLANEOUS) IMPLANT
ELECTRODE REM PT RTRN 9FT ADLT (ELECTROSURGICAL) ×6 IMPLANT
FELT TEFLON 1X6 (MISCELLANEOUS) ×8 IMPLANT
GAUZE SPONGE 4X4 12PLY STRL (GAUZE/BANDAGES/DRESSINGS) ×12 IMPLANT
GEL ULTRASOUND 20GR AQUASONIC (MISCELLANEOUS) IMPLANT
GLOVE BIO SURGEON STRL SZ 6 (GLOVE) ×6 IMPLANT
GLOVE BIO SURGEON STRL SZ 6.5 (GLOVE) ×5 IMPLANT
GLOVE BIO SURGEON STRL SZ7.5 (GLOVE) ×4 IMPLANT
GLOVE BIO SURGEONS STRL SZ 6.5 (GLOVE) ×5
GLOVE SURG SIGNA 7.5 PF LTX (GLOVE) ×15 IMPLANT
GOWN STRL REUS W/ TWL LRG LVL3 (GOWN DISPOSABLE) ×12 IMPLANT
GOWN STRL REUS W/ TWL XL LVL3 (GOWN DISPOSABLE) ×6 IMPLANT
GOWN STRL REUS W/TWL LRG LVL3 (GOWN DISPOSABLE) ×55
GOWN STRL REUS W/TWL XL LVL3 (GOWN DISPOSABLE) ×10
GRASPER SUT TROCAR 14GX15 (MISCELLANEOUS) ×2 IMPLANT
HEMOSTAT POWDER SURGIFOAM 1G (HEMOSTASIS) ×15 IMPLANT
HEMOSTAT SURGICEL 2X14 (HEMOSTASIS) ×5 IMPLANT
INSERT FOGARTY XLG (MISCELLANEOUS) IMPLANT
KIT BASIN OR (CUSTOM PROCEDURE TRAY) ×5 IMPLANT
KIT DRSG PREVENA PLUS 7DAY 125 (MISCELLANEOUS) ×2 IMPLANT
KIT SUCTION CATH 14FR (SUCTIONS) ×10 IMPLANT
KIT TURNOVER KIT B (KITS) ×5 IMPLANT
KIT VASOVIEW HEMOPRO 2 VH 4000 (KITS) ×5 IMPLANT
MARKER GRAFT CORONARY BYPASS (MISCELLANEOUS) ×13 IMPLANT
NS IRRIG 1000ML POUR BTL (IV SOLUTION) ×25 IMPLANT
PACK E OPEN HEART (SUTURE) ×5 IMPLANT
PACK OPEN HEART (CUSTOM PROCEDURE TRAY) ×5 IMPLANT
PAD ARMBOARD 7.5X6 YLW CONV (MISCELLANEOUS) ×10 IMPLANT
PAD ELECT DEFIB RADIOL ZOLL (MISCELLANEOUS) ×5 IMPLANT
PENCIL BUTTON HOLSTER BLD 10FT (ELECTRODE) ×5 IMPLANT
POSITIONER HEAD DONUT 9IN (MISCELLANEOUS) ×5 IMPLANT
PREVENA RESTOR AXIOFORM 29X28 (GAUZE/BANDAGES/DRESSINGS) ×2 IMPLANT
PUNCH AORTIC ROT 4.0MM RCL 40 (MISCELLANEOUS) ×2 IMPLANT
PUNCH AORTIC ROTATE 4.0MM (MISCELLANEOUS) IMPLANT
PUNCH AORTIC ROTATE 4.5MM 8IN (MISCELLANEOUS) IMPLANT
PUNCH AORTIC ROTATE 5MM 8IN (MISCELLANEOUS) IMPLANT
SET CARDIOPLEGIA MPS 5001102 (MISCELLANEOUS) ×2 IMPLANT
SHEARS HARMONIC 9CM CVD (BLADE) ×5 IMPLANT
SHEARS HARMONIC STRL 23CM (MISCELLANEOUS) IMPLANT
SPONGE LAP 18X18 RF (DISPOSABLE) ×4 IMPLANT
SPONGE LAP 4X18 RFD (DISPOSABLE) ×2 IMPLANT
SUT BONE WAX W31G (SUTURE) ×5 IMPLANT
SUT MNCRL AB 4-0 PS2 18 (SUTURE) ×2 IMPLANT
SUT PROLENE 3 0 SH DA (SUTURE) ×5 IMPLANT
SUT PROLENE 4 0 RB 1 (SUTURE) ×5
SUT PROLENE 4 0 SH DA (SUTURE) IMPLANT
SUT PROLENE 4-0 RB1 .5 CRCL 36 (SUTURE) IMPLANT
SUT PROLENE 6 0 C 1 30 (SUTURE) ×10 IMPLANT
SUT PROLENE 7 0 BV 1 (SUTURE) ×2 IMPLANT
SUT PROLENE 7 0 BV1 MDA (SUTURE) ×7 IMPLANT
SUT PROLENE 8 0 BV175 6 (SUTURE) ×2 IMPLANT
SUT SILK  1 MH (SUTURE) ×5
SUT SILK 1 MH (SUTURE) IMPLANT
SUT STEEL 6MS V (SUTURE) ×5 IMPLANT
SUT STEEL STERNAL CCS#1 18IN (SUTURE) IMPLANT
SUT STEEL SZ 6 DBL 3X14 BALL (SUTURE) ×5 IMPLANT
SUT VIC AB 1 CTX 36 (SUTURE) ×10
SUT VIC AB 1 CTX36XBRD ANBCTR (SUTURE) ×6 IMPLANT
SUT VIC AB 2-0 CT1 27 (SUTURE) ×10
SUT VIC AB 2-0 CT1 TAPERPNT 27 (SUTURE) IMPLANT
SUT VIC AB 2-0 CTX 27 (SUTURE) IMPLANT
SUT VIC AB 3-0 SH 27 (SUTURE)
SUT VIC AB 3-0 SH 27X BRD (SUTURE) IMPLANT
SUT VIC AB 3-0 X1 27 (SUTURE) ×4 IMPLANT
SUT VICRYL 4-0 PS2 18IN ABS (SUTURE) IMPLANT
SYR 50ML SLIP (SYRINGE) IMPLANT
SYSTEM SAHARA CHEST DRAIN ATS (WOUND CARE) ×5 IMPLANT
TAPE CLOTH SURG 4X10 WHT LF (GAUZE/BANDAGES/DRESSINGS) ×2 IMPLANT
TOWEL GREEN STERILE (TOWEL DISPOSABLE) ×5 IMPLANT
TOWEL GREEN STERILE FF (TOWEL DISPOSABLE) ×5 IMPLANT
TRAY FOLEY SLVR 16FR TEMP STAT (SET/KITS/TRAYS/PACK) ×5 IMPLANT
TUBING LAP HI FLOW INSUFFLATIO (TUBING) ×5 IMPLANT
UNDERPAD 30X30 (UNDERPADS AND DIAPERS) ×5 IMPLANT
WATER STERILE IRR 1000ML POUR (IV SOLUTION) ×10 IMPLANT

## 2019-06-19 NOTE — Procedures (Signed)
Extubation Procedure Note  Patient Details:   Name: Beverly [name] DOB: Nov 27, 1975 MRN: [id number]   Airway Documentation:    Vent end date: 06/19/19 Vent end time: 1749   Evaluation  O2 sats: stable throughout Complications: No apparent complications Patient did tolerate procedure well. Bilateral Breath Sounds: Diminished   Yes  Pt was extubated per rapid wean protocol and placed on 4 L [place]. Vc was and NIF -25 prior to extubation. No stridor was noted post extubation. Pt was given incentive spirometer. Pt is stable at this time. RT will continue to monitor.   Merlene Laughter 06/19/2019, 5:49 PM

## 2019-06-19 NOTE — Op Note (Signed)
NAME: DESTYN, PARFITT MEDICAL RECORD OI:[id number] ACCOUNT [id number] DATE OF BIRTH:27-May-1975 FACILITY: [facility] LOCATION: [facility] PHYSICIAN:Proctor Carriker Lars Pinks, MD  OPERATIVE REPORT  DATE OF PROCEDURE:  06/19/2019  PREOPERATIVE DIAGNOSES:  Three-vessel coronary disease with unstable angina.  POSTOPERATIVE DIAGNOSIS:  Three-vessel coronary disease with unstable angina.  PROCEDURES:   1.  Median sternotomy.  2.  Extracorporeal circulation.   3.  Coronary artery bypass grafting x 3  Left internal mammary artery to left anterior descending,  Saphenous vein graft to ramus intermedius,  Saphenous vein graft to posterior descending. 4.  Endoscopic vein harvest left thigh.  SURGEON:  Charlett Lango, MD  ASSISTANT:  Gershon Crane, PA-C  SECOND ASSISTANT:  Lowella Dandy, PA-C  ANESTHESIA:  General.  FINDINGS:  Radial artery unusable.  Mammary artery and saphenous vein good quality.  Posterior descending fair target.  LAD and OM1 good quality targets.  Overall left ventricular function EF of approximately 50% with some left ventricular hypertrophy,  no significant valvular pathology.  CLINICAL NOTE:  Mrs. Demello is a 44 year old woman with history of known coronary disease.  She has had previous percutaneous interventions.  She presented with unstable chest pain, but ruled out for myocardial infarction.  At catheterization, she was  found to have severe 3-vessel coronary disease and was referred for coronary artery bypass grafting.  The indications, risks, benefits, and alternatives were discussed in detail with the patient.  She understood and accepted the risks and agreed to  proceed.  DESCRIPTION OF PROCEDURE: Mrs. Blackson was brought to the preoperative holding area on 06/19/2019.  Anesthesia placed a central venous catheter and an arterial blood pressure monitoring line.  Allen's test was performed and there was no change in the pulse  ox waveform with left radial compression.  She  was taken to the operating room, anesthetized and intubated.  Intravenous antibiotics were administered.  A Foley catheter was placed.  The chest, abdomen, legs and left arm were prepped and draped in the usual  sterile fashion.  A timeout was performed.  An incision was made over the volar aspect of the left wrist.  It was carried through the skin and subcutaneous tissue.  A short segment of the radial artery was dissected out.  The radial was small in caliber and calcified with  a poor pulse.  It was not suitable for use as a bypass graft.  The incision was closed in 2 layers.  Simultaneously with the radial harvest, an incision was made in the medial aspect of the left leg just below the knee.  The greater saphenous vein was  then harvested from there to the groin endoscopically.  The saphenous vein was of good quality.  A median sternotomy was performed and the left internal mammary artery was harvested in standard fashion.  This was difficult due to the patient's body  habitus, but the mammary artery was a good quality vessel with excellent flow when divided distally.  Two thousand units of heparin was administered during the vessel harvest.  The remainder of a full heparin dose was given prior to opening the  pericardium.  After harvesting the conduits, the pericardium was opened.  The ascending aorta was inspected.  It was of normal caliber with no significant atherosclerotic disease.  The anticoagulation was monitored with ACT measurement.  When the ACT was  satisfactory, the aorta was cannulated via concentric 2-0 Ethibond pledgeted pursestring sutures.  A dual stage venous cannula was placed via a pursestring suture in the right atrial appendage.  Cardiopulmonary bypass was initiated.  Flows were maintained per  protocol.  The patient was cooled to 32 degrees Celsius.  The coronary arteries were inspected and anastomotic sites were chosen.  The conduits were inspected and cut to length.  A foam  pad was placed in the pericardium to insulate the heart and protect  the left phrenic nerve.  A temperature probe was placed in the myocardial septum and a cardioplegia cannula was placed in the ascending aorta.  The aorta was crossclamped.  The left ventricle was emptied via the aortic root vent.  Cardiac arrest then was achieved with a combination of cold antegrade blood cardioplegia and topical iced saline; 1.5 liters of cardioplegia was administered.  There  was a rapid diastolic arrest but septal cooling was relatively slow, but ultimately cooled to 9 degrees Celsius.  The following distal anastomoses were performed.  A reversed saphenous vein graft was placed end-to-side to the posterior  descending.  The vein was of good quality.  The posterior descending was a 1.5 mm fair quality vessel.  It did have diffuse disease.  The end-to-side anastomosis was performed with a  running 7-0 Prolene suture.  At the completion of the anastomosis, a probe passed easily proximally and distally.  Cardioplegia was administered down the graft and there was good flow and good hemostasis.  Next, a reversed saphenous vein graft was placed end-to-side to the ramus intermedius.  This was the dominant lateral branch.  It was heavily calcified proximally.  At the site of anastomosis, there was calcification at the proximal portion of the  anastomosis, but the vessel was of good quality at the distal portion.  It was superficially intramyocardial in that area.  The end-to-side anastomosis was performed with a running 7-0 Prolene suture.  Again, a probe passed easily proximally and  distally.  Cardioplegia was administered down the graft and there was good flow and good hemostasis.  Additional cardioplegia was administered down the aortic root.  The left internal mammary artery was brought through a window in the pericardium.  The distal end was bevelled.  This was a 2 mm good quality conduit.  It was anastomosed  end-to-side to the  distal LAD, which was a 1.5 mm good quality target at the site of anastomosis.  There was heavy calcification proximal to that.  The anastomosis was performed with a running 8-0 Prolene suture.  At completion of the anastomosis, the bulldog clamp was  briefly removed.  Rapid septal rewarming was noted.  The bulldog clamps was replaced and the mammary pedicle was tacked to the epicardial surface of the heart with 6-0 Prolene sutures.  Additional cardioplegia was administered.  The vein grafts were cut to length.  The cardioplegia cannula was removed from the ascending aorta.  The proximal vein graft anastomoses were performed to 4.0 mm punch aortotomies with running 6-0 Prolene  sutures.  At completion of the final proximal anastomosis, the patient was placed in Trendelenburg position.  Lidocaine was administered.  The aortic root was deaired.  The bulldog clamps were removed from the left mammary artery.  The aortic crossclamp  was removed.  The total crossclamp time was 64 minutes.  The patient required a single defibrillation with 10 joules and then was in sinus rhythm thereafter.  While rewarming was completed, all proximal and distal anastomoses were inspected for hemostasis.  Epicardial pacing wires were placed on the right ventricle and right atrium.  When the core temperature reached 37 degrees Celsius, she was  weaned from  cardiopulmonary bypass on the first attempt.  Total bypass time was 89 minutes.  She did not require inotropic support.  She was in sinus rhythm at time of separation from bypass.  The initial cardiac index was greater than 2 L per minute per meter  squared and the patient remained hemodynamically stable throughout the post-bypass period.  Post-bypass transesophageal echocardiography showed some improvement in the lateral wall motion.  The overall ejection fraction was estimated to be about 50%.   There was no significant valvular pathology.  A test dose of  protamine was administered and was well tolerated.  The atrial and aortic cannulae were removed.  The remainder of the protamine was administered without incident.  The chest was irrigated with warm saline.  Hemostasis was achieved.  Left  pleural and mediastinal chest tubes were placed through separate subcostal incisions and secured with #1 silk sutures.  The pericardium was reapproximated over the ascending aorta with interrupted 3-0 silk sutures.  The sternum was closed with a  combination of single and double heavy gauge stainless steel wires.  Pectoralis fascia, subcutaneous tissue and skin were closed in standard fashion.  All sponge, needle and instrument counts were correct at the end of the procedure.  The patient was  taken from the operating room to the surgical intensive care unit, intubated and in good condition.  VN/NUANCE  D:06/19/2019 T:06/19/2019 JOB:[id number]

## 2019-06-19 NOTE — Anesthesia Procedure Notes (Signed)
Central Venous Catheter Insertion Performed by: Cecile Hearing, MD, anesthesiologist Start/End3/25/2021 6:45 AM, 06/19/2019 6:55 AM Patient location: Pre-op. Preanesthetic checklist: patient identified, IV checked, site marked, risks and benefits discussed, surgical consent, monitors and equipment checked, pre-op evaluation, timeout performed and anesthesia consent Position: Trendelenburg Lidocaine 1% used for infiltration and patient sedated Hand hygiene performed , maximum sterile barriers used  and Seldinger technique used Catheter size: 8.5 Fr Central line was placed.Sheath introducer Procedure performed using ultrasound guided technique. Ultrasound Notes:anatomy identified, needle tip was noted to be adjacent to the nerve/plexus identified, no ultrasound evidence of intravascular and/or intraneural injection and image(s) printed for medical record Attempts: 1 Following insertion, line sutured, dressing applied and Biopatch. Post procedure assessment: free fluid flow, blood return through all ports and no air  Patient tolerated the procedure well with no immediate complications.

## 2019-06-19 NOTE — Progress Notes (Signed)
CT surgery p.m. Rounds  Young obese female status post multivessel CABG Extubated with stable hemodynamics Responsive Minimal chest tube output Postop hemoglobin 9.9 Postextubation gas slightly acidotic-we will repeat in 2 hours

## 2019-06-19 NOTE — Interval H&P Note (Signed)
History and Physical Interval Note: No change in pulse ox tracing with left radial occlusion- will plan to evaluate intraop  06/19/2019 7:57 AM  Beverly [name]  has presented today for surgery, with the diagnosis of CAD.  The various methods of treatment have been discussed with the patient and family. After consideration of risks, benefits and other options for treatment, the patient has consented to  Procedure(s): CORONARY ARTERY BYPASS GRAFTING (CABG) (N/A) RADIAL ARTERY HARVEST (Left) TRANSESOPHAGEAL ECHOCARDIOGRAM (TEE) (N/A) as a surgical intervention.  The patient's history has been reviewed, patient examined, no change in status, stable for surgery.  I have reviewed the patient's chart and labs.  Questions were answered to the patient's satisfaction.     Beverly [name]

## 2019-06-19 NOTE — Brief Op Note (Signed)
06/16/2019 - 06/19/2019  12:26 PM  PATIENT:  Beverly [name]  44 y.o. female  PRE-OPERATIVE DIAGNOSIS:  CAD  POST-OPERATIVE DIAGNOSIS:  CAD  PROCEDURE:  Procedure(s): CORONARY ARTERY BYPASS GRAFTING (CABG) x 3 WITH ENDOSCOPIC HARVESTING OF LEFT GREATER SAPHENOUS VEIN. LIMA TO LAD, SVG TO PD, SVG TO RAMUS (N/A) TRANSESOPHAGEAL ECHOCARDIOGRAM (TEE) (N/A) Application Of Wound Vac USING PREVENA INCISIONAL DRESSING (N/A)  SURGEON:  Surgeon(s) and Role:    * [name], MD - Primary  PHYSICIAN ASSISTANT: Natash Berman PA-C, ERIN BARRETT PA-C  ANESTHESIA:   general  EBL:  760 mL   BLOOD ADMINISTERED:none  DRAINS: LEFT PLEURAL AND MEDIASTINAL CHEST TUBES   LOCAL MEDICATIONS USED:  NONE  SPECIMEN:  No Specimen  DISPOSITION OF SPECIMEN:  N/A  COUNTS:  YES  TOURNIQUET:  * No tourniquets in log *  DICTATION: .Other Dictation: Dictation Number [id number]  PLAN OF CARE: Admit to inpatient   PATIENT DISPOSITION:  ICU - intubated and hemodynamically stable.   Delay start of Pharmacological VTE agent (>24hrs) due to surgical blood loss or risk of bleeding: yes

## 2019-06-19 NOTE — Progress Notes (Signed)
RT obtained ABG on pt with the following results. RT will continue to monitor.    Results for Beverly [name], Beverly [name] (MRN [id number]) as of 06/19/2019 05:36  Ref. Range 06/19/2019 04:19  Sample type Unknown ARTERIAL DRAW  FIO2 Unknown 21.00  pH, Arterial Latest Ref Range: 7.350 - 7.450  7.454 (H)  pCO2 arterial Latest Ref Range: 32.0 - 48.0 mmHg 37.6  pO2, Arterial Latest Ref Range: 83.0 - 108.0 mmHg 108  Acid-Base Excess Latest Ref Range: 0.0 - 2.0 mmol/L 2.4 (H)  Bicarbonate Latest Ref Range: 20.0 - 28.0 mmol/L 26.1  O2 Saturation Latest Units: % 98.5  Patient temperature Unknown 36.8  Collection site Unknown LEFT RADIAL  Allens test (pass/fail) Latest Ref Range: PASS  PASS

## 2019-06-19 NOTE — Anesthesia Procedure Notes (Signed)
Arterial Line Insertion Start/End3/25/2021 6:55 AM, 06/19/2019 7:15 AM Performed by: Cecile Hearing, MD, anesthesiologist  Patient location: Pre-op. Preanesthetic checklist: patient identified, IV checked, site marked, risks and benefits discussed, surgical consent, monitors and equipment checked, pre-op evaluation, timeout performed and anesthesia consent Lidocaine 1% used for infiltration Right, brachial was placed Catheter size: 20 Fr Hand hygiene performed  and maximum sterile barriers used   Attempts: 1 Procedure performed using ultrasound guided technique. Ultrasound Notes:anatomy identified, needle tip was noted to be adjacent to the nerve/plexus identified, no ultrasound evidence of intravascular and/or intraneural injection and image(s) printed for medical record Following insertion, dressing applied, Biopatch and line sutured. Post procedure assessment: normal and unchanged  Patient tolerated the procedure well with no immediate complications.

## 2019-06-19 NOTE — Transfer of Care (Signed)
Immediate Anesthesia Transfer of Care Note  Patient: Beverly [name]  Procedure(s) Performed: CORONARY ARTERY BYPASS GRAFTING (CABG) x 3 WITH ENDOSCOPIC HARVESTING OF LEFT GREATER SAPHENOUS VEIN. LIMA TO LAD, SVG TO PD, SVG TO RAMUS (N/A Chest) TRANSESOPHAGEAL ECHOCARDIOGRAM (TEE) (N/A ) Application Of Wound Vac USING PREVENA INCISIONAL DRESSING (N/A Chest)  Patient Location: ICU  Anesthesia Type:General  Level of Consciousness: Patient remains intubated per anesthesia plan  Airway & Oxygen Therapy: Patient remains intubated per anesthesia plan and Patient placed on Ventilator (see vital sign flow sheet for setting)  Post-op Assessment: Report given to RN and Post -op Vital signs reviewed and stable  Post vital signs: Reviewed and stable  Last Vitals:  Vitals Value Taken Time  BP 97/63 06/19/19 1356  Temp    Pulse 80 06/19/19 1404  Resp 18 06/19/19 1404  SpO2 97 % 06/19/19 1404  Vitals shown include unvalidated device data.  Last Pain:  Vitals:   06/19/19 0435  TempSrc: Oral  PainSc:       Patients Stated Pain Goal: 0 (06/18/19 2000)  Complications: No apparent anesthesia complications

## 2019-06-19 NOTE — Progress Notes (Signed)
PROGRESS NOTE    Beverly [name]  HUT:[id number] DOB: [birth year] DOA: 06/16/2019 PCP: Bonnita Nasuti, MD   Brief Narrative: 44 y.o. female with medical history significant forCAD with stent, insulin-dependent diabetes mellitus, hypertension, history of bariatric surgery, and history of lower extremity DVT after surgery no longer anticoagulated, now presenting to emergency department for evaluation of chest pain. The patient reports a few days of waxing and waning chest pain. Symptoms initially felt more like indigestion but she began to experience an ache in her left jaw that she reports to be similar to when she had a heart attack and required stent.   Seen by cardiology underwent cardiac cath with severe three-vessel disease, cardiothoracic surgery consulted for CABG,  Subjective: Seen today after CABG. Transferred to ICU. Remains on ventilator, sedated.   Assessment & Plan:  Unstable angina with chest pain: LHC showed:"Severe three-vessel disease: 100% occlusion of small distal RCA proximal to previously placed stent, diffuse 70-90% mid LAD stenosis of wraparound LAD, diffuse 65% proximal large ramus intermedius" 3/24- pt underwent CABG x3.Patient is post CABG currently continue on postop ventilator, pressors supportive measures as per cardiothoracic surgery.  Continue aspirin, Lipitor, metoprolol.  Cardiology following.  HLD-continue increased dose of Lipitor 80 mg.  Hypertension: Currently on pressors postop. meds as #1.  Insulin-requiring or dependent type II diabetes mellitus on insulin pump at home, hemoglobin A1c uncontrolled 10.2 3/23.Patient had previously tried Metformin in the past but had GI upset.  Cardiology recommending Jardiance prior to discharge.  Currently on insulin infusion postop. Recent Labs  Lab 06/18/19 1456 06/18/19 1803 06/18/19 2116 06/19/19 0556 06/19/19 1421  GLUCAP 140* 134* 231* 106* 136*   Positive D-dimer/history of DVT : CT negative for PE.     Anemia: Hemoglobin is stable.  Monitor.  Morbid obesity with BMI Body mass index is 47.05 kg/m: Patient will benefit with weight loss and healthy lifestyle.  Of note she has had bariatric surgery in the past.  Leukocytosis. ?  Etiology, appears mostly postop so likely reactive. Monitor. Watch for fever.Status post antibiotics periop.  Hypokalemia: improved  Nutrition: Diet Order    None      DVT prophylaxis: On Lovenox, held for CABG.   Code Status:full Family Communication: plan of care discussed with nursing staff.  Disposition Plan: Patient is from:home Anticipated Disposition: to TBD Barriers to discharge or conditions that needs to be met prior to discharge: Admitted with chest pain underwent cath and subsequently CABG currently in ICU.  Consultants: CARDIOLOGY, TCTS Procedures: LHC 3/24 SUMMARY  Severe three-vessel disease: 100% occlusion of small distal RCA proximal to previously placed stent, diffuse 70-90% mid LAD stenosis of wraparound LAD, diffuse 65% proximal large ramus intermedius  Mildly reduced EF with basal inferior hypo-/akinesis and anterolateral hypokinesis.  Borderline elevated LVEDP  RECOMMENDATIONS  Sheath will be removed in PACU holding area with manual pressure held for hemostasis.  Transfer to cardiac telemetry unit for post sheath removal care.  CVTS consultation recommended based on three-vessel CAD -> existing disease is not favorable for PCI with long LAD lesion CABG  Microbiology:see note   Medications: Scheduled Meds: . [START ON 06/20/2019] acetaminophen  1,000 mg Oral Q6H   Or  . [START ON 06/20/2019] acetaminophen (TYLENOL) oral liquid 160 mg/5 mL  1,000 mg Per Tube Q6H  . acetaminophen (TYLENOL) oral liquid 160 mg/5 mL  650 mg Per Tube Once   Or  . acetaminophen  650 mg Rectal Once  . [START ON 06/20/2019] aspirin EC  325 mg Oral Daily   Or  . [START ON 06/20/2019] aspirin  324 mg Per Tube Daily  . atorvastatin  80 mg Oral  q1800  . [START ON 06/20/2019] bisacodyl  10 mg Oral Daily   Or  . [START ON 06/20/2019] bisacodyl  10 mg Rectal Daily  . chlorhexidine  15 mL Mouth/Throat NOW  . Chlorhexidine Gluconate Cloth  6 each Topical Daily  . [START ON 06/20/2019] docusate sodium  200 mg Oral Daily  . metoprolol tartrate  12.5 mg Oral BID   Or  . metoprolol tartrate  12.5 mg Per Tube BID  . [START ON 06/21/2019] pantoprazole  40 mg Oral Daily  . [START ON 06/20/2019] sodium chloride flush  3 mL Intravenous Q12H   Continuous Infusions: . sodium chloride Stopped (06/19/19 1353)  . [START ON 06/20/2019] sodium chloride    . sodium chloride    . albumin human 12.5 g (06/19/19 1442)  . cefUROXime (ZINACEF)  IV    . dexmedetomidine (PRECEDEX) IV infusion 0.5 mcg/kg/hr (06/19/19 1415)  . famotidine (PEPCID) IV 100 mL/hr at 06/19/19 1408  . insulin 3 mL/hr at 06/19/19 1400  . lactated ringers    . lactated ringers    . lactated ringers 20 mL/hr at 06/19/19 1400  . magnesium sulfate 4 g (06/19/19 1426)  . nitroGLYCERIN Stopped (06/19/19 1354)  . phenylephrine (NEO-SYNEPHRINE) Adult infusion 40 mcg/min (06/19/19 1400)  . potassium chloride 10 mEq (06/19/19 1441)  . vancomycin      Antimicrobials: Anti-infectives (From admission, onward)   Start     Dose/Rate Route Frequency Ordered Stop   06/19/19 1845  vancomycin (VANCOCIN) IVPB 1000 mg/200 mL premix     1,000 mg 200 mL/hr over 60 Minutes Intravenous  Once 06/19/19 1349     06/19/19 1630  cefUROXime (ZINACEF) 1.5 g in sodium chloride 0.9 % 100 mL IVPB     1.5 g 200 mL/hr over 30 Minutes Intravenous Every 12 hours 06/19/19 1349 06/21/19 1629   06/19/19 0400  vancomycin (VANCOCIN) 1,500 mg in sodium chloride 0.9 % 250 mL IVPB  Status:  Discontinued     1,500 mg 125 mL/hr over 120 Minutes Intravenous To Surgery 06/18/19 1713 06/18/19 1718   06/19/19 0400  cefUROXime (ZINACEF) 1.5 g in sodium chloride 0.9 % 100 mL IVPB  Status:  Discontinued     1.5 g 200 mL/hr  over 30 Minutes Intravenous To Surgery 06/18/19 1713 06/18/19 1743   06/19/19 0400  cefUROXime (ZINACEF) 750 mg in sodium chloride 0.9 % 100 mL IVPB  Status:  Discontinued     750 mg 200 mL/hr over 30 Minutes Intravenous To Surgery 06/18/19 1713 06/18/19 1743   06/19/19 0400  vancomycin (VANCOREADY) IVPB 1500 mg/300 mL  Status:  Discontinued     1,500 mg 150 mL/hr over 120 Minutes Intravenous Once 06/18/19 1719 06/18/19 1743   06/19/19 0400  vancomycin (VANCOCIN) 1,500 mg in sodium chloride 0.9 % 250 mL IVPB     1,500 mg 125 mL/hr over 120 Minutes Intravenous To Surgery 06/18/19 2124 06/19/19 0840   06/19/19 0400  cefUROXime (ZINACEF) 1.5 g in sodium chloride 0.9 % 100 mL IVPB     1.5 g 200 mL/hr over 30 Minutes Intravenous To Surgery 06/18/19 2124 06/19/19 0830   06/19/19 0400  cefUROXime (ZINACEF) 750 mg in sodium chloride 0.9 % 100 mL IVPB  Status:  Discontinued     750 mg 200 mL/hr over 30 Minutes Intravenous To Surgery 06/18/19 2117 06/19/19  1349       Objective: Vitals: Today's Vitals   06/19/19 1353 06/19/19 1356 06/19/19 1400 06/19/19 1500  BP:  97/63    Pulse:  82 80 90  Resp:  (!) 22  (!) 28  Temp:      TempSrc:      SpO2: 96% 96% 97% 100%  Weight:      Height:      PainSc:        Intake/Output Summary (Last 24 hours) at 06/19/2019 1556 Last data filed at 06/19/2019 1500 Gross per 24 hour  Intake 4426.94 ml  Output 1595 ml  Net 2831.94 ml   Filed Weights   06/16/19 2304 06/19/19 0435  Weight: 111.1 kg 109.3 kg   Weight change:    Intake/Output from previous day: 03/24 0701 - 03/25 0700 In: 250 [IV Piggyback:250] Out: -  Intake/Output this shift: Total I/O In: 4176.9 [I.V.:3556.9; Blood:420; IV Piggyback:200] Out: 1595 [Urine:815; Blood:760; Chest Tube:20]  Examination:  General exam: Sedated on ventilator, morbidly obese.   HEENT:Oral mucosa moist, Ear/Nose WNL grossly,dentition normal. Respiratory system: bilaterally clear,no wheezing or  crackles,no use of accessory muscle, non tender. Cardiovascular system: S1 & S2 +, regular, No JVD. Gastrointestinal system: Abdomen soft, obese,BS sluggish. Nervous System: On ventilator, sedated.   Extremities: mild edema, distal peripheral pulses palpable.  Skin: No rashes,no icterus. MSK: Normal muscle bulk,tone, power.  Data Reviewed: I have personally reviewed following labs and imaging studies CBC: Recent Labs  Lab 06/17/19 0500 06/17/19 0500 06/17/19 1754 06/17/19 1754 06/18/19 0141 06/19/19 0336 06/19/19 1201 06/19/19 1415 06/19/19 1435  WBC 5.3  --  8.3  --  8.6 7.1  --   --  18.2*  HGB 10.0*   < > 14.3   < > 13.5 13.1 8.2* 11.2* 11.9*  HCT 30.2*   < > 42.0   < > 39.0 37.9 23.8* 33.0* 34.6*  MCV 90.4  --  88.1  --  87.4 87.7  --   --  88.9  PLT 209   < > 330  --  278 272 208  --  231   < > = values in this interval not displayed.   Basic Metabolic Panel: Recent Labs  Lab 06/16/19 1620 06/17/19 0500 06/18/19 0141 06/19/19 0336 06/19/19 1415  NA 137 141 138 139 142  K 3.9 3.4* 3.9 3.6 3.7  CL 100 107 105 106  --   CO2 28 25 25 24   --   GLUCOSE 227* 173* 198* 123*  --   BUN 11 10 13 8   --   CREATININE 0.51 0.49 0.50 0.44  --   CALCIUM 9.3 8.0* 8.8* 8.8*  --    GFR: Estimated Creatinine Clearance: 100.6 mL/min (by C-G formula based on SCr of 0.44 mg/dL). Liver Function Tests: No results for input(s): AST, ALT, ALKPHOS, BILITOT, PROT, ALBUMIN in the last 168 hours. No results for input(s): LIPASE, AMYLASE in the last 168 hours. No results for input(s): AMMONIA in the last 168 hours. Coagulation Profile: Recent Labs  Lab 06/19/19 1435  INR 1.3*   Cardiac Enzymes: No results for input(s): CKTOTAL, CKMB, CKMBINDEX, TROPONINI in the last 168 hours. BNP (last 3 results) No results for input(s): PROBNP in the last 8760 hours. HbA1C: Recent Labs    06/17/19 0500  HGBA1C 10.2*   CBG: Recent Labs  Lab 06/18/19 1456 06/18/19 1803 06/18/19 2116  06/19/19 0556 06/19/19 1421  GLUCAP 140* 134* 231* 106* 136*   Lipid Profile: Recent  Labs    06/17/19 0500  CHOL 214*  HDL 35*  LDLCALC 156*  TRIG 113  CHOLHDL 6.1   Thyroid Function Tests: No results for input(s): TSH, T4TOTAL, FREET4, T3FREE, THYROIDAB in the last 72 hours. Anemia Panel: Recent Labs    06/17/19 0500  VITAMINB12 314  FOLATE 29.7  FERRITIN 29  TIBC 323  IRON 100  RETICCTPCT 2.1   Sepsis Labs: No results for input(s): PROCALCITON, LATICACIDVEN in the last 168 hours.  Recent Results (from the past 240 hour(s))  SARS CORONAVIRUS 2 (TAT 6-24 HRS) Nasopharyngeal Nasopharyngeal Swab     Status: None   Collection Time: 06/16/19 11:38 PM   Specimen: Nasopharyngeal Swab  Result Value Ref Range Status   SARS Coronavirus 2 NEGATIVE NEGATIVE Final    Comment: (NOTE) SARS-CoV-2 target nucleic acids are NOT DETECTED. The SARS-CoV-2 RNA is generally detectable in upper and lower respiratory specimens during the acute phase of infection. Negative results do not preclude SARS-CoV-2 infection, do not rule out co-infections with other pathogens, and should not be used as the sole basis for treatment or other patient management decisions. Negative results must be combined with clinical observations, patient history, and epidemiological information. The expected result is Negative. Fact Sheet for Patients: SugarRoll.be Fact Sheet for Healthcare Providers: https://www.woods-mathews.com/ This test is not yet approved or cleared by the [place] FDA and  has been authorized for detection and/or diagnosis of SARS-CoV-2 by FDA under an Emergency Use Authorization (EUA). This EUA will remain  in effect (meaning this test can be used) for the duration of the COVID-19 declaration under Section 56 4(b)(1) of the Act, 21 U.S.C. section 360bbb-3(b)(1), unless the authorization is terminated or revoked sooner. Performed at [facility], [place] [address] [place], [place] [address]   Surgical pcr screen     Status: None   Collection Time: 06/18/19  3:12 AM   Specimen: Nasal Mucosa; Nasal Swab  Result Value Ref Range Status   MRSA, PCR NEGATIVE NEGATIVE Final   Staphylococcus aureus NEGATIVE NEGATIVE Final    Comment: (NOTE) The Xpert SA Assay (FDA approved for NASAL specimens in patients 45 years of age and older), is one component of a comprehensive surveillance program. It is not intended to diagnose infection nor to guide or monitor treatment. Performed at [facility], [place] [address], [place], [place] [address]       Radiology Studies: CARDIAC CATHETERIZATION  Result Date: 06/18/2019  There is mild left ventricular systolic dysfunction. The left ventricular ejection fraction is 35-45% by visual estimate.  LV end diastolic pressure is mildly elevated.  There is mild (2+) mitral regurgitation.  --------------------------------  Prox LAD to Mid LAD lesion is 90% stenosed. Mid LAD lesion is 75% stenosed. Dist LAD lesion is 85% stenosed.  Ramus lesion is 65% stenosed.  Dist RCA lesion is 100% stenosed like via proximal edge of previously placed bare-metal stent  SUMMARY  Severe three-vessel disease: 100% occlusion of small distal RCA proximal to previously placed stent, diffuse 70-90% mid LAD stenosis of wraparound LAD, diffuse 65% proximal large ramus intermedius  Mildly reduced EF with basal inferior hypo-/akinesis and anterolateral hypokinesis.  Borderline elevated LVEDP RECOMMENDATIONS  Sheath will be removed in PACU holding area with manual pressure held for hemostasis.  Transfer to cardiac telemetry unit for post sheath removal care.  CVTS consultation recommended based on three-vessel CAD -> existing disease is not favorable for PCI with long LAD lesion Glenetta Hew, MD  DG  Chest Port 1 View  Result Date: 06/19/2019 CLINICAL DATA:  Status post coronary bypass grafting EXAM: PORTABLE  CHEST 1 VIEW COMPARISON:  06/16/2019 FINDINGS: Postsurgical changes are noted. Endotracheal tube, gastric catheter and right jugular central line are seen. No pneumothorax is noted. Mediastinal drains are noted. Overall inspiratory effort is poor. Increased density is noted over the left hemithorax likely related to a posteriorly layering effusion. IMPRESSION: Postsurgical changes with tubes and lines as described. Slight increased density left hemithorax likely related to posterior effusion. Electronically Signed   By: Inez Catalina M.D.   On: 06/19/2019 14:12   VAS US DOPPLER PRE CABG  Result Date: 06/18/2019 PREOPERATIVE VASCULAR EVALUATION  Indications: Pre-CABG. Performing Technologist: Maudry Mayhew MHA, RVT, RDCS, RDMS  Examination Guidelines: A complete evaluation includes B-mode imaging, spectral Doppler, color Doppler, and power Doppler as needed of all accessible portions of each vessel. Bilateral testing is considered an integral part of a complete examination. Limited examinations for reoccurring indications may be performed as noted.  Right Carotid Findings: +----------+-------+-------+--------+---------------------------------+--------+           PSV    EDV    StenosisDescribe                         Comments           cm/s   cm/s                                                     +----------+-------+-------+--------+---------------------------------+--------+ CCA Prox  116    12                                                       +----------+-------+-------+--------+---------------------------------+--------+ CCA Distal59     12             heterogenous and irregular                +----------+-------+-------+--------+---------------------------------+--------+ ICA Prox  51     16             heterogenous, irregular and                                               calcific                                   +----------+-------+-------+--------+---------------------------------+--------+ ICA Distal83     24                                                       +----------+-------+-------+--------+---------------------------------+--------+ ECA       72     7                                                        +----------+-------+-------+--------+---------------------------------+--------+  Portions of this table do not appear on this page. +----------+--------+-------+----------------+------------+           PSV cm/sEDV cmsDescribe        Arm Pressure +----------+--------+-------+----------------+------------+ Subclavian171            Multiphasic, WNL             +----------+--------+-------+----------------+------------+ +---------+--------+--+--------+--+---------+ VertebralPSV cm/s61EDV cm/s19Antegrade +---------+--------+--+--------+--+---------+ Left Carotid Findings: +----------+--------+-------+--------+--------------------------------+--------+           PSV cm/sEDV    StenosisDescribe                        Comments                   cm/s                                                    +----------+--------+-------+--------+--------------------------------+--------+ CCA Prox  107     9                                                       +----------+--------+-------+--------+--------------------------------+--------+ CCA Distal74      19             smooth, heterogenous and                                                  calcific                                 +----------+--------+-------+--------+--------------------------------+--------+ ICA Prox  74      19             irregular and heterogenous               +----------+--------+-------+--------+--------------------------------+--------+ ICA Distal104     35                                                       +----------+--------+-------+--------+--------------------------------+--------+ ECA       90      10                                                      +----------+--------+-------+--------+--------------------------------+--------+ +----------+--------+--------+----------------+------------+ SubclavianPSV cm/sEDV cm/sDescribe        Arm Pressure +----------+--------+--------+----------------+------------+           120             Multiphasic, [id number]          +----------+--------+--------+----------------+------------+ +---------+--------+--+--------+--+---------+ VertebralPSV cm/s61EDV cm/s20Antegrade +---------+--------+--+--------+--+---------+  ABI Findings: +---------+-----------------+-----+-------------------+------------------------+ Right    Rt Pressure      IndexWaveform  Comment                           (mmHg)                                                            +---------+-----------------+-----+-------------------+------------------------+ Brachial                       triphasic          Unable to obtain                                                           pressure due to                                                            catheterization          +---------+-----------------+-----+-------------------+------------------------+ PTA      24               0.15 dampened monophasic                         +---------+-----------------+-----+-------------------+------------------------+ DP       255              1.61 monophasic                                  +---------+-----------------+-----+-------------------+------------------------+ Great Toe198              1.25                                             +---------+-----------------+-----+-------------------+------------------------+ +---------+------------------+-----+-------------------+-------+ Left     Lt Pressure (mmHg)IndexWaveform            Comment +---------+------------------+-----+-------------------+-------+ Brachial 158                    triphasic                  +---------+------------------+-----+-------------------+-------+ PTA      107               0.68 dampened monophasic        +---------+------------------+-----+-------------------+-------+ DP       255               1.61 triphasic                  +---------+------------------+-----+-------------------+-------+ Great Toe84                0.53                            +---------+------------------+-----+-------------------+-------+ +-------+---------------+----------------+  ABI/TBIToday's ABI/TBIPrevious ABI/TBI +-------+---------------+----------------+ Right  0.15                            +-------+---------------+----------------+ Left   0.68                            +-------+---------------+----------------+  Right Doppler Findings: +-----------+--------+-----+----------+----------------------------------------+ Site       PressureIndexDoppler   Comments                                 +-----------+--------+-----+----------+----------------------------------------+ Brachial                triphasic Unable to obtain pressure due to                                           catheterization                          +-----------+--------+-----+----------+----------------------------------------+ Radial                  monophasic                                         +-----------+--------+-----+----------+----------------------------------------+ Ulnar                   triphasic                                          +-----------+--------+-----+----------+----------------------------------------+ Palmar Arch                       Unable to evaluate due to recent                                           catheterization                           +-----------+--------+-----+----------+----------------------------------------+  Left Doppler Findings: +--------+--------+-----+----------+--------+ Site    PressureIndexDoppler   Comments +--------+--------+-----+----------+--------+ [id number]          triphasic          +--------+--------+-----+----------+--------+ Radial               monophasic         +--------+--------+-----+----------+--------+ Ulnar                triphasic          +--------+--------+-----+----------+--------+  Summary: Right Carotid: Velocities in the right ICA are consistent with a 1-39% stenosis. Left Carotid: Velocities in the left ICA are consistent with a 1-39% stenosis. Vertebrals:  Bilateral vertebral arteries demonstrate antegrade flow. Subclavians: Normal flow hemodynamics were seen in bilateral subclavian              arteries. Right ABI: Resting right ankle-brachial index indicates noncompressible right lower extremity arteries. The right toe-brachial index is normal. Left ABI: Resting left ankle-brachial index  indicates noncompressible left lower extremity arteries. The left toe-brachial index is abnormal. Left Upper Extremity: Doppler waveforms remain within normal limits with left radial compression. Doppler waveform obliterate with left ulnar compression.     Preliminary      LOS: 1 day   Time spent: More than 50% of that time was spent in counseling and/or coordination of care.  Antonieta Pert, MD [facility]  06/19/2019, 3:56 PM

## 2019-06-19 NOTE — Anesthesia Procedure Notes (Signed)
Procedure Name: Intubation Date/Time: 06/19/2019 8:24 AM Performed by: Griffin Dakin, CRNA Pre-anesthesia Checklist: Patient identified, Emergency Drugs available, Suction available and Patient being monitored Patient Re-evaluated:Patient Re-evaluated prior to induction Oxygen Delivery Method: Circle system utilized Preoxygenation: Pre-oxygenation with 100% oxygen Induction Type: IV induction Ventilation: Mask ventilation with difficulty, Oral airway inserted - appropriate to patient size and Two handed mask ventilation required Laryngoscope Size: Mac and 4 Grade View: Grade II Tube type: Oral Tube size: 7.5 mm Number of attempts: 1 Airway Equipment and Method: Stylet and Oral airway Placement Confirmation: ETT inserted through vocal cords under direct vision,  positive ETCO2 and breath sounds checked- equal and bilateral Secured at: 22 cm Tube secured with: Tape Dental Injury: Teeth and Oropharynx as per pre-operative assessment  Comments: Performed by Martinique Ingram SRNA

## 2019-06-20 ENCOUNTER — Inpatient Hospital Stay (HOSPITAL_COMMUNITY): Payer: 59

## 2019-06-20 LAB — POCT I-STAT, CHEM 8
BUN: 10 mg/dL (ref 6–20)
BUN: 8 mg/dL (ref 6–20)
BUN: 8 mg/dL (ref 6–20)
BUN: 9 mg/dL (ref 6–20)
BUN: 8 mg/dL (ref 6–20)
BUN: 9 mg/dL (ref 6–20)
Calcium, Ion: 1.25 mmol/L (ref 1.15–1.40)
Calcium, Ion: 1.17 mmol/L (ref 1.15–1.40)
Calcium, Ion: 1.02 mmol/L — ABNORMAL LOW (ref 1.15–1.40)
Calcium, Ion: 1.23 mmol/L (ref 1.15–1.40)
Calcium, Ion: 1.09 mmol/L — ABNORMAL LOW (ref 1.15–1.40)
Calcium, Ion: 1.09 mmol/L — ABNORMAL LOW (ref 1.15–1.40)
Chloride: 102 mmol/L (ref 98–111)
Chloride: 109 mmol/L (ref 98–111)
Chloride: 101 mmol/L (ref 98–111)
Chloride: 105 mmol/L (ref 98–111)
Chloride: 103 mmol/L (ref 98–111)
Chloride: 106 mmol/L (ref 98–111)
Creatinine, Ser: 0.3 mg/dL — ABNORMAL LOW (ref 0.44–1.00)
Creatinine, Ser: <0.2 mg/dL — ABNORMAL LOW (ref 0.44–1.00)
Creatinine, Ser: 0.3 mg/dL — ABNORMAL LOW (ref 0.44–1.00)
Creatinine, Ser: <0.2 mg/dL — ABNORMAL LOW (ref 0.44–1.00)
Creatinine, Ser: 0.3 mg/dL — ABNORMAL LOW (ref 0.44–1.00)
Creatinine, Ser: 0.3 mg/dL — ABNORMAL LOW (ref 0.44–1.00)
Glucose, Bld: 181 mg/dL — ABNORMAL HIGH (ref 70–99)
Glucose, Bld: 140 mg/dL — ABNORMAL HIGH (ref 70–99)
Glucose, Bld: 114 mg/dL — ABNORMAL HIGH (ref 70–99)
Glucose, Bld: 134 mg/dL — ABNORMAL HIGH (ref 70–99)
Glucose, Bld: 137 mg/dL — ABNORMAL HIGH (ref 70–99)
Glucose, Bld: 136 mg/dL — ABNORMAL HIGH (ref 70–99)
HCT: 33 % — ABNORMAL LOW (ref 36.0–46.0)
HCT: 31 % — ABNORMAL LOW (ref 36.0–46.0)
HCT: 22 % — ABNORMAL LOW (ref 36.0–46.0)
HCT: 28 % — ABNORMAL LOW (ref 36.0–46.0)
HCT: 22 % — ABNORMAL LOW (ref 36.0–46.0)
HCT: 21 % — ABNORMAL LOW (ref 36.0–46.0)
Hemoglobin: 11.2 g/dL — ABNORMAL LOW (ref 12.0–15.0)
Hemoglobin: 10.5 g/dL — ABNORMAL LOW (ref 12.0–15.0)
Hemoglobin: 7.5 g/dL — ABNORMAL LOW (ref 12.0–15.0)
Hemoglobin: 9.5 g/dL — ABNORMAL LOW (ref 12.0–15.0)
Hemoglobin: 7.5 g/dL — ABNORMAL LOW (ref 12.0–15.0)
Hemoglobin: 7.1 g/dL — ABNORMAL LOW (ref 12.0–15.0)
Potassium: 3.9 mmol/L (ref 3.5–5.1)
Potassium: 3.3 mmol/L — ABNORMAL LOW (ref 3.5–5.1)
Potassium: 3.5 mmol/L (ref 3.5–5.1)
Potassium: 3.8 mmol/L (ref 3.5–5.1)
Potassium: 4.5 mmol/L (ref 3.5–5.1)
Potassium: 3.8 mmol/L (ref 3.5–5.1)
Sodium: 139 mmol/L (ref 135–145)
Sodium: 141 mmol/L (ref 135–145)
Sodium: 140 mmol/L (ref 135–145)
Sodium: 141 mmol/L (ref 135–145)
Sodium: 140 mmol/L (ref 135–145)
Sodium: 142 mmol/L (ref 135–145)
TCO2: 29 mmol/L (ref 22–32)
TCO2: 24 mmol/L (ref 22–32)
TCO2: 26 mmol/L (ref 22–32)
TCO2: 28 mmol/L (ref 22–32)
TCO2: 27 mmol/L (ref 22–32)
TCO2: 24 mmol/L (ref 22–32)

## 2019-06-20 LAB — CBC
HCT: 30.9 % — ABNORMAL LOW (ref 36.0–46.0)
HCT: 31.2 % — ABNORMAL LOW (ref 36.0–46.0)
Hemoglobin: 10.5 g/dL — ABNORMAL LOW (ref 12.0–15.0)
Hemoglobin: 10.4 g/dL — ABNORMAL LOW (ref 12.0–15.0)
MCH: 30.5 pg (ref 26.0–34.0)
MCH: 30.3 pg (ref 26.0–34.0)
MCHC: 34 g/dL (ref 30.0–36.0)
MCHC: 33.3 g/dL (ref 30.0–36.0)
MCV: 89.8 fL (ref 80.0–100.0)
MCV: 91 fL (ref 80.0–100.0)
Platelets: 201 10*3/uL (ref 150–400)
Platelets: 189 10*3/uL (ref 150–400)
RBC: 3.44 MIL/uL — ABNORMAL LOW (ref 3.87–5.11)
RBC: 3.43 MIL/uL — ABNORMAL LOW (ref 3.87–5.11)
RDW: 12.8 % (ref 11.5–15.5)
RDW: 13.1 % (ref 11.5–15.5)
WBC: 13.1 10*3/uL — ABNORMAL HIGH (ref 4.0–10.5)
WBC: 13.3 10*3/uL — ABNORMAL HIGH (ref 4.0–10.5)
nRBC: 0 % (ref 0.0–0.2)
nRBC: 0 % (ref 0.0–0.2)

## 2019-06-20 LAB — GLUCOSE, CAPILLARY
Glucose-Capillary: 121 mg/dL — ABNORMAL HIGH (ref 70–99)
Glucose-Capillary: 127 mg/dL — ABNORMAL HIGH (ref 70–99)
Glucose-Capillary: 135 mg/dL — ABNORMAL HIGH (ref 70–99)
Glucose-Capillary: 136 mg/dL — ABNORMAL HIGH (ref 70–99)
Glucose-Capillary: 136 mg/dL — ABNORMAL HIGH (ref 70–99)
Glucose-Capillary: 136 mg/dL — ABNORMAL HIGH (ref 70–99)
Glucose-Capillary: 166 mg/dL — ABNORMAL HIGH (ref 70–99)
Glucose-Capillary: 168 mg/dL — ABNORMAL HIGH (ref 70–99)
Glucose-Capillary: 168 mg/dL — ABNORMAL HIGH (ref 70–99)
Glucose-Capillary: 183 mg/dL — ABNORMAL HIGH (ref 70–99)
Glucose-Capillary: 184 mg/dL — ABNORMAL HIGH (ref 70–99)
Glucose-Capillary: 193 mg/dL — ABNORMAL HIGH (ref 70–99)
Glucose-Capillary: 200 mg/dL — ABNORMAL HIGH (ref 70–99)
Glucose-Capillary: 95 mg/dL (ref 70–99)

## 2019-06-20 LAB — POCT I-STAT 7, (LYTES, BLD GAS, ICA,H+H)
Acid-Base Excess: 5 mmol/L — ABNORMAL HIGH (ref 0.0–2.0)
Acid-base deficit: 2 mmol/L (ref 0.0–2.0)
Acid-base deficit: 1 mmol/L (ref 0.0–2.0)
Acid-base deficit: 5 mmol/L — ABNORMAL HIGH (ref 0.0–2.0)
Bicarbonate: 24.6 mmol/L (ref 20.0–28.0)
Bicarbonate: 28.6 mmol/L — ABNORMAL HIGH (ref 20.0–28.0)
Bicarbonate: 22.9 mmol/L (ref 20.0–28.0)
Bicarbonate: 19.6 mmol/L — ABNORMAL LOW (ref 20.0–28.0)
Calcium, Ion: 1.27 mmol/L (ref 1.15–1.40)
Calcium, Ion: 1.01 mmol/L — ABNORMAL LOW (ref 1.15–1.40)
Calcium, Ion: 1.08 mmol/L — ABNORMAL LOW (ref 1.15–1.40)
Calcium, Ion: 1.11 mmol/L — ABNORMAL LOW (ref 1.15–1.40)
HCT: 35 % — ABNORMAL LOW (ref 36.0–46.0)
HCT: 21 % — ABNORMAL LOW (ref 36.0–46.0)
HCT: 24 % — ABNORMAL LOW (ref 36.0–46.0)
HCT: 27 % — ABNORMAL LOW (ref 36.0–46.0)
Hemoglobin: 11.9 g/dL — ABNORMAL LOW (ref 12.0–15.0)
Hemoglobin: 7.1 g/dL — ABNORMAL LOW (ref 12.0–15.0)
Hemoglobin: 8.2 g/dL — ABNORMAL LOW (ref 12.0–15.0)
Hemoglobin: 9.2 g/dL — ABNORMAL LOW (ref 12.0–15.0)
O2 Saturation: 97 %
O2 Saturation: 100 %
O2 Saturation: 97 %
O2 Saturation: 95 %
Patient temperature: 98.4
Potassium: 3.9 mmol/L (ref 3.5–5.1)
Potassium: 3.8 mmol/L (ref 3.5–5.1)
Potassium: 3.8 mmol/L (ref 3.5–5.1)
Potassium: 3.5 mmol/L (ref 3.5–5.1)
Sodium: 139 mmol/L (ref 135–145)
Sodium: 141 mmol/L (ref 135–145)
Sodium: 142 mmol/L (ref 135–145)
Sodium: 141 mmol/L (ref 135–145)
TCO2: 26 mmol/L (ref 22–32)
TCO2: 30 mmol/L (ref 22–32)
TCO2: 24 mmol/L (ref 22–32)
TCO2: 21 mmol/L — ABNORMAL LOW (ref 22–32)
pCO2 arterial: 46.7 mmHg (ref 32.0–48.0)
pCO2 arterial: 37.7 mmHg (ref 32.0–48.0)
pCO2 arterial: 33.3 mmHg (ref 32.0–48.0)
pCO2 arterial: 34 mmHg (ref 32.0–48.0)
pH, Arterial: 7.331 — ABNORMAL LOW (ref 7.350–7.450)
pH, Arterial: 7.489 — ABNORMAL HIGH (ref 7.350–7.450)
pH, Arterial: 7.446 (ref 7.350–7.450)
pH, Arterial: 7.368 (ref 7.350–7.450)
pO2, Arterial: 100 mmHg (ref 83.0–108.0)
pO2, Arterial: 386 mmHg — ABNORMAL HIGH (ref 83.0–108.0)
pO2, Arterial: 89 mmHg (ref 83.0–108.0)
pO2, Arterial: 75 mmHg — ABNORMAL LOW (ref 83.0–108.0)

## 2019-06-20 LAB — BASIC METABOLIC PANEL
Anion gap: 12 (ref 5–15)
Anion gap: 9 (ref 5–15)
BUN: 12 mg/dL (ref 6–20)
BUN: 11 mg/dL (ref 6–20)
CO2: 20 mmol/L — ABNORMAL LOW (ref 22–32)
CO2: 22 mmol/L (ref 22–32)
Calcium: 7.6 mg/dL — ABNORMAL LOW (ref 8.9–10.3)
Calcium: 7.4 mg/dL — ABNORMAL LOW (ref 8.9–10.3)
Chloride: 105 mmol/L (ref 98–111)
Chloride: 102 mmol/L (ref 98–111)
Creatinine, Ser: 0.45 mg/dL (ref 0.44–1.00)
Creatinine, Ser: 0.52 mg/dL (ref 0.44–1.00)
GFR calc Af Amer: >60 mL/min (ref >60)
GFR calc Af Amer: >60 mL/min (ref >60)
GFR calc non Af Amer: >60 mL/min (ref >60)
GFR calc non Af Amer: >60 mL/min (ref >60)
Glucose, Bld: 151 mg/dL — ABNORMAL HIGH (ref 70–99)
Glucose, Bld: 191 mg/dL — ABNORMAL HIGH (ref 70–99)
Potassium: 3.9 mmol/L (ref 3.5–5.1)
Potassium: 3.7 mmol/L (ref 3.5–5.1)
Sodium: 137 mmol/L (ref 135–145)
Sodium: 133 mmol/L — ABNORMAL LOW (ref 135–145)

## 2019-06-20 LAB — MAGNESIUM
Magnesium: 2.3 mg/dL (ref 1.7–2.4)
Magnesium: 2.2 mg/dL (ref 1.7–2.4)

## 2019-06-20 MED ORDER — INSULIN DETEMIR 100 UNIT/ML ~~LOC~~ SOLN
25.0000 [IU] | Freq: Two times a day (BID) | SUBCUTANEOUS | Status: DC
  Administered 2019-06-20 – 2019-06-21 (×3): 25 [IU] via SUBCUTANEOUS
  Filled 2019-06-20 (×4): qty 0.25

## 2019-06-20 MED ORDER — POTASSIUM CHLORIDE CRYS ER 20 MEQ PO TBCR
20.0000 meq | EXTENDED_RELEASE_TABLET | ORAL | Status: AC
  Administered 2019-06-20 – 2019-06-21 (×3): 20 meq via ORAL
  Filled 2019-06-20 (×2): qty 1

## 2019-06-20 MED ORDER — GABAPENTIN 300 MG PO CAPS
300.0000 mg | ORAL_CAPSULE | Freq: Every day | ORAL | Status: DC
  Administered 2019-06-20 – 2019-06-23 (×4): 300 mg via ORAL
  Filled 2019-06-20 (×4): qty 1

## 2019-06-20 MED ORDER — SIMETHICONE 80 MG PO CHEW: 80.0000 mg | CHEWABLE_TABLET | Freq: Four times a day (QID) | ORAL | Status: DC | PRN

## 2019-06-20 MED ORDER — POTASSIUM CHLORIDE 10 MEQ/50ML IV SOLN
10.0000 meq | INTRAVENOUS | Status: AC
  Administered 2019-06-20 (×2): 10 meq via INTRAVENOUS
  Filled 2019-06-20: qty 50

## 2019-06-20 MED ORDER — INSULIN ASPART 100 UNIT/ML ~~LOC~~ SOLN
0.0000 [IU] | SUBCUTANEOUS | Status: DC
  Administered 2019-06-20 – 2019-06-21 (×5): 4 [IU] via SUBCUTANEOUS

## 2019-06-20 MED ORDER — ENOXAPARIN SODIUM 40 MG/0.4ML ~~LOC~~ SOLN
40.0000 mg | Freq: Every day | SUBCUTANEOUS | Status: DC
  Administered 2019-06-20 – 2019-06-23 (×4): 40 mg via SUBCUTANEOUS
  Filled 2019-06-20 (×4): qty 0.4

## 2019-06-20 MED ORDER — CARVEDILOL 6.25 MG PO TABS
6.2500 mg | ORAL_TABLET | Freq: Two times a day (BID) | ORAL | Status: DC
  Administered 2019-06-20 – 2019-06-21 (×3): 6.25 mg via ORAL
  Filled 2019-06-20 (×3): qty 1

## 2019-06-20 MED ORDER — FUROSEMIDE 10 MG/ML IJ SOLN
20.0000 mg | Freq: Two times a day (BID) | INTRAMUSCULAR | Status: AC
  Administered 2019-06-20 – 2019-06-22 (×6): 20 mg via INTRAVENOUS
  Filled 2019-06-20 (×6): qty 2

## 2019-06-20 NOTE — Discharge Summary (Signed)
Physician Discharge Summary  Patient ID: Beverly [name] MRN: [id number] DOB/AGE: 1975/06/28 44 y.o.  Admit date: 06/16/2019 Discharge date: 06/24/2019  Admission Diagnoses:  Patient Active Problem List   Diagnosis Date Noted  . Hypokalemia 06/17/2019  . Anemia 06/17/2019  . Positive D dimer   . Obesity   . Unstable angina ([place])   . Chest pain 06/16/2019  . CAD (coronary artery disease) 06/16/2019  . Hypertension   . Insulin-requiring or dependent type II diabetes mellitus ([place])   . History of DVT (deep vein thrombosis)    Discharge Diagnoses:   Patient Active Problem List   Diagnosis Date Noted  . 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 Hypokalemia 06/17/2019  . Anemia 06/17/2019  . Positive D dimer   . Obesity   . Unstable angina ([place])   . Chest pain 06/16/2019  . CAD (coronary artery disease) 06/16/2019  . Hypertension   . Insulin-requiring or dependent type II diabetes mellitus ([place])   . History of DVT (deep vein thrombosis)    Discharged Condition: good  History of Present Illness:   Beverly [name] is a 44 yo morbidly obese female with history of CAD S/P PCI in 2009, HTN, DM insulin dependent, H/O gastric bypass but unfortunately gained her weight back, and Hypokalemia.  She presented to the ED with complaints of chest pain.  This is had been occurring over the past several days and had been waxing and waning chest pain.  She initially felt this was indigestion.  However she developed pain in her jaw that was similar to her pain from her stent in 2009.  This was also associated with shortness of breath.  EKG in the ED showed no ischemic changes.  Her troponin levels were normal.  She was treated with GI cocktail and her initial symptoms improved.  CTA of the chest was obtained and showed no evidence of pulmonary embolism, but did show advanced coronary disease.  Cardiology consult was obtained and recommended admission for further workup.    Hospital Course:   She underwent cardiac  catheterization which showed a preserved EF of 55%.  She was found to have multivessel CAD.  It was felt coronary bypass grafting would be indicated and TCTS consult was requested.  The patient was evaluated by Dr. Roxan Hockey who was in agreement coronary artery bypass grafting would be indicated.  The risks and benefits of the procedure were explained to the patient and she was agreeable to proceed.  She was taken to the operating room and underwent CABG x 3 utilizing LIMA to LAD, SVG to Ramus Intermediate, and SVG to PDA.  She underwent endoscopic harvest of greater saphenous vein from her left thigh.  She tolerated the procedure without difficulty and was taken to he SICU in stable condition.  The patient was extubated without difficulty.  During her stay in the SICU the patient did not require any inotropic support.  Her chest tubes and arterial lines were removed without difficulty.  She was weaned off insulin drip and transitioned to Levemir.  She was maintaining NSR and was stable for transfer to the progressive care unit on 06/21/2019.  The patient has remained stable.  Her blood pressure increased and she was restarted on her home regimen of Cozaar.  She had diarrhea related to stool softner use which were discontinued.  She remains in NSR.  Her pacing wires have been removed.  She is ambulating and is medically stable for discharge home today.  Significant Diagnostic Studies:  cardiac graphics:    There is mild left ventricular systolic dysfunction. The left ventricular ejection fraction is 35-45% by visual estimate.  LV end diastolic pressure is mildly elevated.  There is mild (2+) mitral regurgitation.  --------------------------------  Prox LAD to Mid LAD lesion is 90% stenosed. Mid LAD lesion is 75% stenosed. Dist LAD lesion is 85% stenosed.  Ramus lesion is 65% stenosed.  Dist RCA lesion is 100% stenosed like via proximal edge of previously placed bare-metal stent   SUMMARY  Severe  three-vessel disease: 100% occlusion of small distal RCA proximal to previously placed stent, diffuse 70-90% mid LAD stenosis of wraparound LAD, diffuse 65% proximal large ramus intermedius  Mildly reduced EF with basal inferior hypo-/akinesis and anterolateral hypokinesis.  Borderline elevated LVEDP  Treatments: surgery:   1.  Median sternotomy.  2.  Extracorporeal circulation.   3.  Coronary artery bypass grafting x3 (left internal mammary artery to left anterior descending, saphenous vein graft to ramus intermedius, saphenous vein graft to posterior descending). 4.  Endoscopic vein harvest, left thigh.  Discharge Exam: Blood pressure 119/62, pulse 78, temperature 97.8 F (36.6 C), temperature source Oral, resp. rate 20, height 5' (1.524 m), weight 114.8 kg, SpO2 99 %.  General appearance: alert, cooperative and no distress Heart: regular rate and rhythm Lungs: clear to auscultation bilaterally Abdomen: soft, non-tender; bowel sounds normal; no masses,  no organomegaly Extremities: edema trace Wound: clean and dry   Discharge Medications:   The patient has been discharged on:   1.Beta Blocker:  Yes [ X  ]                              No   [   ]                              If No, reason:  2.Ace Inhibitor/ARB: Yes [ X  ]                                     No  [    ]                                     If No, reason:  3.Statin:   Yes [ X  ]                  No  [   ]                  If No, reason:  4.Ecasa:  Yes  [ X  ]                  No   [   ]                  If No, reason:    Discharge Instructions    Amb Referral to Cardiac Rehabilitation   Complete by: As directed    To [facility]   Diagnosis: CABG   CABG X ___: 3   After initial evaluation and assessments completed: Virtual Based Care may be provided alone or in conjunction with Phase 2 Cardiac Rehab based on patient barriers.: Yes     Allergies as  of 06/24/2019      Reactions   Shellfish-derived  Products Diarrhea   Tape Other (See Comments)   Can blister the skin      Medication List    STOP taking these medications   ibuprofen 200 MG tablet Commonly known as: ADVIL     TAKE these medications   acetaminophen 500 MG tablet Commonly known as: TYLENOL Take 2 tablets (1,000 mg total) by mouth every 6 (six) hours as needed.   aluminum-magnesium hydroxide-simethicone 200-200-20 MG/5ML Susp Commonly known as: MAALOX Take 30 mLs by mouth 3 (three) times daily as needed (for gas/indigestion).   aspirin 325 MG EC tablet Take 1 tablet (325 mg total) by mouth daily. What changed:   medication strength  how much to take   atorvastatin 80 MG tablet Commonly known as: LIPITOR Take 1 tablet (80 mg total) by mouth daily at 6 PM. What changed:   medication strength  how much to take  when to take this   carvedilol 12.5 MG tablet Commonly known as: COREG Take 12.5 mg by mouth in the morning.   diphenoxylate-atropine 2.5-0.025 MG tablet Commonly known as: LOMOTIL Take 1-2 tablets by mouth 4 (four) times daily as needed for diarrhea or loose stools.   gabapentin 300 MG capsule Commonly known as: NEURONTIN Take 300 mg by mouth at bedtime.   Gas-X Extra Strength 125 MG chewable tablet Generic drug: simethicone Chew 125 mg by mouth every 6 (six) hours as needed for flatulence.   HumaLOG 100 UNIT/ML injection Generic drug: insulin lispro Inject into the skin See admin instructions. For use in [employer] insulin pump   insulin pump Soln Inject 1 each into the skin continuous. [employer]- Uses Humalog   losartan 100 MG tablet Commonly known as: COZAAR Take 100 mg by mouth daily.   One-A-Day Womens Formula Tabs Take 1 tablet by mouth daily with breakfast.   oxyCODONE 5 MG immediate release tablet Commonly known as: Oxy IR/ROXICODONE Take 1-2 tablets (5-10 mg total) by mouth every 4 (four) hours as needed for severe pain.      Follow-up Information     [name], MD Follow up on 07/15/2019.   Specialty: Cardiothoracic Surgery Why: Appointment is at 10:45, please get CXR at 10:15 at [facility] located on first floof of our office building Contact information: [address] [place] [address] [phone]        Dyann Kief, [place]. Go on 07/09/2019.   Specialty: Cardiology Why: @11 :15am for hospital follow up  Contact information: [address] [place] [phone]           Signed:  [phone] PA-C 06/24/2019, 8:28 AM

## 2019-06-20 NOTE — Progress Notes (Signed)
Progress Note  Patient Name: Beverly [name] Date of Encounter: 06/20/2019  Primary Cardiologist: No primary care provider on file.   Subjective   Chest wall sore.   Inpatient Medications    Scheduled Meds: . acetaminophen  1,000 mg Oral Q6H   Or  . acetaminophen (TYLENOL) oral liquid 160 mg/5 mL  1,000 mg Per Tube Q6H  . aspirin EC  325 mg Oral Daily   Or  . aspirin  324 mg Per Tube Daily  . atorvastatin  80 mg Oral q1800  . bisacodyl  10 mg Oral Daily   Or  . bisacodyl  10 mg Rectal Daily  . Chlorhexidine Gluconate Cloth  6 each Topical Daily  . docusate sodium  200 mg Oral Daily  . metoprolol tartrate  12.5 mg Oral BID   Or  . metoprolol tartrate  12.5 mg Per Tube BID  . [START ON 06/21/2019] pantoprazole  40 mg Oral Daily  . sodium chloride flush  3 mL Intravenous Q12H   Continuous Infusions: . sodium chloride 20 mL/hr at 06/20/19 0700  . sodium chloride    . sodium chloride    . albumin human 12.5 g (06/19/19 1645)  . cefUROXime (ZINACEF)  IV Stopped (06/20/19 0446)  . dexmedetomidine (PRECEDEX) IV infusion Stopped (06/19/19 1515)  . famotidine (PEPCID) IV Stopped (06/19/19 1439)  . insulin 1.6 mL/hr at 06/20/19 0700  . lactated ringers    . lactated ringers    . lactated ringers 20 mL/hr at 06/20/19 0700  . nitroGLYCERIN Stopped (06/19/19 2141)  . phenylephrine (NEO-SYNEPHRINE) Adult infusion 10 mcg/min (06/20/19 0700)   PRN Meds: sodium chloride, albumin human, dextrose, lactated ringers, metoprolol tartrate, midazolam, morphine injection, ondansetron (ZOFRAN) IV, oxyCODONE, sodium chloride flush, traMADol   Vital Signs    Vitals:   06/20/19 0615 06/20/19 0630 06/20/19 0645 06/20/19 0700  BP:    (!) 119/57  Pulse: 86 86 86 87  Resp: (!) 24 (!) 23 (!) 25 (!) 27  Temp:      TempSrc:      SpO2: 94% 95% 95% 95%  Weight:      Height:        Intake/Output Summary (Last 24 hours) at 06/20/2019 0733 Last data filed at 06/20/2019 0700 Gross per 24 hour    Intake 6226.97 ml  Output 2400 ml  Net 3826.97 ml   Last 3 Weights 06/20/2019 06/19/2019 06/16/2019  Weight (lbs) 261 lb 14.5 oz 240 lb 14.4 oz 245 lb  Weight (kg) 118.8 kg 109.272 kg 111.131 kg      Telemetry    Sinus - Personally Reviewed  ECG    Sinus- Personally Reviewed  Physical Exam   General: Well developed, well nourished, NAD  HEENT: OP clear, mucus membranes moist  SKIN: warm, dry. No rashes. Neuro: No focal deficits  Musculoskeletal: Muscle strength 5/5 all ext  Psychiatric: Mood and affect normal  Neck: No JVD, no carotid bruits, no thyromegaly, no lymphadenopathy.  Lungs:Clear bilaterally, no wheezes, rhonci, crackles Cardiovascular: Regular rate and rhythm. No murmurs, gallops or rubs. Abdomen:Soft. Bowel sounds present. Non-tender.  Extremities: No lower extremity edema. Pulses are 2 + in the bilateral DP/PT.   Labs    High Sensitivity Troponin:   Recent Labs  Lab 06/16/19 1620 06/16/19 1949  TROPONINIHS 14 16      Chemistry Recent Labs  Lab 06/19/19 0336 06/19/19 1415 06/19/19 1839 06/19/19 1847 06/19/19 2233 06/20/19 0403 06/20/19 0416  NA 139   < >  140   < > 143 141 137  K 3.6   < > 4.5   < > 3.8 3.5 3.9  CL 106  --  111  --   --   --  105  CO2 24  --  19*  --   --   --  20*  GLUCOSE 123*  --  151*  --   --   --  151*  BUN 8  --  12  --   --   --  12  CREATININE 0.44  --  0.44  --   --   --  0.45  CALCIUM 8.8*  --  7.4*  --   --   --  7.6*  GFRNONAA >60  --  >60  --   --   --  >60  GFRAA >60  --  >60  --   --   --  >60  ANIONGAP 9  --  10  --   --   --  12   < > = values in this interval not displayed.     Hematology Recent Labs  Lab 06/19/19 1435 06/19/19 1728 06/19/19 1839 06/19/19 1847 06/19/19 2233 06/20/19 0403 06/20/19 0416  WBC 18.2*  --  18.9*  --   --   --  13.1*  RBC 3.89  --  3.91  --   --   --  3.44*  HGB 11.9*   < > 12.0   < > 9.5* 9.2* 10.5*  HCT 34.6*   < > 35.3*   < > 28.0* 27.0* 30.9*  MCV 88.9  --   90.3  --   --   --  89.8  MCH 30.6  --  30.7  --   --   --  30.5  MCHC 34.4  --  34.0  --   --   --  34.0  RDW 12.4  --  12.5  --   --   --  12.8  PLT 231  --  253  --   --   --  201   < > = values in this interval not displayed.    BNPNo results for input(s): BNP, PROBNP in the last 168 hours.   DDimer  Recent Labs  Lab 06/16/19 2200  DDIMER 0.81*     Radiology    CARDIAC CATHETERIZATION  Result Date: 06/18/2019  There is mild left ventricular systolic dysfunction. The left ventricular ejection fraction is 35-45% by visual estimate.  LV end diastolic pressure is mildly elevated.  There is mild (2+) mitral regurgitation.  --------------------------------  Prox LAD to Mid LAD lesion is 90% stenosed. Mid LAD lesion is 75% stenosed. Dist LAD lesion is 85% stenosed.  Ramus lesion is 65% stenosed.  Dist RCA lesion is 100% stenosed like via proximal edge of previously placed bare-metal stent  SUMMARY  Severe three-vessel disease: 100% occlusion of small distal RCA proximal to previously placed stent, diffuse 70-90% mid LAD stenosis of wraparound LAD, diffuse 65% proximal large ramus intermedius  Mildly reduced EF with basal inferior hypo-/akinesis and anterolateral hypokinesis.  Borderline elevated LVEDP RECOMMENDATIONS  Sheath will be removed in PACU holding area with manual pressure held for hemostasis.  Transfer to cardiac telemetry unit for post sheath removal care.  CVTS consultation recommended based on three-vessel CAD -> existing disease is not favorable for PCI with long LAD lesion Bryan Lemma, MD  DG Chest Port 1 View  Result Date: 06/20/2019 CLINICAL DATA:  Cardiac surgery. EXAM: PORTABLE CHEST 1 VIEW COMPARISON:  Chest x-ray 06/19/2019. FINDINGS: Interim extubation and removal of NG tube. Right IJ line stable position. Mediastinal drainage catheters noted over the lower chest. No pneumothorax. Prior CABG. Cardiomegaly with pulmonary venous congestion and mild bilateral  interstitial prominence. CHF cannot be excluded. Low lung volumes. Left base infiltrate and small left pleural effusion cannot be excluded. IMPRESSION: 1. Interim extubation removal of NG tube. Right IJ line stable position. Mediastinal drainage catheters noted over the lower chest. No pneumothorax. 2. Prior CABG. Cardiomegaly with mild pulmonary venous congestion and bilateral interstitial prominence. Mild CHF cannot be excluded. 3. Low lung volumes. Left base infiltrate small left pleural effusion cannot be excluded. Electronically Signed   By: Maisie Fus  Register   On: 06/20/2019 06:11   DG Chest Port 1 View  Result Date: 06/19/2019 CLINICAL DATA:  Status post coronary bypass grafting EXAM: PORTABLE CHEST 1 VIEW COMPARISON:  06/16/2019 FINDINGS: Postsurgical changes are noted. Endotracheal tube, gastric catheter and right jugular central line are seen. No pneumothorax is noted. Mediastinal drains are noted. Overall inspiratory effort is poor. Increased density is noted over the left hemithorax likely related to a posteriorly layering effusion. IMPRESSION: Postsurgical changes with tubes and lines as described. Slight increased density left hemithorax likely related to posterior effusion. Electronically Signed   By: Alcide Clever M.D.   On: 06/19/2019 14:12   VAS US DOPPLER PRE CABG  Result Date: 06/19/2019 PREOPERATIVE VASCULAR EVALUATION  Indications: Pre-CABG. Performing Technologist: Gertie Fey MHA, RVT, RDCS, RDMS  Examination Guidelines: A complete evaluation includes B-mode imaging, spectral Doppler, color Doppler, and power Doppler as needed of all accessible portions of each vessel. Bilateral testing is considered an integral part of a complete examination. Limited examinations for reoccurring indications may be performed as noted.  Right Carotid Findings: +----------+-------+-------+--------+---------------------------------+--------+           PSV    EDV    StenosisDescribe                          Comments           cm/s   cm/s                                                     +----------+-------+-------+--------+---------------------------------+--------+ CCA Prox  116    12                                                       +----------+-------+-------+--------+---------------------------------+--------+ CCA Distal59     12             heterogenous and irregular                +----------+-------+-------+--------+---------------------------------+--------+ ICA Prox  51     16             heterogenous, irregular and                                               calcific                                  +----------+-------+-------+--------+---------------------------------+--------+  ICA Distal83     24                                                       +----------+-------+-------+--------+---------------------------------+--------+ ECA       72     7                                                        +----------+-------+-------+--------+---------------------------------+--------+ Portions of this table do not appear on this page. +----------+--------+-------+----------------+------------+           PSV cm/sEDV cmsDescribe        Arm Pressure +----------+--------+-------+----------------+------------+ Subclavian171            Multiphasic, WNL             +----------+--------+-------+----------------+------------+ +---------+--------+--+--------+--+---------+ VertebralPSV cm/s61EDV cm/s19Antegrade +---------+--------+--+--------+--+---------+ Left Carotid Findings: +----------+--------+-------+--------+--------------------------------+--------+           PSV cm/sEDV    StenosisDescribe                        Comments                   cm/s                                                    +----------+--------+-------+--------+--------------------------------+--------+ CCA Prox  107     9                                                        +----------+--------+-------+--------+--------------------------------+--------+ CCA Distal74      19             smooth, heterogenous and                                                  calcific                                 +----------+--------+-------+--------+--------------------------------+--------+ ICA Prox  74      19             irregular and heterogenous               +----------+--------+-------+--------+--------------------------------+--------+ ICA Distal104     35                                                      +----------+--------+-------+--------+--------------------------------+--------+ ECA       90      10                                                      +----------+--------+-------+--------+--------------------------------+--------+ +----------+--------+--------+----------------+------------+  SubclavianPSV cm/sEDV cm/sDescribe        Arm Pressure +----------+--------+--------+----------------+------------+           120             Multiphasic, [id number]          +----------+--------+--------+----------------+------------+ +---------+--------+--+--------+--+---------+ VertebralPSV cm/s61EDV cm/s20Antegrade +---------+--------+--+--------+--+---------+  ABI Findings: +---------+-----------------+-----+-------------------+------------------------+ Right    Rt Pressure      IndexWaveform           Comment                           (mmHg)                                                            +---------+-----------------+-----+-------------------+------------------------+ Brachial                       triphasic          Unable to obtain                                                           pressure due to                                                            catheterization           +---------+-----------------+-----+-------------------+------------------------+ PTA      24               0.15 dampened monophasic                         +---------+-----------------+-----+-------------------+------------------------+ DP       255              1.61 monophasic                                  +---------+-----------------+-----+-------------------+------------------------+ Great Toe198              1.25                                             +---------+-----------------+-----+-------------------+------------------------+ +---------+------------------+-----+-------------------+-------+ Left     Lt Pressure (mmHg)IndexWaveform           Comment +---------+------------------+-----+-------------------+-------+ Brachial 158                    triphasic                  +---------+------------------+-----+-------------------+-------+ PTA      107               0.68 dampened monophasic        +---------+------------------+-----+-------------------+-------+ DP  255               1.61 triphasic                  +---------+------------------+-----+-------------------+-------+ Great Toe84                0.53                            +---------+------------------+-----+-------------------+-------+ +-------+---------------+----------------+ ABI/TBIToday's ABI/TBIPrevious ABI/TBI +-------+---------------+----------------+ Right  0.15                            +-------+---------------+----------------+ Left   0.68                            +-------+---------------+----------------+  Right Doppler Findings: +-----------+--------+-----+----------+----------------------------------------+ Site       PressureIndexDoppler   Comments                                 +-----------+--------+-----+----------+----------------------------------------+ Brachial                triphasic Unable to obtain pressure due to                                            catheterization                          +-----------+--------+-----+----------+----------------------------------------+ Radial                  monophasic                                         +-----------+--------+-----+----------+----------------------------------------+ Ulnar                   triphasic                                          +-----------+--------+-----+----------+----------------------------------------+ Palmar Arch                       Unable to evaluate due to recent                                           catheterization                          +-----------+--------+-----+----------+----------------------------------------+  Left Doppler Findings: +--------+--------+-----+----------+--------+ Site    PressureIndexDoppler   Comments +--------+--------+-----+----------+--------+ [id number]          triphasic          +--------+--------+-----+----------+--------+ Radial               monophasic         +--------+--------+-----+----------+--------+ Ulnar                triphasic          +--------+--------+-----+----------+--------+  Summary: Right Carotid: Velocities in the right ICA are consistent with a 1-39% stenosis. Left Carotid: Velocities in the left ICA are consistent with a 1-39% stenosis. Vertebrals:  Bilateral vertebral arteries demonstrate antegrade flow. Subclavians: Normal flow hemodynamics were seen in bilateral subclavian              arteries. Right ABI: Resting right ankle-brachial index indicates noncompressible right lower extremity arteries. The right toe-brachial index is normal. Left ABI: Resting left ankle-brachial index indicates noncompressible left lower extremity arteries. The left toe-brachial index is abnormal. Left Upper Extremity: Doppler waveforms remain within normal limits with left radial compression. Doppler waveform obliterate with left ulnar compression.  Electronically signed  by Lemar Livings MD on 06/19/2019 at 4:42:44 PM.    Final     Cardiac Studies   Echo: 06/17/19  IMPRESSIONS    1. Left ventricular ejection fraction, by estimation, is 55 to 60%. The  left ventricle has normal function. The left ventricle has no regional  wall motion abnormalities. Left ventricular diastolic function could not  be evaluated.  2. Right ventricular systolic function is normal. The right ventricular  size is normal. Tricuspid regurgitation signal is inadequate for assessing  PA pressure.  3. The mitral valve is degenerative. No evidence of mitral valve  regurgitation. No evidence of mitral stenosis.  4. The aortic valve is tricuspid. Aortic valve regurgitation is not  visualized. Mild aortic valve stenosis.  5. There is mild (Grade II) layered plaque involving the aortic root.   Patient Profile     44 y.o. female  with a hx of DM II on insulin pump, CAD s/p stent 2009, HTN, HLD, h/o DVT, morbid obesity s/p bariatric surgery and PCOS who was admitted with unstable angina and found to have severe three vessel CAD. She underwent 3V CABG on 06/19/19.   Assessment & Plan    1. CAD/Unstable angina: One day post 3V CABG. Stable this am. She is extubated. She is on an ASA, statin and beta blocker.  Appreciate the excellent care of the surgical team.   2. HTN: BP stable, weaning pressors  3. HLD: Reports having issues with higher dose statins in the past. Continue statin for now. May need to consider PCSK9 inh as outpatient.   For questions or updates, please contact [facility] Please consult [email or web address] for contact info under   Signed, Verne Carrow, MD  06/20/2019, 7:33 AM    I have personally seen and examined this patient. I agree with the assessment and plan as outlined above.  Chest pain free today. Troponin negative but given recent symptoms with known CAD, no recent ischemic testing and risk factors, will plan cardiac cath today.   Verne Carrow 06/20/2019 7:33 AM

## 2019-06-20 NOTE — Progress Notes (Signed)
PROGRESS NOTE    Beverly [name]  KVQ:[id number] DOB: [birth year] DOA: 06/16/2019 PCP: Bonnita Nasuti, MD   Brief Narrative: 44 y.o. female with medical history significant forCAD with stent, insulin-dependent diabetes mellitus, hypertension, history of bariatric surgery, and history of lower extremity DVT after surgery no longer anticoagulated, now presenting to emergency department for evaluation of chest pain. The patient reports a few days of waxing and waning chest pain. Symptoms initially felt more like indigestion but she began to experience an ache in her left jaw that she reports to be similar to when she had a heart attack and required stent.   Seen by cardiology underwent cardiac cath with severe three-vessel disease, cardiothoracic surgery consulted for CABG,  Subjective: Seen this morning.  Resting comfortably on nasal cannula oxygen. She has been extubated. Assessment & Plan:  Unstable angina with chest pain/CAD: LHC showed:"Severe three-vessel disease: 100% occlusion of small distal RCA proximal to previously placed stent, diffuse 70-90% mid LAD stenosis of wraparound LAD, diffuse 65% proximal large ramus intermedius" 3/24- pt underwent CABG x3-she has been extubated, cardiology on board continue aspirin, statin, beta-blocker.  HLD-continue increased dose of Lipitor 80 mg.  Could consider PCSK9 as outpatient  Hypertension: BP stable, weaning off pressors   Insulin-requiring or dependent type II diabetes mellitus on insulin pump at home, hemoglobin A1c uncontrolled 10.2,3/23.Patient had previously tried Metformin in the past but had GI upset.  Cardiology recommending Jardiance prior to discharge.  Currently on insulin infusion postop-being switched to levemir and ssi. Continue to monitor and adjust.  Recent Labs  Lab 06/20/19 0700 06/20/19 0856 06/20/19 1104 06/20/19 1226 06/20/19 1326  GLUCAP 135* 166* 200* 168* 183*   Positive D-dimer/history of DVT : CT negative for  PE.    Anemia: Hemoglobin is stable.  Monitor.  Morbid obesity with BMI Body mass index is 47.05 kg/m: Patient will benefit with weight loss and healthy lifestyle.  Of note she has had bariatric surgery in the past.  Leukocytosis. ?  Etiology, appears mostly postop so likely reactive.  Improving already.  No fever.  Recent Labs  Lab 06/18/19 0141 06/19/19 0336 06/19/19 1435 06/19/19 1839 06/20/19 0416  WBC 8.6 7.1 18.2* 18.9* 13.1*   Hypokalemia: improved  Nutrition: Diet Order            Diet Carb Modified Fluid consistency: Thin; Room service appropriate? Yes  Diet effective now              DVT prophylaxis: On Lovenox, held for CABG.   Code Status:full Family Communication: plan of care discussed with nursing staff.  Disposition Plan: Patient is from:home Anticipated Disposition: to TBD Barriers to discharge or conditions that needs to be met prior to discharge: Admitted with chest pain underwent cath and subsequently CABG currently in ICU.  Disposition per primary surgical team.  Hospitalist will continue to follow.  Consultants: CARDIOLOGY, TCTS Procedures: LHC 3/24 SUMMARY  Severe three-vessel disease: 100% occlusion of small distal RCA proximal to previously placed stent, diffuse 70-90% mid LAD stenosis of wraparound LAD, diffuse 65% proximal large ramus intermedius  Mildly reduced EF with basal inferior hypo-/akinesis and anterolateral hypokinesis.  Borderline elevated LVEDP  RECOMMENDATIONS  Sheath will be removed in PACU holding area with manual pressure held for hemostasis.  Transfer to cardiac telemetry unit for post sheath removal care.  CVTS consultation recommended based on three-vessel CAD -> existing disease is not favorable for PCI with long LAD lesion CABG  Microbiology:see note   Medications: Scheduled  Meds: . acetaminophen  1,000 mg Oral Q6H   Or  . acetaminophen (TYLENOL) oral liquid 160 mg/5 mL  1,000 mg Per Tube Q6H  . aspirin  EC  325 mg Oral Daily   Or  . aspirin  324 mg Per Tube Daily  . atorvastatin  80 mg Oral q1800  . bisacodyl  10 mg Oral Daily   Or  . bisacodyl  10 mg Rectal Daily  . carvedilol  6.25 mg Oral BID WC  . Chlorhexidine Gluconate Cloth  6 each Topical Daily  . docusate sodium  200 mg Oral Daily  . enoxaparin (LOVENOX) injection  40 mg Subcutaneous QHS  . furosemide  20 mg Intravenous BID  . gabapentin  300 mg Oral QHS  . insulin aspart  0-24 Units Subcutaneous Q4H  . insulin detemir  25 Units Subcutaneous BID  . [START ON 06/21/2019] pantoprazole  40 mg Oral Daily  . sodium chloride flush  3 mL Intravenous Q12H   Continuous Infusions: . sodium chloride 20 mL/hr at 06/20/19 1300  . sodium chloride    . sodium chloride    . cefUROXime (ZINACEF)  IV Stopped (06/20/19 0446)  . dexmedetomidine (PRECEDEX) IV infusion Stopped (06/19/19 1515)  . insulin 3 mL/hr at 06/20/19 1300  . lactated ringers    . lactated ringers    . lactated ringers 20 mL/hr at 06/20/19 1300  . nitroGLYCERIN Stopped (06/19/19 2141)  . phenylephrine (NEO-SYNEPHRINE) Adult infusion Stopped (06/20/19 0803)    Antimicrobials: Anti-infectives (From admission, onward)   Start     Dose/Rate Route Frequency Ordered Stop   06/19/19 1845  vancomycin (VANCOCIN) IVPB 1000 mg/200 mL premix     1,000 mg 200 mL/hr over 60 Minutes Intravenous  Once 06/19/19 1349 06/19/19 1943   06/19/19 1630  cefUROXime (ZINACEF) 1.5 g in sodium chloride 0.9 % 100 mL IVPB     1.5 g 200 mL/hr over 30 Minutes Intravenous Every 12 hours 06/19/19 1349 06/21/19 1629   06/19/19 0400  vancomycin (VANCOCIN) 1,500 mg in sodium chloride 0.9 % 250 mL IVPB  Status:  Discontinued     1,500 mg 125 mL/hr over 120 Minutes Intravenous To Surgery 06/18/19 1713 06/18/19 1718   06/19/19 0400  cefUROXime (ZINACEF) 1.5 g in sodium chloride 0.9 % 100 mL IVPB  Status:  Discontinued     1.5 g 200 mL/hr over 30 Minutes Intravenous To Surgery 06/18/19 1713 06/18/19  1743   06/19/19 0400  cefUROXime (ZINACEF) 750 mg in sodium chloride 0.9 % 100 mL IVPB  Status:  Discontinued     750 mg 200 mL/hr over 30 Minutes Intravenous To Surgery 06/18/19 1713 06/18/19 1743   06/19/19 0400  vancomycin (VANCOREADY) IVPB 1500 mg/300 mL  Status:  Discontinued     1,500 mg 150 mL/hr over 120 Minutes Intravenous Once 06/18/19 1719 06/18/19 1743   06/19/19 0400  vancomycin (VANCOCIN) 1,500 mg in sodium chloride 0.9 % 250 mL IVPB     1,500 mg 125 mL/hr over 120 Minutes Intravenous To Surgery 06/18/19 2124 06/19/19 0840   06/19/19 0400  cefUROXime (ZINACEF) 1.5 g in sodium chloride 0.9 % 100 mL IVPB     1.5 g 200 mL/hr over 30 Minutes Intravenous To Surgery 06/18/19 2124 06/19/19 0830   06/19/19 0400  cefUROXime (ZINACEF) 750 mg in sodium chloride 0.9 % 100 mL IVPB  Status:  Discontinued     750 mg 200 mL/hr over 30 Minutes Intravenous To Surgery 06/18/19 2117 06/19/19 1349  Objective: Vitals: Today's Vitals   06/20/19 1000 06/20/19 1200 06/20/19 1233 06/20/19 1300  BP: (!) 119/51 123/68  (!) 112/57  Pulse: 85 85  84  Resp: (!) 24 (!) 36  (!) 27  Temp:      TempSrc:      SpO2: 96% 97%  98%  Weight:      Height:      PainSc: Asleep Asleep Asleep     Intake/Output Summary (Last 24 hours) at 06/20/2019 1355 Last data filed at 06/20/2019 1300 Gross per 24 hour  Intake 2519.94 ml  Output 1660 ml  Net 859.94 ml   Filed Weights   06/16/19 2304 06/19/19 0435 06/20/19 0500  Weight: 111.1 kg 109.3 kg 118.8 kg   Weight change: 9.528 kg   Intake/Output from previous day: 03/25 0701 - 03/26 0700 In: 6227 [I.V.:4388.2; Blood:420; IV Piggyback:1418.8] Out: 2400 [Urine:1360; Blood:760; Chest Tube:280] Intake/Output this shift: Total I/O In: 313 [I.V.:204.3; IV Piggyback:108.7] Out: 775 [Urine:705; Chest Tube:70]  Examination:  General exam: Alert oriented not in acute distress, obese.  On nasal cannula.   HEENT:Oral mucosa moist, Ear/Nose WNL  grossly,dentition normal. Respiratory system: bilaterally clear,no wheezing or crackles, cabg site cleandry Cardiovascular system: S1 & S2 +, regular, No JVD. Gastrointestinal system: Abdomen soft, obese,BS sluggish. Nervous System: Alert awake, moving all her extremities, nonfocal.   Extremities: Perfused, distal peripheral pulses palpable.  Skin: No rashes,no icterus. MSK: Normal muscle bulk,tone, power.  Data Reviewed: I have personally reviewed following labs and imaging studies CBC: Recent Labs  Lab 06/18/19 0141 06/18/19 0141 06/19/19 0336 06/19/19 0336 06/19/19 1201 06/19/19 1415 06/19/19 1435 06/19/19 1728 06/19/19 1839 06/19/19 1847 06/19/19 2233 06/20/19 0403 06/20/19 0416  WBC 8.6  --  7.1  --   --   --  18.2*  --  18.9*  --   --   --  13.1*  HGB 13.5   < > 13.1   < > 8.2*   < > 11.9*   < > 12.0 9.9* 9.5* 9.2* 10.5*  HCT 39.0   < > 37.9   < > 23.8*   < > 34.6*   < > 35.3* 29.0* 28.0* 27.0* 30.9*  MCV 87.4  --  87.7  --   --   --  88.9  --  90.3  --   --   --  89.8  PLT 278   < > 272  --  208  --  231  --  253  --   --   --  201   < > = values in this interval not displayed.   Basic Metabolic Panel: Recent Labs  Lab 06/17/19 0500 06/17/19 0500 06/18/19 0141 06/18/19 0141 06/19/19 0336 06/19/19 1415 06/19/19 1839 06/19/19 1847 06/19/19 2233 06/20/19 0403 06/20/19 0416  NA 141   < > 138   < > 139   < > 140 144 143 141 137  K 3.4*   < > 3.9   < > 3.6   < > 4.5 3.9 3.8 3.5 3.9  CL 107  --  105  --  106  --  111  --   --   --  105  CO2 25  --  25  --  24  --  19*  --   --   --  20*  GLUCOSE 173*  --  198*  --  123*  --  151*  --   --   --  151*  BUN  10  --  13  --  8  --  12  --   --   --  12  CREATININE 0.49  --  0.50  --  0.44  --  0.44  --   --   --  0.45  CALCIUM 8.0*  --  8.8*  --  8.8*  --  7.4*  --   --   --  7.6*  MG  --   --   --   --   --   --  3.0*  --   --   --  2.3   < > = values in this interval not displayed.   GFR: Estimated Creatinine  Clearance: 106 mL/min (by C-G formula based on SCr of 0.45 mg/dL). Liver Function Tests: No results for input(s): AST, ALT, ALKPHOS, BILITOT, PROT, ALBUMIN in the last 168 hours. No results for input(s): LIPASE, AMYLASE in the last 168 hours. No results for input(s): AMMONIA in the last 168 hours. Coagulation Profile: Recent Labs  Lab 06/19/19 1435  INR 1.3*   Cardiac Enzymes: No results for input(s): CKTOTAL, CKMB, CKMBINDEX, TROPONINI in the last 168 hours. BNP (last 3 results) No results for input(s): PROBNP in the last 8760 hours. HbA1C: No results for input(s): HGBA1C in the last 72 hours. CBG: Recent Labs  Lab 06/20/19 0700 06/20/19 0856 06/20/19 1104 06/20/19 1226 06/20/19 1326  GLUCAP 135* 166* 200* 168* 183*   Lipid Profile: No results for input(s): CHOL, HDL, LDLCALC, TRIG, CHOLHDL, LDLDIRECT in the last 72 hours. Thyroid Function Tests: No results for input(s): TSH, T4TOTAL, FREET4, T3FREE, THYROIDAB in the last 72 hours. Anemia Panel: No results for input(s): VITAMINB12, FOLATE, FERRITIN, TIBC, IRON, RETICCTPCT in the last 72 hours. Sepsis Labs: No results for input(s): PROCALCITON, LATICACIDVEN in the last 168 hours.  Recent Results (from the past 240 hour(s))  SARS CORONAVIRUS 2 (TAT 6-24 HRS) Nasopharyngeal Nasopharyngeal Swab     Status: None   Collection Time: 06/16/19 11:38 PM   Specimen: Nasopharyngeal Swab  Result Value Ref Range Status   SARS Coronavirus 2 NEGATIVE NEGATIVE Final    Comment: (NOTE) SARS-CoV-2 target nucleic acids are NOT DETECTED. The SARS-CoV-2 RNA is generally detectable in upper and lower respiratory specimens during the acute phase of infection. Negative results do not preclude SARS-CoV-2 infection, do not rule out co-infections with other pathogens, and should not be used as the sole basis for treatment or other patient management decisions. Negative results must be combined with clinical observations, patient history, and  epidemiological information. The expected result is Negative. Fact Sheet for Patients: SugarRoll.be Fact Sheet for Healthcare Providers: https://www.woods-mathews.com/ This test is not yet approved or cleared by the [place] FDA and  has been authorized for detection and/or diagnosis of SARS-CoV-2 by FDA under an Emergency Use Authorization (EUA). This EUA will remain  in effect (meaning this test can be used) for the duration of the COVID-19 declaration under Section 56 4(b)(1) of the Act, 21 U.S.C. section 360bbb-3(b)(1), unless the authorization is terminated or revoked sooner. Performed at [facility], [place] [address], [place], [place] [address]   Surgical pcr screen     Status: None   Collection Time: 06/18/19  3:12 AM   Specimen: Nasal Mucosa; Nasal Swab  Result Value Ref Range Status   MRSA, PCR NEGATIVE NEGATIVE Final   Staphylococcus aureus NEGATIVE NEGATIVE Final    Comment: (NOTE) The Xpert SA Assay (FDA approved for NASAL specimens in patients  73 years of age and older), is one component of a comprehensive surveillance program. It is not intended to diagnose infection nor to guide or monitor treatment. Performed at [facility], [place] [address], [place], [place] [address]       Radiology Studies: DG Chest Port 1 View  Result Date: 06/20/2019 CLINICAL DATA:  Cardiac surgery. EXAM: PORTABLE CHEST 1 VIEW COMPARISON:  Chest x-ray 06/19/2019. FINDINGS: Interim extubation and removal of NG tube. Right IJ line stable position. Mediastinal drainage catheters noted over the lower chest. No pneumothorax. Prior CABG. Cardiomegaly with pulmonary venous congestion and mild bilateral interstitial prominence. CHF cannot be excluded. Low lung volumes. Left base infiltrate and small left pleural effusion cannot be excluded. IMPRESSION: 1. Interim extubation removal of NG tube. Right IJ line stable position. Mediastinal drainage  catheters noted over the lower chest. No pneumothorax. 2. Prior CABG. Cardiomegaly with mild pulmonary venous congestion and bilateral interstitial prominence. Mild CHF cannot be excluded. 3. Low lung volumes. Left base infiltrate small left pleural effusion cannot be excluded. Electronically Signed   By: Marcello Moores  Register   On: 06/20/2019 06:11   DG Chest Port 1 View  Result Date: 06/19/2019 CLINICAL DATA:  Status post coronary bypass grafting EXAM: PORTABLE CHEST 1 VIEW COMPARISON:  06/16/2019 FINDINGS: Postsurgical changes are noted. Endotracheal tube, gastric catheter and right jugular central line are seen. No pneumothorax is noted. Mediastinal drains are noted. Overall inspiratory effort is poor. Increased density is noted over the left hemithorax likely related to a posteriorly layering effusion. IMPRESSION: Postsurgical changes with tubes and lines as described. Slight increased density left hemithorax likely related to posterior effusion. Electronically Signed   By: Inez Catalina M.D.   On: 06/19/2019 14:12   VAS US DOPPLER PRE CABG  Result Date: 06/19/2019 PREOPERATIVE VASCULAR EVALUATION  Indications: Pre-CABG. Performing Technologist: Maudry Mayhew MHA, RVT, RDCS, RDMS  Examination Guidelines: A complete evaluation includes B-mode imaging, spectral Doppler, color Doppler, and power Doppler as needed of all accessible portions of each vessel. Bilateral testing is considered an integral part of a complete examination. Limited examinations for reoccurring indications may be performed as noted.  Right Carotid Findings: +----------+-------+-------+--------+---------------------------------+--------+           PSV    EDV    StenosisDescribe                         Comments           cm/s   cm/s                                                     +----------+-------+-------+--------+---------------------------------+--------+ CCA Prox  116    12                                                        +----------+-------+-------+--------+---------------------------------+--------+ CCA Distal59     12             heterogenous and irregular                +----------+-------+-------+--------+---------------------------------+--------+ ICA Prox  51     16  heterogenous, irregular and                                               calcific                                  +----------+-------+-------+--------+---------------------------------+--------+ ICA Distal83     24                                                       +----------+-------+-------+--------+---------------------------------+--------+ ECA       72     7                                                        +----------+-------+-------+--------+---------------------------------+--------+ Portions of this table do not appear on this page. +----------+--------+-------+----------------+------------+           PSV cm/sEDV cmsDescribe        Arm Pressure +----------+--------+-------+----------------+------------+ Subclavian171            Multiphasic, WNL             +----------+--------+-------+----------------+------------+ +---------+--------+--+--------+--+---------+ VertebralPSV cm/s61EDV cm/s19Antegrade +---------+--------+--+--------+--+---------+ Left Carotid Findings: +----------+--------+-------+--------+--------------------------------+--------+           PSV cm/sEDV    StenosisDescribe                        Comments                   cm/s                                                    +----------+--------+-------+--------+--------------------------------+--------+ CCA Prox  107     9                                                       +----------+--------+-------+--------+--------------------------------+--------+ CCA Distal74      19             smooth, heterogenous and                                                   calcific                                 +----------+--------+-------+--------+--------------------------------+--------+ ICA Prox  74      19             irregular and heterogenous               +----------+--------+-------+--------+--------------------------------+--------+  ICA Distal104     35                                                      +----------+--------+-------+--------+--------------------------------+--------+ ECA       90      10                                                      +----------+--------+-------+--------+--------------------------------+--------+ +----------+--------+--------+----------------+------------+ SubclavianPSV cm/sEDV cm/sDescribe        Arm Pressure +----------+--------+--------+----------------+------------+           120             Multiphasic, [id number]          +----------+--------+--------+----------------+------------+ +---------+--------+--+--------+--+---------+ VertebralPSV cm/s61EDV cm/s20Antegrade +---------+--------+--+--------+--+---------+  ABI Findings: +---------+-----------------+-----+-------------------+------------------------+ Right    Rt Pressure      IndexWaveform           Comment                           (mmHg)                                                            +---------+-----------------+-----+-------------------+------------------------+ Brachial                       triphasic          Unable to obtain                                                           pressure due to                                                            catheterization          +---------+-----------------+-----+-------------------+------------------------+ PTA      24               0.15 dampened monophasic                         +---------+-----------------+-----+-------------------+------------------------+ DP       255              1.61 monophasic                                   +---------+-----------------+-----+-------------------+------------------------+ Great Toe198              1.25                                             +---------+-----------------+-----+-------------------+------------------------+ +---------+------------------+-----+-------------------+-------+  Left     Lt Pressure (mmHg)IndexWaveform           Comment +---------+------------------+-----+-------------------+-------+ Brachial 158                    triphasic                  +---------+------------------+-----+-------------------+-------+ PTA      107               0.68 dampened monophasic        +---------+------------------+-----+-------------------+-------+ DP       255               1.61 triphasic                  +---------+------------------+-----+-------------------+-------+ Great Toe84                0.53                            +---------+------------------+-----+-------------------+-------+ +-------+---------------+----------------+ ABI/TBIToday's ABI/TBIPrevious ABI/TBI +-------+---------------+----------------+ Right  0.15                            +-------+---------------+----------------+ Left   0.68                            +-------+---------------+----------------+  Right Doppler Findings: +-----------+--------+-----+----------+----------------------------------------+ Site       PressureIndexDoppler   Comments                                 +-----------+--------+-----+----------+----------------------------------------+ Brachial                triphasic Unable to obtain pressure due to                                           catheterization                          +-----------+--------+-----+----------+----------------------------------------+ Radial                  monophasic                                         +-----------+--------+-----+----------+----------------------------------------+ Ulnar                    triphasic                                          +-----------+--------+-----+----------+----------------------------------------+ Palmar Arch                       Unable to evaluate due to recent                                           catheterization                          +-----------+--------+-----+----------+----------------------------------------+  Left Doppler Findings: +--------+--------+-----+----------+--------+ Site    PressureIndexDoppler   Comments +--------+--------+-----+----------+--------+ [id number]          triphasic          +--------+--------+-----+----------+--------+ Radial               monophasic         +--------+--------+-----+----------+--------+ Ulnar                triphasic          +--------+--------+-----+----------+--------+  Summary: Right Carotid: Velocities in the right ICA are consistent with a 1-39% stenosis. Left Carotid: Velocities in the left ICA are consistent with a 1-39% stenosis. Vertebrals:  Bilateral vertebral arteries demonstrate antegrade flow. Subclavians: Normal flow hemodynamics were seen in bilateral subclavian              arteries. Right ABI: Resting right ankle-brachial index indicates noncompressible right lower extremity arteries. The right toe-brachial index is normal. Left ABI: Resting left ankle-brachial index indicates noncompressible left lower extremity arteries. The left toe-brachial index is abnormal. Left Upper Extremity: Doppler waveforms remain within normal limits with left radial compression. Doppler waveform obliterate with left ulnar compression.  Electronically signed by Servando Snare MD on 06/19/2019 at 4:42:44 PM.    Final      LOS: 2 days   Time spent: More than 50% of that time was spent in counseling and/or coordination of care.  Antonieta Pert, MD [facility]  06/20/2019, 1:55 PM

## 2019-06-20 NOTE — Progress Notes (Signed)
TCTS BRIEF SICU PROGRESS NOTE  1 Day Post-Op  S/P Procedure(s) (LRB): CORONARY ARTERY BYPASS GRAFTING (CABG) x 3 WITH ENDOSCOPIC HARVESTING OF LEFT GREATER SAPHENOUS VEIN. LIMA TO LAD, SVG TO PD, SVG TO RAMUS (N/A) TRANSESOPHAGEAL ECHOCARDIOGRAM (TEE) (N/A) Application Of Wound Vac USING PREVENA INCISIONAL DRESSING (N/A)   Stable day NSR w/ stable BP Breathing comfortably w/ O2 sats 96% UOP adequate Labs okay  Plan: Continue current plan  Purcell Nails, MD 06/20/2019 5:06 PM

## 2019-06-20 NOTE — Discharge Instructions (Signed)

## 2019-06-20 NOTE — Progress Notes (Signed)
Inpatient Diabetes Program Recommendations  AACE/ADA: New Consensus Statement on Inpatient Glycemic Control (2015)  Target Ranges:  Prepandial:   less than 140 mg/dL      Peak postprandial:   less than 180 mg/dL (1-2 hours)      Critically ill patients:  140 - 180 mg/dL   Results for Ninh, Mirinda T (MRN [id number]) as of 06/20/2019 15:05  Ref. Range 06/20/2019 07:00 06/20/2019 08:56 06/20/2019 11:04 06/20/2019 12:26 06/20/2019 13:26  Glucose-Capillary Latest Ref Range: 70 - 99 mg/dL 135 (H)  IV Insulin Drip 166 (H)  IV Insulin Drip 200 (H)  IV Insulin Drip +  25 units LEVEMIR 168 (H)  IV Insulin Drip 183 (H)  IV Insulin Drip Stopped +  4 units NOVOLOG     Home DM Meds: Insulin Pump  Current Orders: Levemir 25 units BID      Novolog 0-24 units Q4 hours    Met w/ pt at bedside today (husband at bedside as well assisting pt to eat lunch).  Pt alert and able to tell me she sent her insulin pump home with her husband.  Husband told me he can bring the pump and supplies back when needed.  Discussed with pt that while she is still early in her post-op recovery period it is probably best (and easier) for pt to allow us to give her insulin injections, however, once pt more mobile and feeling better, the MDs may allow pt to restart her home insulin pump.  Pt open and agreeable to this.    MD- While patient in the early recovery stage, may be best to continue giving her insulin SQ with injections given by the RN staff.  Once patient more mobile and feeling better, decision can be made to have pt restart her insulin pump.  Husband can bring pump and supplies when ready.  May want to wait until Monday when the Diabetes Coordinator is present on campus to convert back to insulin pump.     --Will follow patient during hospitalization--  Jeannine Johnston Fishel RN, MSN, CDE Diabetes Coordinator Inpatient Glycemic Control Team Team Pager: [phone] (8a-5p)  

## 2019-06-20 NOTE — Progress Notes (Signed)
1 Day Post-Op Procedure(s) (LRB): CORONARY ARTERY BYPASS GRAFTING (CABG) x 3 WITH ENDOSCOPIC HARVESTING OF LEFT GREATER SAPHENOUS VEIN. LIMA TO LAD, SVG TO PD, SVG TO RAMUS (N/A) TRANSESOPHAGEAL ECHOCARDIOGRAM (TEE) (N/A) Application Of Wound Vac USING PREVENA INCISIONAL DRESSING (N/A) Subjective: C/o pain when moving around, some belching but no nausea  Objective: Vital signs in last 24 hours: Temp:  [97.5 F (36.4 C)-98.4 F (36.9 C)] 98.4 F (36.9 C) (03/26 0400) Pulse Rate:  [80-92] 87 (03/26 0700) Cardiac Rhythm: Normal sinus rhythm (03/26 0400) Resp:  [1-46] 27 (03/26 0700) BP: (93-120)/(56-68) 119/57 (03/26 0700) SpO2:  [93 %-100 %] 95 % (03/26 0700) Arterial Line BP: (72-274)/(44-86) 133/58 (03/26 0700) FiO2 (%):  [40 %-50 %] 40 % (03/25 1656) Weight:  [118.8 kg] 118.8 kg (03/26 0500)  Hemodynamic parameters for last 24 hours: CVP:  [5 mmHg-27 mmHg] 6 mmHg  Intake/Output from previous day: 03/25 0701 - 03/26 0700 In: 6227 [I.V.:4388.2; Blood:420; IV Piggyback:1418.8] Out: 2400 [Urine:1360; Blood:760; Chest Tube:280] Intake/Output this shift: No intake/output data recorded.  General appearance: alert, cooperative and no distress Neurologic: intact Heart: regular rate and rhythm Lungs: diminished breath sounds bibasilar Abdomen: normal findings: soft, non-tender  Lab Results: Recent Labs    06/19/19 1839 06/19/19 1847 06/20/19 0403 06/20/19 0416  WBC 18.9*  --   --  13.1*  HGB 12.0   < > 9.2* 10.5*  HCT 35.3*   < > 27.0* 30.9*  PLT 253  --   --  201   < > = values in this interval not displayed.   BMET:  Recent Labs    06/19/19 1839 06/19/19 1847 06/20/19 0403 06/20/19 0416  NA 140   < > 141 137  K 4.5   < > 3.5 3.9  CL 111  --   --  105  CO2 19*  --   --  20*  GLUCOSE 151*  --   --  151*  BUN 12  --   --  12  CREATININE 0.44  --   --  0.45  CALCIUM 7.4*  --   --  7.6*   < > = values in this interval not displayed.    PT/INR:  Recent Labs   06/19/19 1435  LABPROT 16.0*  INR 1.3*   ABG    Component Value Date/Time   PHART 7.368 06/20/2019 0403   HCO3 19.6 (L) 06/20/2019 0403   TCO2 21 (L) 06/20/2019 0403   ACIDBASEDEF 5.0 (H) 06/20/2019 0403   O2SAT 95.0 06/20/2019 0403   CBG (last 3)  Recent Labs    06/20/19 0502 06/20/19 0612 06/20/19 0700  GLUCAP 136* 136* 135*    Assessment/Plan: S/P Procedure(s) (LRB): CORONARY ARTERY BYPASS GRAFTING (CABG) x 3 WITH ENDOSCOPIC HARVESTING OF LEFT GREATER SAPHENOUS VEIN. LIMA TO LAD, SVG TO PD, SVG TO RAMUS (N/A) TRANSESOPHAGEAL ECHOCARDIOGRAM (TEE) (N/A) Application Of Wound Vac USING PREVENA INCISIONAL DRESSING (N/A) POD # 1  CV- good hemodynamics- dc A line  Coreg, ASA, lipitor, resume cozaar prior to DC RESP- IS for basilar atelectasis RENAL- creatinine stable, volume overloaded- will diurese ENDO- CBG mildly elevated on insulin drip- transition to levemir + SSI Anemia secondary to ABL- mild, follow SCD + enoxaparin + ambulation for DVT prophylaxis Cardiac rehab   LOS: 2 days    [name] 06/20/2019

## 2019-06-21 ENCOUNTER — Inpatient Hospital Stay (HOSPITAL_COMMUNITY): Payer: 59

## 2019-06-21 LAB — BASIC METABOLIC PANEL
Anion gap: 9 (ref 5–15)
BUN: 11 mg/dL (ref 6–20)
CO2: 24 mmol/L (ref 22–32)
Calcium: 7.8 mg/dL — ABNORMAL LOW (ref 8.9–10.3)
Chloride: 105 mmol/L (ref 98–111)
Creatinine, Ser: 0.47 mg/dL (ref 0.44–1.00)
GFR calc Af Amer: >60 mL/min (ref >60)
GFR calc non Af Amer: >60 mL/min (ref >60)
Glucose, Bld: 173 mg/dL — ABNORMAL HIGH (ref 70–99)
Potassium: 4.3 mmol/L (ref 3.5–5.1)
Sodium: 138 mmol/L (ref 135–145)

## 2019-06-21 LAB — CBC
HCT: 29.9 % — ABNORMAL LOW (ref 36.0–46.0)
Hemoglobin: 9.9 g/dL — ABNORMAL LOW (ref 12.0–15.0)
MCH: 30.2 pg (ref 26.0–34.0)
MCHC: 33.1 g/dL (ref 30.0–36.0)
MCV: 91.2 fL (ref 80.0–100.0)
Platelets: 183 10*3/uL (ref 150–400)
RBC: 3.28 MIL/uL — ABNORMAL LOW (ref 3.87–5.11)
RDW: 13.1 % (ref 11.5–15.5)
WBC: 13 10*3/uL — ABNORMAL HIGH (ref 4.0–10.5)
nRBC: 0 % (ref 0.0–0.2)

## 2019-06-21 LAB — GLUCOSE, CAPILLARY
Glucose-Capillary: 164 mg/dL — ABNORMAL HIGH (ref 70–99)
Glucose-Capillary: 189 mg/dL — ABNORMAL HIGH (ref 70–99)
Glucose-Capillary: 195 mg/dL — ABNORMAL HIGH (ref 70–99)
Glucose-Capillary: 201 mg/dL — ABNORMAL HIGH (ref 70–99)
Glucose-Capillary: 193 mg/dL — ABNORMAL HIGH (ref 70–99)

## 2019-06-21 MED ORDER — INSULIN ASPART 100 UNIT/ML ~~LOC~~ SOLN
0.0000 [IU] | Freq: Three times a day (TID) | SUBCUTANEOUS | Status: DC
  Administered 2019-06-21: 8 [IU] via SUBCUTANEOUS
  Administered 2019-06-21 – 2019-06-22 (×4): 4 [IU] via SUBCUTANEOUS
  Administered 2019-06-22: 12 [IU] via SUBCUTANEOUS
  Administered 2019-06-23: 2 [IU] via SUBCUTANEOUS
  Administered 2019-06-23: 8 [IU] via SUBCUTANEOUS
  Administered 2019-06-23 – 2019-06-24 (×2): 2 [IU] via SUBCUTANEOUS

## 2019-06-21 MED ORDER — CARVEDILOL 12.5 MG PO TABS
12.5000 mg | ORAL_TABLET | Freq: Two times a day (BID) | ORAL | Status: DC
  Administered 2019-06-21 – 2019-06-24 (×6): 12.5 mg via ORAL
  Filled 2019-06-21 (×6): qty 1

## 2019-06-21 MED ORDER — ~~LOC~~ CARDIAC SURGERY, PATIENT & FAMILY EDUCATION: Freq: Once | Status: AC

## 2019-06-21 MED ORDER — POTASSIUM CHLORIDE CRYS ER 20 MEQ PO TBCR
20.0000 meq | EXTENDED_RELEASE_TABLET | Freq: Two times a day (BID) | ORAL | Status: AC
  Administered 2019-06-21 – 2019-06-22 (×4): 20 meq via ORAL
  Filled 2019-06-21 (×4): qty 1

## 2019-06-21 MED ORDER — INSULIN DETEMIR 100 UNIT/ML ~~LOC~~ SOLN
30.0000 [IU] | Freq: Two times a day (BID) | SUBCUTANEOUS | Status: DC
  Administered 2019-06-21: 21:00:00 30 [IU] via SUBCUTANEOUS
  Filled 2019-06-21 (×4): qty 0.3

## 2019-06-21 NOTE — Progress Notes (Addendum)
      [address]       [name] [address]             [phone]        CARDIOTHORACIC SURGERY PROGRESS NOTE   R2 Days Post-Op Procedure(s) (LRB): CORONARY ARTERY BYPASS GRAFTING (CABG) x 3 WITH ENDOSCOPIC HARVESTING OF LEFT GREATER SAPHENOUS VEIN. LIMA TO LAD, SVG TO PD, SVG TO RAMUS (N/A) TRANSESOPHAGEAL ECHOCARDIOGRAM (TEE) (N/A) Application Of Wound Vac USING PREVENA INCISIONAL DRESSING (N/A)  Subjective: Looks good.  Had a good night.  Mild soreness, otherwise no complaints  Objective: Vital signs: BP Readings from Last 1 Encounters:  06/21/19 140/73   Pulse Readings from Last 1 Encounters:  06/21/19 95   Resp Readings from Last 1 Encounters:  06/21/19 (!) 29   Temp Readings from Last 1 Encounters:  06/21/19 98.1 F (36.7 C) (Oral)    Hemodynamics: CVP:  [6 mmHg-10 mmHg] 10 mmHg  Physical Exam:  Rhythm:   sinus  Breath sounds: Diminished at bases  Heart sounds:  RRR  Incisions:  Dressing dry, intact  Abdomen:  Soft, non-distended, non-tender  Extremities:  Warm, well-perfused    Intake/Output from previous day: 03/26 0701 - 03/27 0700 In: 1042.3 [I.V.:733.7; IV Piggyback:308.6] Out: 1540 [Urine:1470; Chest Tube:70] Intake/Output this shift: No intake/output data recorded.  Lab Results:  CBC: Recent Labs    06/20/19 1644 06/21/19 0402  WBC 13.3* 13.0*  HGB 10.4* 9.9*  HCT 31.2* 29.9*  PLT 189 183    BMET:  Recent Labs    06/20/19 1644 06/21/19 0402  NA 133* 138  K 3.7 4.3  CL 102 105  CO2 22 24  GLUCOSE 191* 173*  BUN 11 11  CREATININE 0.52 0.47  CALCIUM 7.4* 7.8*     PT/INR:   Recent Labs    06/19/19 1435  LABPROT 16.0*  INR 1.3*    CBG (last 3)  Recent Labs    06/20/19 2332 06/21/19 0336 06/21/19 0807  GLUCAP 193* 164* 189*    ABG    Component Value Date/Time   PHART 7.368 06/20/2019 0403   PCO2ART 34.0 06/20/2019 0403   PO2ART 75.0 (L) 06/20/2019 0403   HCO3 19.6 (L) 06/20/2019 0403   TCO2 21 (L) 06/20/2019 0403   ACIDBASEDEF 5.0 (H) 06/20/2019 0403   O2SAT 95.0 06/20/2019 0403    CXR: Low lung volumes, bibasilar ATX L>R, otherwise clear  Assessment/Plan: S/P Procedure(s) (LRB): CORONARY ARTERY BYPASS GRAFTING (CABG) x 3 WITH ENDOSCOPIC HARVESTING OF LEFT GREATER SAPHENOUS VEIN. LIMA TO LAD, SVG TO PD, SVG TO RAMUS (N/A) TRANSESOPHAGEAL ECHOCARDIOGRAM (TEE) (N/A) Application Of Wound Vac USING PREVENA INCISIONAL DRESSING (N/A)  Doing well POD2   Mobilize  Diuresis  Increase beta blocker to preop dose  Consider resuming ARB soon depending on BP  Change CBGs/SSI to ac/hs and increase levemir  Routine care  Transfer 4E   Purcell Nails, MD 06/21/2019 8:49 AM

## 2019-06-21 NOTE — Progress Notes (Signed)
Patient seen and examined. Remains in ICU  Reports feeling well mild soreness on the chest, able to move all her extremities, Swelling present in upper extremities She is status post CABG-, off pressors.  On aspirin 325, Lipitor, Coreg, cardio following. Wound VAC applied to the chest surgical site. Blood sugar fairly stable on Levemir 30 units twice daily and sliding scale, normally on insulin pump, followed by diabetic coordinator hopefully transition to insulin pump Monday Will cont to peripherally monitor. Bun/creat stable, wbc 13.0 Improving,afebrile. Hb 9.9. monitor. Encourage incentive spirometry CXR this am IMPRESSION: Status post CABG. Left basilar atelectasis. Right jugular sheath with the tip projecting over the cavoatrial junction

## 2019-06-21 NOTE — Progress Notes (Signed)
CARDIAC REHAB PHASE I   PRE:  Rate/Rhythm: 98 SR    BP: sitting 136/77    SaO2: 100 2L, 100 RA  MODE:  Ambulation: 270 ft   POST:  Rate/Rhythm: 125 ST    BP: sitting 143/75     SaO2: 100 RA  Pt able to stand independently. To [facility] but needed assist for cleaning. Stood and used RW for ambulation. Steady, slow pace. HR and respirations slowly up with slow pace. To w/c after walk for tx to 4e. She struggles to sit back deep due to height and abdomen. Mod-max assist to lift second leg onto w/c. Pt tired but tolerated well.  [id number]  Harriet Masson CES, ACSM 06/21/2019 3:09 PM

## 2019-06-21 NOTE — Progress Notes (Signed)
  Pt received to room, tele applied, vitals taken.  Pt alert and oriented x 4, requesting PRN pain meds after walking with cardiac rehab prior to transfer.  Will cont plan of care.

## 2019-06-22 ENCOUNTER — Encounter: Payer: Self-pay | Admitting: *Deleted

## 2019-06-22 ENCOUNTER — Inpatient Hospital Stay (HOSPITAL_COMMUNITY): Payer: 59

## 2019-06-22 LAB — GLUCOSE, CAPILLARY
Glucose-Capillary: 161 mg/dL — ABNORMAL HIGH (ref 70–99)
Glucose-Capillary: 138 mg/dL — ABNORMAL HIGH (ref 70–99)
Glucose-Capillary: 176 mg/dL — ABNORMAL HIGH (ref 70–99)
Glucose-Capillary: 191 mg/dL — ABNORMAL HIGH (ref 70–99)
Glucose-Capillary: 280 mg/dL — ABNORMAL HIGH (ref 70–99)

## 2019-06-22 LAB — BPAM RBC
Blood Product Expiration Date: 202104162359
Blood Product Expiration Date: 202104162359
ISSUE DATE / TIME: 202103250820
ISSUE DATE / TIME: 202103250820
Unit Type and Rh: 9500
Unit Type and Rh: 9500

## 2019-06-22 LAB — TYPE AND SCREEN
ABO/RH(D): O NEG
Antibody Screen: NEGATIVE
Unit division: 0
Unit division: 0

## 2019-06-22 LAB — BASIC METABOLIC PANEL
Anion gap: 10 (ref 5–15)
BUN: 11 mg/dL (ref 6–20)
CO2: 23 mmol/L (ref 22–32)
Calcium: 8.5 mg/dL — ABNORMAL LOW (ref 8.9–10.3)
Chloride: 107 mmol/L (ref 98–111)
Creatinine, Ser: 0.44 mg/dL (ref 0.44–1.00)
GFR calc Af Amer: >60 mL/min (ref >60)
GFR calc non Af Amer: >60 mL/min (ref >60)
Glucose, Bld: 159 mg/dL — ABNORMAL HIGH (ref 70–99)
Potassium: 3.7 mmol/L (ref 3.5–5.1)
Sodium: 140 mmol/L (ref 135–145)

## 2019-06-22 LAB — CBC
HCT: 30.8 % — ABNORMAL LOW (ref 36.0–46.0)
Hemoglobin: 10.5 g/dL — ABNORMAL LOW (ref 12.0–15.0)
MCH: 30.3 pg (ref 26.0–34.0)
MCHC: 34.1 g/dL (ref 30.0–36.0)
MCV: 89 fL (ref 80.0–100.0)
Platelets: 231 10*3/uL (ref 150–400)
RBC: 3.46 MIL/uL — ABNORMAL LOW (ref 3.87–5.11)
RDW: 13.2 % (ref 11.5–15.5)
WBC: 12 10*3/uL — ABNORMAL HIGH (ref 4.0–10.5)
nRBC: 0 % (ref 0.0–0.2)

## 2019-06-22 MED ORDER — LOSARTAN POTASSIUM 50 MG PO TABS
50.0000 mg | ORAL_TABLET | Freq: Every day | ORAL | Status: DC
  Administered 2019-06-22 – 2019-06-24 (×3): 50 mg via ORAL
  Filled 2019-06-22 (×3): qty 1

## 2019-06-22 MED ORDER — INSULIN DETEMIR 100 UNIT/ML ~~LOC~~ SOLN
35.0000 [IU] | Freq: Two times a day (BID) | SUBCUTANEOUS | Status: DC
  Administered 2019-06-22 – 2019-06-23 (×4): 35 [IU] via SUBCUTANEOUS
  Filled 2019-06-22 (×6): qty 0.35

## 2019-06-22 NOTE — Progress Notes (Addendum)
      [address]       [place] [address]             [phone]      3 Days Post-Op Procedure(s) (LRB): CORONARY ARTERY BYPASS GRAFTING (CABG) x 3 WITH ENDOSCOPIC HARVESTING OF LEFT GREATER SAPHENOUS VEIN. LIMA TO LAD, SVG TO PD, SVG TO RAMUS (N/A) TRANSESOPHAGEAL ECHOCARDIOGRAM (TEE) (N/A) Application Of Wound Vac USING PREVENA INCISIONAL DRESSING (N/A) Subjective: Feels okay this morning.   Objective: Vital signs in last 24 hours: Temp:  [98 F (36.7 C)-98.7 F (37.1 C)] 98 F (36.7 C) (03/28 0443) Pulse Rate:  [83-102] 100 (03/28 0443) Cardiac Rhythm: Normal sinus rhythm (03/28 0700) Resp:  [19-32] 19 (03/28 0443) BP: (123-154)/(46-86) 154/86 (03/28 0443) SpO2:  [93 %-100 %] 93 % (03/28 0443) Weight:  [114.9 kg] 114.9 kg (03/28 0443)    Intake/Output from previous day: 03/27 0701 - 03/28 0700 In: 240 [P.O.:240] Out: 350 [Urine:350] Intake/Output this shift: No intake/output data recorded.  General appearance: alert, cooperative and no distress Heart: regular rate and rhythm, S1, S2 normal, no murmur, click, rub or gallop Lungs: clear to auscultation bilaterally Abdomen: soft, non-tender; bowel sounds normal; no masses,  no organomegaly Extremities: extremities normal, atraumatic, no cyanosis or edema Wound: clean and dry  Lab Results: Recent Labs    06/21/19 0402 06/22/19 0409  WBC 13.0* 12.0*  HGB 9.9* 10.5*  HCT 29.9* 30.8*  PLT 183 231   BMET:  Recent Labs    06/21/19 0402 06/22/19 0409  NA 138 140  K 4.3 3.7  CL 105 107  CO2 24 23  GLUCOSE 173* 159*  BUN 11 11  CREATININE 0.47 0.44  CALCIUM 7.8* 8.5*    PT/INR:  Recent Labs    06/19/19 1435  LABPROT 16.0*  INR 1.3*   ABG    Component Value Date/Time   PHART 7.368 06/20/2019 0403   HCO3 19.6 (L) 06/20/2019 0403   TCO2 21 (L) 06/20/2019 0403   ACIDBASEDEF 5.0 (H) 06/20/2019 0403   O2SAT 95.0 06/20/2019 0403   CBG (last 3)  Recent Labs    06/21/19 1716  06/21/19 2103 06/22/19 0605  GLUCAP 201* 193* 161*    Assessment/Plan: S/P Procedure(s) (LRB): CORONARY ARTERY BYPASS GRAFTING (CABG) x 3 WITH ENDOSCOPIC HARVESTING OF LEFT GREATER SAPHENOUS VEIN. LIMA TO LAD, SVG TO PD, SVG TO RAMUS (N/A) TRANSESOPHAGEAL ECHOCARDIOGRAM (TEE) (N/A) Application Of Wound Vac USING PREVENA INCISIONAL DRESSING (N/A)  1. CV-BP climbing-may need an additional agent. NSR int he 80s to ST in the low 100s. Continue asa, statin, and coreg.  2. Pulm- tolerating room air with good oxygen saturation. CXR in progress. Encouraged incentive spirometer.  3. Renal-creatinine 0.44, electrolytes okay.  Continue lasix 20mg  BID 4. H and H 10.5/30.8, expected acute blood loss anemia, stable 5. Endo-Will increase Levemir to 35 units BID.  6. Pain is well controlled  Plan: Ambulate TID. Continue to use incentive spirometer. Making good progress. Will arrage help at home to take care of her daughter while she is healing.    LOS: 4 days    12-18-1995 06/22/2019   I have seen and examined the patient and agree with the assessment and plan as outlined.  Restart losartan 1/2 preop dose.  06/24/2019, MD 06/22/2019 1:39 PM

## 2019-06-22 NOTE — Progress Notes (Signed)
Notified PA Jari Favre of critical troponin.  Expected finding.  Will con't plan of care.

## 2019-06-22 NOTE — Progress Notes (Signed)
PROGRESS NOTE    Beverly [name]  QTM:[id number] DOB: 1975-11-27 DOA: 06/16/2019 PCP: Bonnita Nasuti, MD   Brief Narrative: 44 y.o. female with medical history significant forCAD with stent, insulin-dependent diabetes mellitus, hypertension, history of bariatric surgery, and history of lower extremity DVT after surgery no longer anticoagulated, now presenting to emergency department for evaluation of chest pain. The patient reports a few days of waxing and waning chest pain. Symptoms initially felt more like indigestion but she began to experience an ache in her left jaw that she reports to be similar to when she had a heart attack and required stent.   Seen by cardiology underwent cardiac cath with severe three-vessel disease, cardiothoracic surgery consulted for CABG. underwent CABG and was intubated postop and remained in ICU under CT surgery service, patient transferred out of ICU 3/27  Subjective: Seen this morning.  On the bedside chair.  No chest pain nausea vomiting.    Assessment & Plan: Unstable angina with chest pain/CAD: LHC showed:"Severe three-vessel disease: 100% occlusion of small distal RCA proximal to previously placed stent, diffuse 70-90% mid LAD stenosis of wraparound LAD, diffuse 65% proximal large ramus intermedius" 3/24- pt underwent CABG x3 and psot op- doing well. Cont aspirin, statin, beta-blocker.  Add losartan today as blood pressure is slowly increasing.  Continue Lasix 20 mg twice daily.  Monitor.  HLD-continue increased dose of Lipitor 80 mg.  Could consider PCSK9 as outpatient  Hypertension: BP uptrending, resuming losartan, continue carvedilol 12.5 mg twice daily.    Insulin-requiring or dependent type II diabetes mellitus on insulin pump at home, hemoglobin A1c uncontrolled 10.2,3/23.Patient had previously tried Metformin in the past but had GI upset.  Cardiology recommending Jardiance prior to discharge.  Currently on insulin infusion at home-continue detemir  35 units twice daily, QA CHS sliding scale and hopefully transition to her home insulin infusion tomorrow.  Diabetes coordinator following.  Monitor blood sugar.   Recent Labs  Lab 06/21/19 1716 06/21/19 2103 06/22/19 0605 06/22/19 0825 06/22/19 1157  GLUCAP 201* 193* 161* 138* 176*   Positive D-dimer/history of DVT : CT negative for PE.    Anemia: Hemoglobin is stable.  Monitor.  Morbid obesity with BMI Body mass index is 47.05 kg/m: Patient will benefit with weight loss and healthy lifestyle.  Of note she has had bariatric surgery in the past.  Leukocytosis. ?  Etiology, appears mostly postop so likely reactive.  Improving  Recent Labs  Lab 06/19/19 1839 06/20/19 0416 06/20/19 1644 06/21/19 0402 06/22/19 0409  WBC 18.9* 13.1* 13.3* 13.0* 12.0*   Hypokalemia: improved  Nutrition: Diet Order            Diet Carb Modified Fluid consistency: Thin; Room service appropriate? Yes  Diet effective now              DVT prophylaxis: cont Lovenox Code Status:full Family Communication: plan of care discussed with patient Disposition Plan: Patient is from:home Anticipated Disposition: to TBD Barriers to discharge or conditions that needs to be met prior to discharge: Admitted with chest pain status post cath with multivessel CAD underwent CABG, progressing and disposition home once okay with CT surgery team.Encourage ambulation.  Consultants: CARDIOLOGY, TCTS Procedures: LHC 3/24 SUMMARY  Severe three-vessel disease: 100% occlusion of small distal RCA proximal to previously placed stent, diffuse 70-90% mid LAD stenosis of wraparound LAD, diffuse 65% proximal large ramus intermedius  Mildly reduced EF with basal inferior hypo-/akinesis and anterolateral hypokinesis.  Borderline elevated LVEDP  RECOMMENDATIONS  Sheath  will be removed in PACU holding area with manual pressure held for hemostasis.  Transfer to cardiac telemetry unit for post sheath removal care.  CVTS  consultation recommended based on three-vessel CAD -> existing disease is not favorable for PCI with long LAD lesion CABG  Microbiology:see note   Medications: Scheduled Meds:  acetaminophen  1,000 mg Oral Q6H   aspirin EC  325 mg Oral Daily   atorvastatin  80 mg Oral q1800   bisacodyl  10 mg Oral Daily   Or   bisacodyl  10 mg Rectal Daily   carvedilol  12.5 mg Oral BID WC   Chlorhexidine Gluconate Cloth  6 each Topical Daily   docusate sodium  200 mg Oral Daily   enoxaparin (LOVENOX) injection  40 mg Subcutaneous QHS   furosemide  20 mg Intravenous BID   gabapentin  300 mg Oral QHS   insulin aspart  0-24 Units Subcutaneous TID AC & HS   insulin detemir  35 Units Subcutaneous BID   losartan  50 mg Oral Daily   pantoprazole  40 mg Oral Daily   potassium chloride  20 mEq Oral BID   sodium chloride flush  3 mL Intravenous Q12H   Continuous Infusions:  sodium chloride     lactated ringers      Antimicrobials: Anti-infectives (From admission, onward)   Start     Dose/Rate Route Frequency Ordered Stop   06/19/19 1845  vancomycin (VANCOCIN) IVPB 1000 mg/200 mL premix     1,000 mg 200 mL/hr over 60 Minutes Intravenous  Once 06/19/19 1349 06/19/19 1943   06/19/19 1630  cefUROXime (ZINACEF) 1.5 g in sodium chloride 0.9 % 100 mL IVPB     1.5 g 200 mL/hr over 30 Minutes Intravenous Every 12 hours 06/19/19 1349 06/21/19 0430   06/19/19 0400  vancomycin (VANCOCIN) 1,500 mg in sodium chloride 0.9 % 250 mL IVPB  Status:  Discontinued     1,500 mg 125 mL/hr over 120 Minutes Intravenous To Surgery 06/18/19 1713 06/18/19 1718   06/19/19 0400  cefUROXime (ZINACEF) 1.5 g in sodium chloride 0.9 % 100 mL IVPB  Status:  Discontinued     1.5 g 200 mL/hr over 30 Minutes Intravenous To Surgery 06/18/19 1713 06/18/19 1743   06/19/19 0400  cefUROXime (ZINACEF) 750 mg in sodium chloride 0.9 % 100 mL IVPB  Status:  Discontinued     750 mg 200 mL/hr over 30 Minutes Intravenous To  Surgery 06/18/19 1713 06/18/19 1743   06/19/19 0400  vancomycin (VANCOREADY) IVPB 1500 mg/300 mL  Status:  Discontinued     1,500 mg 150 mL/hr over 120 Minutes Intravenous Once 06/18/19 1719 06/18/19 1743   06/19/19 0400  vancomycin (VANCOCIN) 1,500 mg in sodium chloride 0.9 % 250 mL IVPB     1,500 mg 125 mL/hr over 120 Minutes Intravenous To Surgery 06/18/19 2124 06/19/19 0840   06/19/19 0400  cefUROXime (ZINACEF) 1.5 g in sodium chloride 0.9 % 100 mL IVPB     1.5 g 200 mL/hr over 30 Minutes Intravenous To Surgery 06/18/19 2124 06/19/19 0830   06/19/19 0400  cefUROXime (ZINACEF) 750 mg in sodium chloride 0.9 % 100 mL IVPB  Status:  Discontinued     750 mg 200 mL/hr over 30 Minutes Intravenous To Surgery 06/18/19 2117 06/19/19 1349       Objective: Vitals: Today's Vitals   06/22/19 0827 06/22/19 1026 06/22/19 1119 06/22/19 1159  BP: (!) 153/87   (!) 143/71  Pulse: 98  86  Resp: 20   (!) 26  Temp: 98.4 F (36.9 C)   98.6 F (37 C)  TempSrc: Axillary   Oral  SpO2: 100%   100%  Weight:      Height:      PainSc:  4  Asleep     Intake/Output Summary (Last 24 hours) at 06/22/2019 1350 Last data filed at 06/22/2019 0944 Gross per 24 hour  Intake 236 ml  Output --  Net 236 ml   Filed Weights   06/20/19 0500 06/21/19 0500 06/22/19 0443  Weight: 118.8 kg 116.1 kg 114.9 kg   Weight change: -1.204 kg   Intake/Output from previous day: 03/27 0701 - 03/28 0700 In: 240 [P.O.:240] Out: 350 [Urine:350] Intake/Output this shift: Total I/O In: 236 [P.O.:236] Out: -   Examination:  General exam: Alert oriented not in acute distress.   HEENT:Oral mucosa moist, Ear/Nose WNL grossly,dentition normal. Respiratory system: bilaterally clear,no wheezing or crackles, cabg site on the anterior chest with wound VAC in place.   Cardiovascular system: S1 & S2 +, regular, No JVD. Gastrointestinal system: Abdomen soft, obese,BS sluggish. Nervous System: Alert awake, moving all her  extremities, nonfocal.   Extremities: Perfused, distal peripheral pulses palpable.  Skin: No rashes,no icterus. MSK: Normal muscle bulk,tone, power.  Data Reviewed: I have personally reviewed following labs and imaging studies CBC: Recent Labs  Lab 06/19/19 1839 06/19/19 1847 06/20/19 0403 06/20/19 0416 06/20/19 1644 06/21/19 0402 06/22/19 0409  WBC 18.9*  --   --  13.1* 13.3* 13.0* 12.0*  HGB 12.0   < > 9.2* 10.5* 10.4* 9.9* 10.5*  HCT 35.3*   < > 27.0* 30.9* 31.2* 29.9* 30.8*  MCV 90.3  --   --  89.8 91.0 91.2 89.0  PLT 253  --   --  201 189 183 231   < > = values in this interval not displayed.   Basic Metabolic Panel: Recent Labs  Lab 06/19/19 1839 06/19/19 1847 06/20/19 0403 06/20/19 0416 06/20/19 1644 06/21/19 0402 06/22/19 0409  NA 140   < > 141 137 133* 138 140  K 4.5   < > 3.5 3.9 3.7 4.3 3.7  CL 111  --   --  105 102 105 107  CO2 19*  --   --  20* 22 24 23   GLUCOSE 151*  --   --  151* 191* 173* 159*  BUN 12  --   --  12 11 11 11   CREATININE 0.44  --   --  0.45 0.52 0.47 0.44  CALCIUM 7.4*  --   --  7.6* 7.4* 7.8* 8.5*  MG 3.0*  --   --  2.3 2.2  --   --    < > = values in this interval not displayed.   GFR: Estimated Creatinine Clearance: 103.8 mL/min (by C-G formula based on SCr of 0.44 mg/dL). Liver Function Tests: No results for input(s): AST, ALT, ALKPHOS, BILITOT, PROT, ALBUMIN in the last 168 hours. No results for input(s): LIPASE, AMYLASE in the last 168 hours. No results for input(s): AMMONIA in the last 168 hours. Coagulation Profile: Recent Labs  Lab 06/19/19 1435  INR 1.3*   Cardiac Enzymes: No results for input(s): CKTOTAL, CKMB, CKMBINDEX, TROPONINI in the last 168 hours. BNP (last 3 results) No results for input(s): PROBNP in the last 8760 hours. HbA1C: No results for input(s): HGBA1C in the last 72 hours. CBG: Recent Labs  Lab 06/21/19 1716 06/21/19 2103 06/22/19 6546 06/22/19 0825  06/22/19 1157  GLUCAP 201* 193* 161* 138*  176*   Lipid Profile: No results for input(s): CHOL, HDL, LDLCALC, TRIG, CHOLHDL, LDLDIRECT in the last 72 hours. Thyroid Function Tests: No results for input(s): TSH, T4TOTAL, FREET4, T3FREE, THYROIDAB in the last 72 hours. Anemia Panel: No results for input(s): VITAMINB12, FOLATE, FERRITIN, TIBC, IRON, RETICCTPCT in the last 72 hours. Sepsis Labs: No results for input(s): PROCALCITON, LATICACIDVEN in the last 168 hours.  Recent Results (from the past 240 hour(s))  SARS CORONAVIRUS 2 (TAT 6-24 HRS) Nasopharyngeal Nasopharyngeal Swab     Status: None   Collection Time: 06/16/19 11:38 PM   Specimen: Nasopharyngeal Swab  Result Value Ref Range Status   SARS Coronavirus 2 NEGATIVE NEGATIVE Final    Comment: (NOTE) SARS-CoV-2 target nucleic acids are NOT DETECTED. The SARS-CoV-2 RNA is generally detectable in upper and lower respiratory specimens during the acute phase of infection. Negative results do not preclude SARS-CoV-2 infection, do not rule out co-infections with other pathogens, and should not be used as the sole basis for treatment or other patient management decisions. Negative results must be combined with clinical observations, patient history, and epidemiological information. The expected result is Negative. Fact Sheet for Patients: SugarRoll.be Fact Sheet for Healthcare Providers: https://www.woods-mathews.com/ This test is not yet approved or cleared by the [place] FDA and  has been authorized for detection and/or diagnosis of SARS-CoV-2 by FDA under an Emergency Use Authorization (EUA). This EUA will remain  in effect (meaning this test can be used) for the duration of the COVID-19 declaration under Section 56 4(b)(1) of the Act, 21 U.S.C. section 360bbb-3(b)(1), unless the authorization is terminated or revoked sooner. Performed at [facility], [place] [address], [place], [place] [address]   Surgical pcr screen      Status: None   Collection Time: 06/18/19  3:12 AM   Specimen: Nasal Mucosa; Nasal Swab  Result Value Ref Range Status   MRSA, PCR NEGATIVE NEGATIVE Final   Staphylococcus aureus NEGATIVE NEGATIVE Final    Comment: (NOTE) The Xpert SA Assay (FDA approved for NASAL specimens in patients 60 years of age and older), is one component of a comprehensive surveillance program. It is not intended to diagnose infection nor to guide or monitor treatment. Performed at [facility], [place] [address] [place], [place] [address]       Radiology Studies: DG Chest 2 View  Result Date: 06/22/2019 CLINICAL DATA:  Atelectasis. EXAM: CHEST - 2 VIEW COMPARISON:  Chest radiograph 06/21/2019 FINDINGS: Monitoring leads overlie the patient. Stable cardiac and mediastinal contours status post median sternotomy. Interval removal right IJ central venous catheter. Small left pleural effusion with underlying consolidation. No pneumothorax. Gaseous distended loops of small bowel. IMPRESSION: Small left effusion with underlying consolidation. Gaseous distended loops of small bowel may represent ileus. Electronically Signed   By: Lovey Newcomer M.D.   On: 06/22/2019 09:29   DG Chest Port 1 View  Result Date: 06/21/2019 CLINICAL DATA:  Status post CABG EXAM: PORTABLE CHEST 1 VIEW COMPARISON:  06/20/2019 FINDINGS: Right jugular sheath with the tip projecting over the cavoatrial junction. Left basilar atelectasis. No pleural effusion. No pneumothorax. Stable cardiomegaly. Recent CABG. No acute osseous abnormality. IMPRESSION: Status post CABG. Left basilar atelectasis. Right jugular sheath with the tip projecting over the cavoatrial junction. Electronically Signed   By: Kathreen Devoid   On: 06/21/2019 09:05     LOS: 4 days   Time spent: More than 50% of that  time was spent in counseling and/or coordination of care.  Antonieta Pert, MD [facility]  06/22/2019, 1:50 PM

## 2019-06-22 NOTE — Progress Notes (Signed)
Progress Note  Patient Name: Beverly [name] Date of Encounter: 06/22/2019  Primary Cardiologist:  Clifton James   Subjective   44 year old female with a history of coronary artery disease.  She is status post previous stent placement.  She now status post coronary artery bypass grafting.  She also has a history of hypertension.  Her blood pressures been increasing gradually throughout the past couple of days.  She is been somewhat slow to get up and around.  She is not been walking for the past couple of days.    Inpatient Medications    Scheduled Meds: . acetaminophen  1,000 mg Oral Q6H  . aspirin EC  325 mg Oral Daily  . atorvastatin  80 mg Oral q1800  . bisacodyl  10 mg Oral Daily   Or  . bisacodyl  10 mg Rectal Daily  . carvedilol  12.5 mg Oral BID WC  . Chlorhexidine Gluconate Cloth  6 each Topical Daily  . docusate sodium  200 mg Oral Daily  . enoxaparin (LOVENOX) injection  40 mg Subcutaneous QHS  . furosemide  20 mg Intravenous BID  . gabapentin  300 mg Oral QHS  . insulin aspart  0-24 Units Subcutaneous TID AC & HS  . insulin detemir  35 Units Subcutaneous BID  . pantoprazole  40 mg Oral Daily  . potassium chloride  20 mEq Oral BID  . sodium chloride flush  3 mL Intravenous Q12H   Continuous Infusions: . sodium chloride    . lactated ringers     PRN Meds: dextrose, metoprolol tartrate, morphine injection, ondansetron (ZOFRAN) IV, oxyCODONE, simethicone, sodium chloride flush, traMADol   Vital Signs    Vitals:   06/22/19 0006 06/22/19 0443 06/22/19 0827 06/22/19 1159  BP: 124/72 (!) 154/86 (!) 153/87 (!) 143/71  Pulse: 100 100 98 86  Resp: (!) 22 19 20  (!) 26  Temp: 98.4 F (36.9 C) 98 F (36.7 C) 98.4 F (36.9 C) 98.6 F (37 C)  TempSrc: Oral Oral Axillary Oral  SpO2: 95% 93% 100% 100%  Weight:  114.9 kg    Height:        Intake/Output Summary (Last 24 hours) at 06/22/2019 1315 Last data filed at 06/22/2019 0944 Gross per 24 hour  Intake 236 ml   Output -  Net 236 ml   Last 3 Weights 06/22/2019 06/21/2019 06/20/2019  Weight (lbs) 253 lb 4.8 oz 255 lb 15.3 oz 261 lb 14.5 oz  Weight (kg) 114.896 kg 116.1 kg 118.8 kg      Telemetry    Sinus - Personally Reviewed  ECG     - Personally Reviewed  Physical Exam    GEN:  Obese, middle-aged female, no acute distress.  She is splinting. Neck: No JVD Cardiac: RRR, no murmurs, rubs, or gallops.  Respiratory: Clear to auscultation bilaterally. GI: Soft, nontender, non-distended  MS: No edema; No deformity. Neuro:  Nonfocal  Psych: Normal affect   Labs    High Sensitivity Troponin:   Recent Labs  Lab 06/16/19 1620 06/16/19 1949  TROPONINIHS 14 16      Chemistry Recent Labs  Lab 06/20/19 1644 06/21/19 0402 06/22/19 0409  NA 133* 138 140  K 3.7 4.3 3.7  CL 102 105 107  CO2 22 24 23   GLUCOSE 191* 173* 159*  BUN 11 11 11   CREATININE 0.52 0.47 0.44  CALCIUM 7.4* 7.8* 8.5*  GFRNONAA >60 >60 >60  GFRAA >60 >60 >60  ANIONGAP 9 9 10  Hematology Recent Labs  Lab 06/20/19 1644 06/21/19 0402 06/22/19 0409  WBC 13.3* 13.0* 12.0*  RBC 3.43* 3.28* 3.46*  HGB 10.4* 9.9* 10.5*  HCT 31.2* 29.9* 30.8*  MCV 91.0 91.2 89.0  MCH 30.3 30.2 30.3  MCHC 33.3 33.1 34.1  RDW 13.1 13.1 13.2  PLT 189 183 231    BNPNo results for input(s): BNP, PROBNP in the last 168 hours.   DDimer  Recent Labs  Lab 06/16/19 2200  DDIMER 0.81*     Radiology    DG Chest 2 View  Result Date: 06/22/2019 CLINICAL DATA:  Atelectasis. EXAM: CHEST - 2 VIEW COMPARISON:  Chest radiograph 06/21/2019 FINDINGS: Monitoring leads overlie the patient. Stable cardiac and mediastinal contours status post median sternotomy. Interval removal right IJ central venous catheter. Small left pleural effusion with underlying consolidation. No pneumothorax. Gaseous distended loops of small bowel. IMPRESSION: Small left effusion with underlying consolidation. Gaseous distended loops of small bowel may  represent ileus. Electronically Signed   By: Lovey Newcomer M.D.   On: 06/22/2019 09:29   DG Chest Port 1 View  Result Date: 06/21/2019 CLINICAL DATA:  Status post CABG EXAM: PORTABLE CHEST 1 VIEW COMPARISON:  06/20/2019 FINDINGS: Right jugular sheath with the tip projecting over the cavoatrial junction. Left basilar atelectasis. No pleural effusion. No pneumothorax. Stable cardiomegaly. Recent CABG. No acute osseous abnormality. IMPRESSION: Status post CABG. Left basilar atelectasis. Right jugular sheath with the tip projecting over the cavoatrial junction. Electronically Signed   By: Kathreen Devoid   On: 06/21/2019 09:05    Cardiac Studies     Patient Profile     44 y.o. female with cad   Assessment & Plan    1.  Coronary artery disease: She is now status post coronary artery bypass grafting.  She seems to be doing well.  2.  Hypertension: She is on carvedilol 12.5 mg twice a day.  She is also on some Lasix.  We will start her on losartan 50 mg a day.  She was on losartan 100 mg a day at home.  She admits to eating a bit more salt than she should.  We reviewed a good low-salt diet.       For questions or updates, please contact [place] Please consult [email or web address] for contact info under        Signed, Mertie Moores, MD  06/22/2019, 1:15 PM

## 2019-06-22 NOTE — Anesthesia Postprocedure Evaluation (Signed)
Anesthesia Post Note  Patient: HAJER DWYER  Procedure(s) Performed: CORONARY ARTERY BYPASS GRAFTING (CABG) x 3 WITH ENDOSCOPIC HARVESTING OF LEFT GREATER SAPHENOUS VEIN. LIMA TO LAD, SVG TO PD, SVG TO RAMUS (N/A Chest) TRANSESOPHAGEAL ECHOCARDIOGRAM (TEE) (N/A ) Application Of Wound Vac USING PREVENA INCISIONAL DRESSING (N/A Chest)     Patient location during evaluation: SICU Anesthesia Type: General Level of consciousness: sedated Pain management: pain level controlled Vital Signs Assessment: post-procedure vital signs reviewed and stable Respiratory status: patient remains intubated per anesthesia plan Cardiovascular status: stable Postop Assessment: no apparent nausea or vomiting Anesthetic complications: no    Last Vitals:  Vitals:   06/22/19 1159 06/22/19 1600  BP: (!) 143/71 (!) 130/59  Pulse: 86   Resp: (!) 26 (!) 28  Temp: 37 C 36.8 C  SpO2: 100% 97%    Last Pain:  Vitals:   06/22/19 1600  TempSrc: Oral  PainSc:                  Cecile Hearing

## 2019-06-22 NOTE — Progress Notes (Signed)
Pt assisted to ambulate approx 350 ft.  HR up 112 but back to 90's once in recliner.  No complaints.  Will con't plan of care.

## 2019-06-23 LAB — C DIFFICILE QUICK SCREEN W PCR REFLEX
C Diff antigen: NEGATIVE
C Diff interpretation: NOT DETECTED
C Diff toxin: NEGATIVE

## 2019-06-23 LAB — GLUCOSE, CAPILLARY
Glucose-Capillary: 135 mg/dL — ABNORMAL HIGH (ref 70–99)
Glucose-Capillary: 223 mg/dL — ABNORMAL HIGH (ref 70–99)
Glucose-Capillary: 122 mg/dL — ABNORMAL HIGH (ref 70–99)
Glucose-Capillary: 136 mg/dL — ABNORMAL HIGH (ref 70–99)

## 2019-06-23 LAB — ECHO INTRAOPERATIVE TEE
Height: 60 in
Weight: 3854.4 oz

## 2019-06-23 MED ORDER — ACETAMINOPHEN 500 MG PO TABS: 1000.0000 mg | ORAL_TABLET | Freq: Four times a day (QID) | ORAL | 0 refills | Status: DC | PRN

## 2019-06-23 MED ORDER — FUROSEMIDE 40 MG PO TABS
40.0000 mg | ORAL_TABLET | Freq: Every day | ORAL | Status: DC
  Administered 2019-06-23 – 2019-06-24 (×2): 40 mg via ORAL
  Filled 2019-06-23 (×2): qty 1

## 2019-06-23 MED ORDER — OXYCODONE HCL 5 MG PO TABS: 5.0000 mg | ORAL_TABLET | ORAL | 0 refills | Status: DC | PRN

## 2019-06-23 MED ORDER — ASPIRIN 325 MG PO TBEC: 325.0000 mg | DELAYED_RELEASE_TABLET | Freq: Every day | ORAL | 0 refills | Status: DC

## 2019-06-23 MED ORDER — FUROSEMIDE 40 MG PO TABS: 40.0000 mg | ORAL_TABLET | Freq: Every day | ORAL | 0 refills | Status: DC

## 2019-06-23 MED ORDER — DIPHENOXYLATE-ATROPINE 2.5-0.025 MG PO TABS
1.0000 | ORAL_TABLET | Freq: Four times a day (QID) | ORAL | Status: DC | PRN
  Administered 2019-06-23 – 2019-06-24 (×3): 1 via ORAL
  Filled 2019-06-23 (×3): qty 1

## 2019-06-23 MED ORDER — ATORVASTATIN CALCIUM 80 MG PO TABS: 80.0000 mg | ORAL_TABLET | Freq: Every day | ORAL | 3 refills | Status: DC

## 2019-06-23 NOTE — Progress Notes (Signed)
CARDIAC REHAB PHASE I   PRE:  Rate/Rhythm: 82 SR    BP: sitting 113/60    SaO2: 98 RA  MODE:  Ambulation: 340 ft   POST:  Rate/Rhythm: 118 ST    BP: sitting 140/55     SaO2: 96 RA  Pt able to stand and walk with RW independently. Slow and steady, HR elevated but no major c/o. To recliner. Discussed sternal precautions, IS, exercise, diet, and CRPII. Will refer to [facility]. Pt is motivated to work on diet and lifestyle. [phone]   Harriet Masson CES, ACSM 06/23/2019 10:00 AM

## 2019-06-23 NOTE — Progress Notes (Addendum)
      [address]       [place] [address]             [phone]      4 Days Post-Op Procedure(s) (LRB): CORONARY ARTERY BYPASS GRAFTING (CABG) x 3 WITH ENDOSCOPIC HARVESTING OF LEFT GREATER SAPHENOUS VEIN. LIMA TO LAD, SVG TO PD, SVG TO RAMUS (N/A) TRANSESOPHAGEAL ECHOCARDIOGRAM (TEE) (N/A) Application Of Wound Vac USING PREVENA INCISIONAL DRESSING (N/A)   Subjective:  Patient having diarrhea.  She states her pain is not that bad.  Has some expected numbness of right hand.  She is ambulating.  Objective: Vital signs in last 24 hours: Temp:  [97.9 F (36.6 C)-98.6 F (37 C)] 98 F (36.7 C) (03/29 0419) Pulse Rate:  [75-98] 86 (03/29 0419) Cardiac Rhythm: Normal sinus rhythm (03/28 1900) Resp:  [19-28] 20 (03/29 0419) BP: (107-153)/(47-87) 124/47 (03/29 0419) SpO2:  [96 %-100 %] 96 % (03/29 0419) Weight:  [449.6 kg] 114.2 kg (03/29 0419)  Intake/Output from previous day: 03/28 0701 - 03/29 0700 In: 236 [P.O.:236] Out: -   General appearance: alert, cooperative and no distress Heart: regular rate and rhythm Lungs: clear to auscultation bilaterally Abdomen: soft, non-tender; bowel sounds normal; no masses,  no organomegaly Extremities: edema trace Wound: clean and dry  Lab Results: Recent Labs    06/21/19 0402 06/22/19 0409  WBC 13.0* 12.0*  HGB 9.9* 10.5*  HCT 29.9* 30.8*  PLT 183 231   BMET:  Recent Labs    06/21/19 0402 06/22/19 0409  NA 138 140  K 4.3 3.7  CL 105 107  CO2 24 23  GLUCOSE 173* 159*  BUN 11 11  CREATININE 0.47 0.44  CALCIUM 7.8* 8.5*    PT/INR: No results for input(s): LABPROT, INR in the last 72 hours. ABG    Component Value Date/Time   PHART 7.368 06/20/2019 0403   HCO3 19.6 (L) 06/20/2019 0403   TCO2 21 (L) 06/20/2019 0403   ACIDBASEDEF 5.0 (H) 06/20/2019 0403   O2SAT 95.0 06/20/2019 0403   CBG (last 3)  Recent Labs    06/22/19 1600 06/22/19 2140 06/23/19 0637  GLUCAP 191* 280* 135*     Assessment/Plan: S/P Procedure(s) (LRB): CORONARY ARTERY BYPASS GRAFTING (CABG) x 3 WITH ENDOSCOPIC HARVESTING OF LEFT GREATER SAPHENOUS VEIN. LIMA TO LAD, SVG TO PD, SVG TO RAMUS (N/A) TRANSESOPHAGEAL ECHOCARDIOGRAM (TEE) (N/A) Application Of Wound Vac USING PREVENA INCISIONAL DRESSING (N/A)  1. CV- NSR, BP improved- continue Coreg, Cozaar 2. Pulm- no acute issues, off oxygen 3. Renal- creatinine has been stable, weight is trending down continue Lasix 4. GI- diarrhea, stop all stool softners, added Immodium prn 5. DM- sugars controlled 6. Dispo- patient stable, d/c EPW today, remove pravena wound vac, continue current care.. if patient feels up to it may be able to be discharged home later today   LOS: 5 days    Lowella Dandy, PA-C  06/23/2019 Patient seen and examined, agree with above  Viviann Spare C. [name], MD [facility] [phone]

## 2019-06-23 NOTE — Progress Notes (Signed)
Patient had epicardial pacing wires removed around 11:28 am. Vital signs have been stable. She is having diarrhea, and apparently has had for a few days. She is not on stool softeners and was given Lomitil. I discussed with patient if diarrhea resolved, will discharge in am. She is agreeable.

## 2019-06-23 NOTE — Progress Notes (Signed)
MOBILITY TEAM - Progress Note   06/23/19 1304  Mobility  Activity Ambulated in hall  Level of Assistance Independent  Assistive Device None  Distance Ambulated (ft) 500 ft  Mobility Response Tolerated well  Mobility performed by Mobility specialist  Bed Position Chair   During activity: HR 108, SpO2 94% on RA Post-activity: BP 168/89  Patient independent with ambulation without DME (RN and NT aware). Encouraged additional hallway ambulation this afternoon and evening.  Ina Homes, PT, DPT Mobility Team Pager [phone]

## 2019-06-23 NOTE — Progress Notes (Addendum)
Inpatient Diabetes Program Recommendations  AACE/ADA: New Consensus Statement on Inpatient Glycemic Control   Target Ranges:  Prepandial:   less than 140 mg/dL      Peak postprandial:   less than 180 mg/dL (1-2 hours)      Critically ill patients:  140 - 180 mg/dL   Results for Beverly [name], Beverly [name] (MRN [id number]) as of 06/23/2019 09:32  Ref. Range 06/22/2019 08:25 06/22/2019 11:57 06/22/2019 16:00 06/22/2019 21:40 06/23/2019 06:37  Glucose-Capillary Latest Ref Range: 70 - 99 mg/dL 242 (H) 353 (H) 614 (H) 280 (H) 135 (H)  Results for Beverly [name], Beverly [name] (MRN [id number]) as of 06/23/2019 09:32  Ref. Range 06/17/2019 05:00  Hemoglobin A1C Latest Ref Range: 4.8 - 5.6 % 10.2 (H)   Review of Glycemic Control  Outpatient Diabetes medications: Insulin Pump Current orders for Inpatient glycemic control: Levemir 35 units BID, Novolog 0-24 units AC&HS  Inpatient Diabetes Program Recommendations:    Insulin-Meal Coverage: Please consider ordering Novolog 3 units TID with meals for meal coverage if patient eats at least 50% of meals.  Insulin Pump: If insulin pump is resumed prior to discharge, will need to have patient's family bring insulin pump and new pump supplies to the hospital. Patient received Levemir 35 units last at 22:44 last night. Will need to keep in mind that if insulin pump is restarted, it will be delivering basal insulin (per diabetes coordinator note on 06/18/19, pump delivers a total of 40.9 units over 24 hours. If insulin pump is resumed while inpatient, please use Insulin Pump order set to order insulin pump ACHS&2am and discontinue Levemir, Novolog correction scale, and Novolog meal coverage if ordered as recommended above.  Thanks, Orlando Penner, RN, MSN, CDE Diabetes Coordinator Inpatient Diabetes Program [phone] (Team Pager from 8am to 5pm)

## 2019-06-23 NOTE — Progress Notes (Signed)
PT EPW removed and intact. Pt tolerated well. Denies complaints. Vitals monitored q 15 mins x1 hr. Pt agrees to bedrest x 1 hr. Will continue to monitor.  Lacy Duverney, RN

## 2019-06-23 NOTE — Progress Notes (Signed)
MOBILITY TEAM - Progress Note   06/23/19 1542  Mobility  Activity Ambulated in hall  Level of Assistance Independent  Assistive Device None  Distance Ambulated (ft) 300 ft  Mobility Response Tolerated fair  Mobility performed by Mobility specialist  Bed Position Chair   Pt with c/o abdominal discomfort limiting mobility this session, still agreeable to ambulate.  Ina Homes, PT, DPT Mobility Team Pager [phone]

## 2019-06-23 NOTE — Progress Notes (Signed)
[facility] will sign off.   Medication Recommendations: Aspirin 325 mg daily, Lipitor 80 mg daily, Coreg 12.5 mg twice daily and losartan 50 mg daily Other recommendations (labs, testing, etc): Lipid panel as LFT in 6-8 weeks Follow up as an outpatient: Has been arranged

## 2019-06-23 NOTE — Progress Notes (Signed)
44 year old female status post CABG currently under cardio surgery service. Patient is on the bedside chair. Pacer wire being removed, some loose stool and nursing medicating Noted plan for discharge today. Advised going back on insulin pump upon discharge and follow-up with PCP/endocrinologist for diabetic management and checking blood sugar at least 4 times a day at home. Advise further medication further disposition per cardiology/cardiothoracic surgery.

## 2019-06-24 LAB — GLUCOSE, CAPILLARY
Glucose-Capillary: 146 mg/dL — ABNORMAL HIGH (ref 70–99)
Glucose-Capillary: 129 mg/dL — ABNORMAL HIGH (ref 70–99)

## 2019-06-24 MED ORDER — DIPHENOXYLATE-ATROPINE 2.5-0.025 MG PO TABS: 1.0000 | ORAL_TABLET | Freq: Four times a day (QID) | ORAL | Status: DC | PRN

## 2019-06-24 MED ORDER — DIPHENOXYLATE-ATROPINE 2.5-0.025 MG PO TABS: 1.0000 | ORAL_TABLET | Freq: Four times a day (QID) | ORAL | 0 refills | Status: DC | PRN

## 2019-06-24 MED FILL — Sodium Bicarbonate IV Soln 8.4%: INTRAVENOUS | Qty: 50 | Status: AC

## 2019-06-24 MED FILL — Sodium Chloride IV Soln 0.9%: INTRAVENOUS | Qty: 2000 | Status: AC

## 2019-06-24 MED FILL — Mannitol IV Soln 20%: INTRAVENOUS | Qty: 500 | Status: AC

## 2019-06-24 MED FILL — Electrolyte-R (PH 7.4) Solution: INTRAVENOUS | Qty: 4000 | Status: AC

## 2019-06-24 MED FILL — Heparin Sodium (Porcine) Inj 1000 Unit/ML: INTRAMUSCULAR | Qty: 10 | Status: AC

## 2019-06-24 MED FILL — Lidocaine HCl Local Soln Prefilled Syringe 100 MG/5ML (2%): INTRAMUSCULAR | Qty: 5 | Status: AC

## 2019-06-24 MED FILL — Albumin, Human Inj 5%: INTRAVENOUS | Qty: 250 | Status: AC

## 2019-06-24 NOTE — Progress Notes (Addendum)
Pt provided discharge instructions and education. Pt IV removed and intact. Chest tube sutures removed and pt tolerated well. Telebox removed and ccmd notified. Pt denies complaints. Vitals stable. Pt has all belongings. Pt ride to arrive @ 10:30 am. Pt will be tx via wheelchair to meet ride.  Lacy Duverney, RN

## 2019-06-24 NOTE — Progress Notes (Addendum)
      [address]       [place] [address]             [phone]      5 Days Post-Op Procedure(s) (LRB): CORONARY ARTERY BYPASS GRAFTING (CABG) x 3 WITH ENDOSCOPIC HARVESTING OF LEFT GREATER SAPHENOUS VEIN. LIMA TO LAD, SVG TO PD, SVG TO RAMUS (N/A) TRANSESOPHAGEAL ECHOCARDIOGRAM (TEE) (N/A) Application Of Wound Vac USING PREVENA INCISIONAL DRESSING (N/A)   Subjective:  Patient with continued to diarrhea.  However, she really wants to go home.  She states she has no other complaints and feels like she will be better at home.  Objective: Vital signs in last 24 hours: Temp:  [97.7 F (36.5 C)-98.4 F (36.9 C)] 97.8 F (36.6 C) (03/30 0753) Pulse Rate:  [77-82] 78 (03/30 0753) Cardiac Rhythm: Normal sinus rhythm (03/30 0753) Resp:  [18-23] 20 (03/30 0753) BP: (113-151)/(51-73) 119/62 (03/30 0753) SpO2:  [99 %-100 %] 99 % (03/30 0753) Weight:  [114.8 kg] 114.8 kg (03/30 0548)  Intake/Output from previous day: 03/29 0701 - 03/30 0700 In: 400 [P.O.:400] Out: 0   General appearance: alert, cooperative and no distress Heart: regular rate and rhythm Lungs: clear to auscultation bilaterally Abdomen: soft, non-tender; bowel sounds normal; no masses,  no organomegaly Extremities: edema trace Wound: clean and dry  Lab Results: Recent Labs    06/22/19 0409  WBC 12.0*  HGB 10.5*  HCT 30.8*  PLT 231   BMET:  Recent Labs    06/22/19 0409  NA 140  K 3.7  CL 107  CO2 23  GLUCOSE 159*  BUN 11  CREATININE 0.44  CALCIUM 8.5*    PT/INR: No results for input(s): LABPROT, INR in the last 72 hours. ABG    Component Value Date/Time   PHART 7.368 06/20/2019 0403   HCO3 19.6 (L) 06/20/2019 0403   TCO2 21 (L) 06/20/2019 0403   ACIDBASEDEF 5.0 (H) 06/20/2019 0403   O2SAT 95.0 06/20/2019 0403   CBG (last 3)  Recent Labs    06/23/19 2216 06/24/19 0402 06/24/19 0627  GLUCAP 136* 146* 129*    Assessment/Plan: S/P Procedure(s) (LRB): CORONARY  ARTERY BYPASS GRAFTING (CABG) x 3 WITH ENDOSCOPIC HARVESTING OF LEFT GREATER SAPHENOUS VEIN. LIMA TO LAD, SVG TO PD, SVG TO RAMUS (N/A) TRANSESOPHAGEAL ECHOCARDIOGRAM (TEE) (N/A) Application Of Wound Vac USING PREVENA INCISIONAL DRESSING (N/A)  1. CV- hemodynamically stable in NSR- continue Coreg, Cozaar 2. Pulm- no acute issues, continue IS 3. Renal- creatinine has been stable, weight is stable  Will d/c Lasix, potassium, due to diarrhea 4. GI- diarrhea, C. Diff negative, common side effect of protonix will d/c continue Lomotil prn 5. Dispo- patient stable will d/c home today, continue lomotil until diarrhea resolved   LOS: 6 days    Lowella Dandy, PA-C 06/24/2019 Patient seen and examined, agree with above She continues to have loose stools despite stopping laxatives. Will dc protonix as noted above. C diff negative. Will dc home, PRN imodium, no lasix  Viviann Spare C. [name], MD [facility] [phone]

## 2019-06-24 NOTE — Progress Notes (Signed)
Inpatient Diabetes Program Recommendations  AACE/ADA: New Consensus Statement on Inpatient Glycemic Control   Target Ranges:  Prepandial:   less than 140 mg/dL      Peak postprandial:   less than 180 mg/dL (1-2 hours)      Critically ill patients:  140 - 180 mg/dL   Results for Beverly [name], Beverly [name] (MRN [id number]) as of 06/24/2019 09:11  Ref. Range 06/23/2019 06:37 06/23/2019 11:17 06/23/2019 17:01 06/23/2019 22:16 06/24/2019 04:02 06/24/2019 06:27  Glucose-Capillary Latest Ref Range: 70 - 99 mg/dL 408 (H) 144 (H) 818 (H) 136 (H) 146 (H) 129 (H)   Review of Glycemic Control  Outpatient Diabetes medications: Insulin Pump Current orders for Inpatient glycemic control: Levemir 35 units BID, Novolog 0-24 units AC&HS  Inpatient Diabetes Program Recommendations:   Insulin-Basal: Patient discharging home today and plans to resume her insulin pump once she gets home. Recommend not giving Levemir 35 units this morning scheduled for 10:00 since patient will resume her insulin pump today.  NOTE: Noted in chart that patient is being discharged home today and her ride will arrive at 10:30 am. Spoke with patient over the phone and she states that she does not have her insulin pump here at the hospital but she plans to resume her insulin pump today once she gets home. Reminded patient that she received Levemir 35 units last at 22:24 last night and when she resumes her insulin pump it will be delivering her hourly basal insulin. Encouraged patient to closely monitor glucose and if she experiences issues with hyperglycemia to reach out to provider that helps to manage her insulin pump as she may need some adjustments to keep glucose well controlled. Discussed importance of glycemic control following CABG surgery. Patient verbalized understanding of information and states that she has no questions at this time. Sent chat message to J. Jake Shark, RN to let her know that it was recommended that patient not receive Levemir 35  units scheduled for 10am today since she was planning to resume her insulin pump when she gets home today.  Thanks, Orlando Penner, RN, MSN, CDE Diabetes Coordinator Inpatient Diabetes Program [phone] (Team Pager from 8am to 5pm)

## 2019-06-30 ENCOUNTER — Telehealth (HOSPITAL_COMMUNITY): Payer: Self-pay

## 2019-06-30 NOTE — Telephone Encounter (Signed)
Faxed pt referral for Cardiac Rehab to [facility].

## 2019-07-01 ENCOUNTER — Telehealth: Payer: Self-pay

## 2019-07-01 ENCOUNTER — Other Ambulatory Visit: Payer: Self-pay | Admitting: Physician Assistant

## 2019-07-01 NOTE — Telephone Encounter (Signed)
Pt calls office w/ c/o R arm pain (from elbow to wrist) s/p CABG x 3 on 06/19/19.  She states it has been hurting since surgery, but it has gotten worse. States Dr. [name] told her after surgery that R arm pain was likely due to her positioning on the OR table (no graft from arm). She says her R hand is numb, but this has been the case since surgery as well. She states she has some bruising to that arm, but otherwise normal in color and temperature. Notified Dr. [name] of the above information; orders for pt to be evaluated in the office. PA visit scheduled for tomorrow at 1:30.

## 2019-07-02 ENCOUNTER — Ambulatory Visit (INDEPENDENT_AMBULATORY_CARE_PROVIDER_SITE_OTHER): Payer: Self-pay | Admitting: Physician Assistant

## 2019-07-02 ENCOUNTER — Other Ambulatory Visit: Payer: Self-pay

## 2019-07-02 VITALS — BP 170/87 | HR 96 | Temp 97.6°F | Resp 20 | Ht 60.0 in | Wt 250.0 lb

## 2019-07-02 DIAGNOSIS — I2510 Atherosclerotic heart disease of native coronary artery without angina pectoris: Secondary | ICD-10-CM

## 2019-07-02 DIAGNOSIS — M79603 Pain in arm, unspecified: Secondary | ICD-10-CM

## 2019-07-02 DIAGNOSIS — Z951 Presence of aortocoronary bypass graft: Secondary | ICD-10-CM

## 2019-07-02 MED ORDER — OXYCODONE HCL 5 MG PO TABS: 5.0000 mg | ORAL_TABLET | Freq: Three times a day (TID) | ORAL | 0 refills | Status: AC | PRN

## 2019-07-02 NOTE — Progress Notes (Signed)
[address]       [place] [address]             [phone]      Beverly [name] is a 44 y.o. female patient is s/p CABG x 3 utilizing LIMA to LAD, SVG to Ramus Intermediate, and SVG to PDA on 3/25 with Dr. Roxan Hockey. Her radial artery was exposed but unusable therefore, only greater saphenous vein was used. She presents to the clinic today with the complaint of right shoulder pain and numbness. She also had point tenderness along the right arm along the bone. Of note, there were no incisions on this arm-her left arm was the one that was explored.    No diagnosis found. Past Medical History:  Diagnosis Date  . CAD (coronary artery disease) 06/16/2019  . Diabetes mellitus without complication ([place])   . Hypertension   . Multiple thyroid nodules   . Obesity    No past surgical history pertinent negatives on file. Scheduled Meds: Current Outpatient Medications on File Prior to Visit  Medication Sig Dispense Refill  . acetaminophen (TYLENOL) 500 MG tablet Take 2 tablets (1,000 mg total) by mouth every 6 (six) hours as needed. 30 tablet 0  . aluminum-magnesium hydroxide-simethicone (MAALOX) 403-474-25 MG/5ML SUSP Take 30 mLs by mouth 3 (three) times daily as needed (for gas/indigestion).     [name] aspirin EC 325 MG EC tablet Take 1 tablet (325 mg total) by mouth daily. 30 tablet 0  . atorvastatin (LIPITOR) 80 MG tablet Take 1 tablet (80 mg total) by mouth daily at 6 PM. 30 tablet 3  . carvedilol (COREG) 12.5 MG tablet Take 12.5 mg by mouth in the morning.     . diphenoxylate-atropine (LOMOTIL) 2.5-0.025 MG tablet Take 1-2 tablets by mouth 4 (four) times daily as needed for diarrhea or loose stools. 90 tablet 0  . gabapentin (NEURONTIN) 300 MG capsule Take 300 mg by mouth at bedtime.    . Insulin Human (INSULIN PUMP) SOLN Inject 1 each into the skin continuous. MedTronic- Uses Humalog    . insulin lispro (HUMALOG) 100 UNIT/ML injection Inject into the skin See admin  instructions. For use in [employer] insulin pump    . losartan (COZAAR) 100 MG tablet Take 100 mg by mouth daily.     . Multiple Vitamins-Calcium (ONE-A-DAY WOMENS FORMULA) TABS Take 1 tablet by mouth daily with breakfast.    . oxyCODONE (OXY IR/ROXICODONE) 5 MG immediate release tablet Take 1-2 tablets (5-10 mg total) by mouth every 4 (four) hours as needed for severe pain. 30 tablet 0  . simethicone (GAS-X EXTRA STRENGTH) 125 MG chewable tablet Chew 125 mg by mouth every 6 (six) hours as needed for flatulence.     No current facility-administered medications on file prior to visit.    Allergies  Allergen Reactions  . Shellfish-Derived Products Diarrhea  . Tape Other (See Comments)    Can blister the skin   Active Problems:   * No active hospital problems. *  There were no vitals taken for this visit.  Subjective Presents today for RIGHT arm pain. This was not the arm explored for the radial artery. She states her right shoulder is hurting her around the shoulder blade and extends down the arm. She also had some point tenderness along her forearm bone.   Objective   Cor: RRR, no murmur Pulm: CTA bilaterally and in all fields Abd:No tendereness Wound: Clean and dry Ext: No edema, +  pulse in the right extremity, + capillary refill.   No CXR  Assessment & Plan   The patient is a 44 year old female who returns to our office today with a complaint of right sided numbness and pain (non surgical side).  She has limited range of motion of her right upper extremity but is able to reach overhead.  Strength is maintained in that extremity.  She has a pulse which may be slightly weakened but has good capillary refill of her fingertips.  This does not seem to be a vascular issue.  She states that the pain begins in her right shoulder blade and extends down her right arm.  She also has some numbness which she has had since surgery.  She has 2 points of tenderness, one along her mid Radius bone  and another right above her elbow.  I am not sure that these are related to surgery at all and further examination is needed via a primary care provider.  She may need a bone density scan or x-ray of these areas since they do cause her significant pain.  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  I asked her to alternate heat and ice therapy over the shoulder and I gave her several stretches to help with the scapular pain.  I also refilled her pain medication. She is to contact her PCP about her bone tenderness.  She has a follow-up appointment with Dr. [name] on 07/15/2019.  She also has a follow-up appointment next week with cardiology.  If her symptoms worsen or change she is to call our office for another appointment.  Sharlene Dory 07/02/2019

## 2019-07-07 NOTE — Progress Notes (Signed)
Cardiology Office Note    Date:  07/09/2019   ID:  Beverly [name], DOB 10-May-1975, MRN [id number]  PCP:  [name], MD  Cardiologist: [name], MD EPS: None  Chief Complaint  Patient presents with  . Hospitalization Follow-up    History of Present Illness:  Beverly [name] is a 44 y.o. female with history of CAD, previous stent 2009,  s/p CABG x 3 utilizing LIMA to LAD, SVG to Ramus Intermediate, and SVG to PDA on 06/19/19 with Dr. [name].  Also has hypertension, HLD, history of DVT, morbid obesity status post bariatric surgery, PCOS, DM type II on insulin pump.  Patient comes in for regular f/u but went to ED last night for shortness of breath. Left before seen by MD. Earlier in the day she called Dr. Sunday Corn office to see if she could start lasix for 3 lb weight gain and leg edema. only took her first dose last night. CXR improved and minimal effusion.labs and EKG stable. Under a lot of stress with 18 month old daughter at home and causing anxiety. Also having right arm pain from shoulder down to her hands. had xray by PCP and told to ice and take tylenol.-asking for something stronger. Glucose high 307. Walking around the house. Scheduled for cardiac rehab May 11.   Past Medical History:  Diagnosis Date  . CAD (coronary artery disease) 06/16/2019  . Diabetes mellitus without complication (HCC)   . Hypertension   . Multiple thyroid nodules   . Obesity     Past Surgical History:  Procedure Laterality Date  . APPLICATION OF WOUND VAC N/A 06/19/2019   Procedure: Application Of Wound Vac USING PREVENA INCISIONAL DRESSING;  Surgeon: [name], MD;  Location: [facility] OR;  Service: Open Heart Surgery;  Laterality: N/A;  . Cardiac Stent  2009  . CORONARY ARTERY BYPASS GRAFT N/A 06/19/2019   Procedure: CORONARY ARTERY BYPASS GRAFTING (CABG) x 3 WITH ENDOSCOPIC HARVESTING OF LEFT GREATER SAPHENOUS VEIN. LIMA TO LAD, SVG TO PD, SVG TO RAMUS;  Surgeon:  [name], MD;  Location: [facility] OR;  Service: Open Heart Surgery;  Laterality: N/A;  . LEFT HEART CATH AND CORONARY ANGIOGRAPHY N/A 06/18/2019   Procedure: LEFT HEART CATH AND CORONARY ANGIOGRAPHY;  Surgeon: [name], MD;  Location: [facility] INVASIVE CV LAB;  Service: Cardiovascular;  Laterality: N/A;  . TEE WITHOUT CARDIOVERSION N/A 06/19/2019   Procedure: TRANSESOPHAGEAL ECHOCARDIOGRAM (TEE);  Surgeon: [name], MD;  Location: [facility] OR;  Service: Open Heart Surgery;  Laterality: N/A;    Current Medications: Current Meds  Medication Sig  . acetaminophen (TYLENOL) 500 MG tablet Take 2 tablets (1,000 mg total) by mouth every 6 (six) hours as needed.  [name] aluminum-magnesium hydroxide-simethicone (MAALOX) 200-200-20 MG/5ML SUSP Take 30 mLs by mouth 3 (three) times daily as needed (for gas/indigestion).   [name] aspirin EC 325 MG EC tablet Take 1 tablet (325 mg total) by mouth daily.  [name] atorvastatin (LIPITOR) 80 MG tablet Take 1 tablet (80 mg total) by mouth daily at 6 PM.  . carvedilol (COREG) 12.5 MG tablet Take 12.5 mg by mouth in the morning.   . diphenoxylate-atropine (LOMOTIL) 2.5-0.025 MG tablet Take 1-2 tablets by mouth 4 (four) times daily as needed for diarrhea or loose stools.  . furosemide (LASIX) 40 MG tablet Take 1 tablet (40 mg total) by mouth daily.  [name] gabapentin (NEURONTIN) 300 MG capsule Take 300 mg by mouth at bedtime.  . Insulin Human (  INSULIN PUMP) SOLN Inject 1 each into the skin continuous. MedTronic- Uses Humalog  . insulin lispro (HUMALOG) 100 UNIT/ML injection Inject into the skin See admin instructions. For use in MedTronic insulin pump  . losartan (COZAAR) 100 MG tablet Take 100 mg by mouth daily.   . Multiple Vitamins-Calcium (ONE-A-DAY WOMENS FORMULA) TABS Take 1 tablet by mouth daily with breakfast.  . oxyCODONE (OXY IR/ROXICODONE) 5 MG immediate release tablet Take 1 tablet (5 mg total) by mouth every 8 (eight) hours as needed for up to 10 days for severe  pain.  . simethicone (GAS-X EXTRA STRENGTH) 125 MG chewable tablet Chew 125 mg by mouth every 6 (six) hours as needed for flatulence.     Allergies:   Shellfish-derived products and Tape   Social History   Socioeconomic History  . Marital status: Single    Spouse name: Not on file  . Number of children: Not on file  . Years of education: Not on file  . Highest education level: Not on file  Occupational History  . Not on file  Tobacco Use  . Smoking status: Never Smoker  . Smokeless tobacco: Never Used  Substance and Sexual Activity  . Alcohol use: Never  . Drug use: Never  . Sexual activity: Yes  Other Topics Concern  . Not on file  Social History Narrative  . Not on file   Social Determinants of Health   Financial Resource Strain:   . Difficulty of Paying Living Expenses:   Food Insecurity:   . Worried About Charity fundraiser in the Last Year:   . Arboriculturist in the Last Year:   Transportation Needs:   . Film/video editor (Medical):   [name] Lack of Transportation (Non-Medical):   Physical Activity:   . Days of Exercise per Week:   . Minutes of Exercise per Session:   Stress:   . Feeling of Stress :   Social Connections:   . Frequency of Communication with Friends and Family:   . Frequency of Social Gatherings with Friends and Family:   . Attends Religious Services:   . Active Member of Clubs or Organizations:   . Attends Archivist Meetings:   [name] Marital Status:      Family History:  The patient's family history includes Diabetes Mellitus II in her father and mother; Heart disease in her father and maternal grandmother; Hypertension in her father and mother.   ROS:   Please see the history of present illness.    ROS All other systems reviewed and are negative.   PHYSICAL EXAM:   VS:  BP 134/84   Pulse 95   Ht 5' (1.524 m)   Wt 241 lb 12.8 oz (109.7 kg)   LMP  (LMP Unknown) Comment: pregnancy test negative  SpO2 98%   BMI 47.22 kg/m    Physical Exam  GEN: Obese, in no acute distress  Neck: no JVD, carotid bruits, or masses Cardiac:RRR; no murmurs, rubs, or gallops  Respiratory:  clear to auscultation bilaterally, normal work of breathing GI: soft, nontender, nondistended, + BS Ext: plus 1-2 leg edema otherwise without cyanosis, clubbing,Good distal pulses bilaterally Neuro:  Alert and Oriented x 3, Psych: euthymic mood, full affect  Wt Readings from Last 3 Encounters:  07/09/19 241 lb 12.8 oz (109.7 kg)  07/08/19 249 lb 1.9 oz (113 kg)  07/02/19 250 lb (113.4 kg)      Studies/Labs Reviewed:   EKG:  EKG is not  ordered today.  The ekg reviewed from ED  Recent Labs: 06/20/2019: Magnesium 2.2 07/08/2019: BUN 17; Creatinine, Ser 0.61; Hemoglobin 10.7; Platelets 393; Potassium 3.9; Sodium 133   Lipid Panel    Component Value Date/Time   CHOL 214 (H) 06/17/2019 0500   TRIG 113 06/17/2019 0500   HDL 35 (L) 06/17/2019 0500   CHOLHDL 6.1 06/17/2019 0500   VLDL 23 06/17/2019 0500   LDLCALC 156 (H) 06/17/2019 0500    Additional studies/ records that were reviewed today include:  Echo: 06/17/19   IMPRESSIONS     1. Left ventricular ejection fraction, by estimation, is 55 to 60%. The  left ventricle has normal function. The left ventricle has no regional  wall motion abnormalities. Left ventricular diastolic function could not  be evaluated.   2. Right ventricular systolic function is normal. The right ventricular  size is normal. Tricuspid regurgitation signal is inadequate for assessing  PA pressure.   3. The mitral valve is degenerative. No evidence of mitral valve  regurgitation. No evidence of mitral stenosis.   4. The aortic valve is tricuspid. Aortic valve regurgitation is not  visualized. Mild aortic valve stenosis.   5. There is mild (Grade II) layered plaque involving the aortic root.      ASSESSMENT:    1. Coronary artery disease involving coronary bypass graft of native heart without angina  pectoris   2. Acute on chronic diastolic CHF (congestive heart failure) (HCC)   3. Hyperlipidemia, unspecified hyperlipidemia type   4. Insulin-requiring or dependent type II diabetes mellitus (HCC)   5. Morbid obesity (HCC)      PLAN:  In order of problems listed above:  CAD, previous stent 2009,  s/p CABG x 3 utilizing LIMA to LAD, SVG to Ramus Intermediate, and SVG to PDA on 3/25 with Dr. [name].  Leg edema and dyspnea. Echo with normal LV function, diastolic function couldn't be evaluated-CXR improved, left without seeing MD, lasix started yesterday by surgeons. 2 gm sodium diet.wants F/u in [place] office  Essential hypertension BP stable  Hyperlipidemia has had issues on higher dose statins in the past.  Consider PCSK9 inhibitors in the future  DM type II on insulin pump-sugars running high.  Morbid obesity status post bariatric surgery-to start cardiac rehab. Weight loss necessary.   Medication Adjustments/Labs and Tests Ordered: Current medicines are reviewed at length with the patient today.  Concerns regarding medicines are outlined above.  Medication changes, Labs and Tests ordered today are listed in the Patient Instructions below. Patient Instructions  Medication Instructions:  Your physician recommends that you continue on your current medications as directed. Please refer to the Current Medication list given to you today.  If you need a refill on your cardiac medications before your next appointment, please call your pharmacy.   Lab work: None Ordered  If you have labs (blood work) drawn today and your tests are completely normal, you will receive your results only by: [name] MyChart Message (if you have MyChart) OR . A paper copy in the mail If you have any lab test that is abnormal or we need to change your treatment, we will call you to review the results.  Testing/Procedures: None ordered  Follow-Up: . Follow up with Dr. Bing Matter in the [facility]  on 08/18/19 at 8:40 AM  Any Other Special Instructions Will Be Listed Below (If Applicable).       Elson Clan, PA-C  07/09/2019 11:57 AM    [facility]  [address], [place], [place]  [address] Phone: [phone]; Fax: [phone]

## 2019-07-08 ENCOUNTER — Encounter (HOSPITAL_COMMUNITY): Payer: Self-pay | Admitting: Emergency Medicine

## 2019-07-08 ENCOUNTER — Emergency Department (HOSPITAL_COMMUNITY): Payer: 59

## 2019-07-08 ENCOUNTER — Telehealth: Payer: Self-pay

## 2019-07-08 ENCOUNTER — Emergency Department (HOSPITAL_COMMUNITY)
Admission: EM | Admit: 2019-07-08 | Discharge: 2019-07-09 | Disposition: A | Discharge status: Home / Self Care | Payer: 59 | Attending: Emergency Medicine | Admitting: Emergency Medicine

## 2019-07-08 DIAGNOSIS — R0602 Shortness of breath: Secondary | ICD-10-CM | POA: Diagnosis present

## 2019-07-08 DIAGNOSIS — Z5321 Procedure and treatment not carried out due to patient leaving prior to being seen by health care provider: Secondary | ICD-10-CM | POA: Insufficient documentation

## 2019-07-08 DIAGNOSIS — Z951 Presence of aortocoronary bypass graft: Secondary | ICD-10-CM | POA: Insufficient documentation

## 2019-07-08 LAB — BASIC METABOLIC PANEL
Anion gap: 9 (ref 5–15)
BUN: 17 mg/dL (ref 6–20)
CO2: 25 mmol/L (ref 22–32)
Calcium: 8.9 mg/dL (ref 8.9–10.3)
Chloride: 99 mmol/L (ref 98–111)
Creatinine, Ser: 0.61 mg/dL (ref 0.44–1.00)
GFR calc Af Amer: >60 mL/min (ref >60)
GFR calc non Af Amer: >60 mL/min (ref >60)
Glucose, Bld: 307 mg/dL — ABNORMAL HIGH (ref 70–99)
Potassium: 3.9 mmol/L (ref 3.5–5.1)
Sodium: 133 mmol/L — ABNORMAL LOW (ref 135–145)

## 2019-07-08 LAB — CBC
HCT: 33.2 % — ABNORMAL LOW (ref 36.0–46.0)
Hemoglobin: 10.7 g/dL — ABNORMAL LOW (ref 12.0–15.0)
MCH: 29.7 pg (ref 26.0–34.0)
MCHC: 32.2 g/dL (ref 30.0–36.0)
MCV: 92.2 fL (ref 80.0–100.0)
Platelets: 393 10*3/uL (ref 150–400)
RBC: 3.6 MIL/uL — ABNORMAL LOW (ref 3.87–5.11)
RDW: 14.5 % (ref 11.5–15.5)
WBC: 8.1 10*3/uL (ref 4.0–10.5)
nRBC: 0 % (ref 0.0–0.2)

## 2019-07-08 LAB — D-DIMER, QUANTITATIVE: D-Dimer, Quant: 2.06 ug/mL-FEU — ABNORMAL HIGH (ref 0.00–0.50)

## 2019-07-08 LAB — I-STAT BETA HCG BLOOD, ED (MC, WL, AP ONLY): I-stat hCG, quantitative: <5 m[IU]/mL (ref <5)

## 2019-07-08 MED ORDER — SODIUM CHLORIDE 0.9% FLUSH: 3.0000 mL | Freq: Once | INTRAVENOUS | Status: DC

## 2019-07-08 MED ORDER — FUROSEMIDE 40 MG PO TABS: 40.0000 mg | ORAL_TABLET | Freq: Every day | ORAL | 0 refills | Status: DC

## 2019-07-08 NOTE — ED Triage Notes (Signed)
Pt c/o shob onset 1930 tonight, lungs clear sat WNL, EKG WNL, pt did have CABG 06/20/19.

## 2019-07-08 NOTE — Telephone Encounter (Signed)
Pt calls office to report that she has worsening edema in calves today and that she has gained 3 lbs since 07/06/19. She wants to know if she can take Lasix for this (she has pills remaining from post-op prescription). Pt is s/p CABG x 3 by Dr. [name] on 06/19/19. Pt denies sob. Discussed the above w/ Dr. [name]; orders Lasix 40 mg daily. Notified pt and encouraged her to eat a banana daily. Pt has a f/u appt w/ Dr. [name] on 07/15/19.

## 2019-07-09 ENCOUNTER — Encounter: Payer: Self-pay | Admitting: Physician Assistant

## 2019-07-09 ENCOUNTER — Ambulatory Visit (INDEPENDENT_AMBULATORY_CARE_PROVIDER_SITE_OTHER): Payer: 59 | Admitting: Physician Assistant

## 2019-07-09 ENCOUNTER — Other Ambulatory Visit: Payer: Self-pay

## 2019-07-09 VITALS — BP 134/84 | HR 95 | Ht 60.0 in | Wt 241.8 lb

## 2019-07-09 DIAGNOSIS — I25810 Atherosclerosis of coronary artery bypass graft(s) without angina pectoris: Secondary | ICD-10-CM

## 2019-07-09 DIAGNOSIS — E785 Hyperlipidemia, unspecified: Secondary | ICD-10-CM

## 2019-07-09 DIAGNOSIS — E119 Type 2 diabetes mellitus without complications: Secondary | ICD-10-CM | POA: Diagnosis not present

## 2019-07-09 DIAGNOSIS — Z794 Long term (current) use of insulin: Secondary | ICD-10-CM

## 2019-07-09 DIAGNOSIS — I5033 Acute on chronic diastolic (congestive) heart failure: Secondary | ICD-10-CM | POA: Diagnosis not present

## 2019-07-09 NOTE — Patient Instructions (Signed)
Medication Instructions:  Your physician recommends that you continue on your current medications as directed. Please refer to the Current Medication list given to you today.  If you need a refill on your cardiac medications before your next appointment, please call your pharmacy.   Lab work: None Ordered  If you have labs (blood work) drawn today and your tests are completely normal, you will receive your results only by: [name] MyChart Message (if you have MyChart) OR . A paper copy in the mail If you have any lab test that is abnormal or we need to change your treatment, we will call you to review the results.  Testing/Procedures: None ordered  Follow-Up: . Follow up with Dr. Bing Matter in the [facility] on 08/18/19 at 8:40 AM  Any Other Special Instructions Will Be Listed Below (If Applicable).

## 2019-07-09 NOTE — ED Notes (Signed)
Pt visualized getting into a vehicle and leaving. Unable to speak with pt at that time.

## 2019-07-14 ENCOUNTER — Other Ambulatory Visit: Payer: Self-pay | Admitting: Thoracic Surgery (Cardiothoracic Vascular Surgery)

## 2019-07-14 DIAGNOSIS — Z951 Presence of aortocoronary bypass graft: Secondary | ICD-10-CM

## 2019-07-15 ENCOUNTER — Ambulatory Visit (INDEPENDENT_AMBULATORY_CARE_PROVIDER_SITE_OTHER): Payer: Self-pay | Admitting: Thoracic Surgery (Cardiothoracic Vascular Surgery)

## 2019-07-15 ENCOUNTER — Other Ambulatory Visit: Payer: Self-pay

## 2019-07-15 ENCOUNTER — Ambulatory Visit
Admission: RE | Admit: 2019-07-15 | Discharge: 2019-07-15 | Disposition: A | Discharge status: Home / Self Care | Payer: 59 | Source: Ambulatory Visit | Attending: Thoracic Surgery (Cardiothoracic Vascular Surgery) | Admitting: Thoracic Surgery (Cardiothoracic Vascular Surgery)

## 2019-07-15 ENCOUNTER — Encounter: Payer: Self-pay | Admitting: Thoracic Surgery (Cardiothoracic Vascular Surgery)

## 2019-07-15 VITALS — BP 146/83 | HR 88 | Temp 97.7°F | Resp 16 | Ht 60.0 in | Wt 236.6 lb

## 2019-07-15 DIAGNOSIS — Z951 Presence of aortocoronary bypass graft: Secondary | ICD-10-CM

## 2019-07-15 DIAGNOSIS — I2510 Atherosclerotic heart disease of native coronary artery without angina pectoris: Secondary | ICD-10-CM

## 2019-07-15 MED ORDER — GABAPENTIN 300 MG PO CAPS: 300.0000 mg | ORAL_CAPSULE | Freq: Three times a day (TID) | ORAL | 2 refills | Status: AC

## 2019-07-15 MED ORDER — OXYCODONE HCL 5 MG PO TABS: 5.0000 mg | ORAL_TABLET | ORAL | 0 refills | Status: DC | PRN

## 2019-07-15 NOTE — Progress Notes (Signed)
[address]       [name] [address]             [phone]     HPI: [name] returns for a scheduled follow-up visit  Beverly [name] is a 44 year old woman with a history of coronary disease dating back to 2009.  She also has a history of hypertension, hyperlipidemia, insulin-dependent diabetes, and morbid obesity.  She presented with chest pain associated with nausea and vomiting.  She ruled out for MI, but cath showed severe three-vessel disease.  I performed coronary artery bypass grafting x3 on 06/19/2019.  She had some diarrhea postoperatively but went home on day 5.  Since she has been home her primary complaint is been right arm pain.  She describes this pain is going from her shoulder all the way down to her hand.  She complains of numbness in her hand.  That is most prominent in the ring and little fingers.  She says it is a dull constant aching sensation.  There is no burning or pins-and-needles component.  She has not had recurrent angina.  Past Medical History:  Diagnosis Date  . CAD (coronary artery disease) 06/16/2019  . Diabetes mellitus without complication (HCC)   . Hypertension   . Multiple thyroid nodules   . Obesity     Current Outpatient Medications  Medication Sig Dispense Refill  . acetaminophen (TYLENOL) 500 MG tablet Take 2 tablets (1,000 mg total) by mouth every 6 (six) hours as needed. 30 tablet 0  . aluminum-magnesium hydroxide-simethicone (MAALOX) 200-200-20 MG/5ML SUSP Take 30 mLs by mouth 3 (three) times daily as needed (for gas/indigestion).     [name] aspirin EC 325 MG EC tablet Take 1 tablet (325 mg total) by mouth daily. 30 tablet 0  . atorvastatin (LIPITOR) 80 MG tablet Take 1 tablet (80 mg total) by mouth daily at 6 PM. 30 tablet 3  . carvedilol (COREG) 12.5 MG tablet Take 12.5 mg by mouth in the morning.     . diphenoxylate-atropine (LOMOTIL) 2.5-0.025 MG tablet Take 1-2 tablets by mouth 4 (four) times daily as needed for  diarrhea or loose stools. 90 tablet 0  . furosemide (LASIX) 40 MG tablet Take 1 tablet (40 mg total) by mouth daily. 14 tablet 0  . gabapentin (NEURONTIN) 300 MG capsule Take 1 capsule (300 mg total) by mouth 3 (three) times daily. 90 capsule 2  . Insulin Human (INSULIN PUMP) SOLN Inject 1 each into the skin continuous. MedTronic- Uses Humalog    . insulin lispro (HUMALOG) 100 UNIT/ML injection Inject into the skin See admin instructions. For use in MedTronic insulin pump    . losartan (COZAAR) 100 MG tablet Take 100 mg by mouth daily.     . Multiple Vitamins-Calcium (ONE-A-DAY WOMENS FORMULA) TABS Take 1 tablet by mouth daily with breakfast.    . simethicone (GAS-X EXTRA STRENGTH) 125 MG chewable tablet Chew 125 mg by mouth every 6 (six) hours as needed for flatulence.    [name] oxyCODONE (OXY IR/ROXICODONE) 5 MG immediate release tablet Take 1 tablet (5 mg total) by mouth every 4 (four) hours as needed for severe pain. 30 tablet 0   No current facility-administered medications for this visit.    Physical Exam BP (!) 146/83 (BP Location: Right Arm, Patient Position: Sitting, Cuff Size: Large)   Pulse 88   Temp 97.7 F (36.5 C)   Resp 16   Ht 5' (1.524 m)   Wt 236  lb 9.6 oz (107.3 kg)   LMP  (LMP Unknown) Comment: pregnancy test negative  SpO2 100% Comment: RA  BMI 46.21 kg/m  Obese 44 year old woman in no acute distress Alert and oriented x3 with no focal deficits Cardiac regular rate and rhythm, normal S1 and S2 Lungs diminished breath sounds at bases but otherwise clear Sternum stable, incision healing well Chest tube site on left open with some exudate Lower extremity edema bilaterally.  Leg and wrist incisions okay.  Diagnostic Tests: CHEST - 2 VIEW  COMPARISON:  07/08/2019 and earlier.  FINDINGS: Larger lung volumes. Mediastinal contours remain normal. Prior CABG. Visualized tracheal air column is within normal limits. Both lungs appear clear. No pleural effusion. No acute  osseous abnormality identified. Negative visible bowel gas pattern.  IMPRESSION: No acute cardiopulmonary abnormality status post CABG.   Electronically Signed   By: Genevie Ann M.D.   On: 07/15/2019 10:50   Impression: Beverly [name] is a 44 year old woman with known coronary disease and multiple cardiac risk factors.  She has a history of hypertension, hyperlipidemia, morbid obesity, and insulin-dependent diabetes.  She presented with unstable chest pain and was found to have severe three-vessel coronary disease.  She underwent coronary artery bypass grafting x3 on 06/19/2019.  She has not had any recurrent anginal pain.  Her primary complaint is right arm pain which extends from around the shoulder down into the hand.  She also has some numbness in her hand.  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  She is on gabapentin 300 mg nightly.  I recommended that she increase that to 3 times a day.  Also getting give her a prescription for oxycodone to see if that will help with the pain.  She should not lift anything over 10 pounds until she sees me back in the office.  She should not drive until she sees me back in the office and is off the oxycodone.  Plan: Increase gabapentin to 300 mg 3 times daily Oxycodone 5 mg tablets 1 every 6 hours as needed for pain, 30 tablets, no refills Return in 4 weeks  Melrose Nakayama, MD [facility] [phone]

## 2019-07-29 ENCOUNTER — Ambulatory Visit (INDEPENDENT_AMBULATORY_CARE_PROVIDER_SITE_OTHER): Payer: Self-pay | Admitting: Thoracic Surgery (Cardiothoracic Vascular Surgery)

## 2019-07-29 ENCOUNTER — Ambulatory Visit (HOSPITAL_COMMUNITY)
Admission: RE | Admit: 2019-07-29 | Discharge: 2019-07-29 | Disposition: A | Discharge status: Home / Self Care | Payer: 59 | Source: Ambulatory Visit | Attending: Thoracic Surgery (Cardiothoracic Vascular Surgery) | Admitting: Thoracic Surgery (Cardiothoracic Vascular Surgery)

## 2019-07-29 ENCOUNTER — Other Ambulatory Visit: Payer: Self-pay | Admitting: Thoracic Surgery (Cardiothoracic Vascular Surgery)

## 2019-07-29 ENCOUNTER — Other Ambulatory Visit: Payer: Self-pay

## 2019-07-29 VITALS — BP 134/70 | HR 100 | Temp 97.7°F | Resp 20 | Ht 60.0 in | Wt 240.0 lb

## 2019-07-29 DIAGNOSIS — Z951 Presence of aortocoronary bypass graft: Secondary | ICD-10-CM | POA: Diagnosis present

## 2019-07-29 DIAGNOSIS — I2510 Atherosclerotic heart disease of native coronary artery without angina pectoris: Secondary | ICD-10-CM

## 2019-07-29 MED ORDER — ASPIRIN EC 81 MG PO TBEC: 81.0000 mg | DELAYED_RELEASE_TABLET | Freq: Every day | ORAL | 6 refills | Status: AC

## 2019-07-29 MED ORDER — APIXABAN 5 MG PO TABS: ORAL_TABLET | ORAL | 1 refills | Status: DC

## 2019-07-29 MED ORDER — DICLOXACILLIN SODIUM 500 MG PO CAPS: 500.0000 mg | ORAL_CAPSULE | Freq: Three times a day (TID) | ORAL | 7 refills | Status: DC

## 2019-07-29 NOTE — Progress Notes (Signed)
Left lower extremity venous duplex completed. Refer to "CV Proc" under chart review to view preliminary results.  07/29/2019 4:33 PM Eula Fried., MHA, RVT, RDCS, RDMS

## 2019-07-29 NOTE — Addendum Note (Signed)
Addended by: [name] on: 07/29/2019 04:37 PM   Modules accepted: Orders

## 2019-07-29 NOTE — Progress Notes (Addendum)
Beverly [name]       Beverly [name],Beverly [name]             [phone]      HPI: Beverly [name] returns today because of redness and swelling in her left foot  Beverly [name] is a 44 year old woman who had coronary bypass grafting on 06/19/2019.  I last saw her in the office on 07/15/2019.  She was doing well from a cardiac standpoint but was having a lot of pain in her right arm due to brachial plexopathy.  She still has some numbness in the right hand and notices that the right hand is puffy.  The pain has improved significantly.  She is no longer taking oxycodone.  Today she noticed that she had redness and swelling on the top of her left foot this extends from just below the ankle out to the toes.  There is some swelling in her leg that has been there since the surgery, but this is new.  She also has some pain in the left calf.  No fevers or chills  Past Medical History:  Diagnosis Date  . CAD (coronary artery disease) 06/16/2019  . Diabetes mellitus without complication ([place])   . Hypertension   . Multiple thyroid nodules   . Obesity     Current Outpatient Medications  Medication Sig Dispense Refill  . acetaminophen (TYLENOL) 500 MG tablet Take 2 tablets (1,000 mg total) by mouth every 6 (six) hours as needed. 30 tablet 0  . aluminum-magnesium hydroxide-simethicone (MAALOX) 503-546-56 MG/5ML SUSP Take 30 mLs by mouth 3 (three) times daily as needed (for gas/indigestion).     [name] aspirin EC 325 MG EC tablet Take 1 tablet (325 mg total) by mouth daily. 30 tablet 0  . atorvastatin (LIPITOR) 80 MG tablet Take 1 tablet (80 mg total) by mouth daily at 6 PM. 30 tablet 3  . carvedilol (COREG) 12.5 MG tablet Take 12.5 mg by mouth in the morning.     . diphenoxylate-atropine (LOMOTIL) 2.5-0.025 MG tablet Take 1-2 tablets by mouth 4 (four) times daily as needed for diarrhea or loose stools. 90 tablet 0  . furosemide (LASIX) 40 MG tablet Take 1 tablet (40 mg total) by mouth daily. 14  tablet 0  . gabapentin (NEURONTIN) 300 MG capsule Take 1 capsule (300 mg total) by mouth 3 (three) times daily. 90 capsule 2  . Insulin Human (INSULIN PUMP) SOLN Inject 1 each into the skin continuous. MedTronic- Uses Humalog    . insulin lispro (HUMALOG) 100 UNIT/ML injection Inject into the skin See admin instructions. For use in MedTronic insulin pump    . losartan (COZAAR) 100 MG tablet Take 100 mg by mouth daily.     . Multiple Vitamins-Calcium (ONE-A-DAY WOMENS FORMULA) TABS Take 1 tablet by mouth daily with breakfast.    . oxyCODONE (OXY IR/ROXICODONE) 5 MG immediate release tablet Take 1 tablet (5 mg total) by mouth every 4 (four) hours as needed for severe pain. 30 tablet 0  . simethicone (GAS-X EXTRA STRENGTH) 125 MG chewable tablet Chew 125 mg by mouth every 6 (six) hours as needed for flatulence.    . dicloxacillin (DYNAPEN) 500 MG capsule Take 1 capsule (500 mg total) by mouth 3 (three) times daily. 21 capsule 7   No current facility-administered medications for this visit.    Physical Exam BP 134/70   Pulse 100   Temp 97.7 F (36.5 C) (Skin)   Resp 20  Ht 5' (1.524 m)   Wt 240 lb (108.9 kg)   SpO2 99% Comment: RA  BMI 46.22 kg/m  44 year old woman in no acute distress Alert and oriented x3 with no focal deficits Cardiac regular rate and rhythm Lungs clear Extremities-erythema and induration dorsum of left foot.  Mildly tender and warm to touch.  1+ edema on left, positive calf tenderness to palpation.  Questionable Homans sign.   Impression: Beverly [name] is a 44 year old woman who had coronary bypass grafting on 06/19/2019.  She developed redness and swelling on the dorsum of her left foot over the past day.  Findings are consistent with cellulitis.  I am going to start her on dicloxacillin 500 mg 3 times daily.  She has some mild calf tenderness.  She is hard to get a good read on pain wise.  I think we need to do a duplex to make sure she does not have a DVT.  Her  right arm pain is improved although she does still have some numbness and a sensation of swelling in the right hand.  She is no longer using oxycodone.  Her sensation should improve her hand does not look any different than the other side so I think that is mostly a sensation issue and will improve as the numbness resolves.  Plan: Dicloxacillin 500 mg 3 times daily for 7 days Duplex to rule out DVT Follow-up with Dr. Bing Matter as scheduled Return as scheduled on 08/12/2019  [name], MD [facility] [phone]  I received a call from the [facility] vascular lab.  Beverly [name] has a below-knee peroneal DVT.  Will start on Eliquis.  Decrease aspirin to 81 mg daily.  Return in 2 weeks as scheduled.  Salvatore Decent [name], MD [facility] [phone]

## 2019-08-07 ENCOUNTER — Other Ambulatory Visit: Payer: Self-pay

## 2019-08-07 ENCOUNTER — Encounter: Payer: Self-pay | Admitting: Cardiology

## 2019-08-07 ENCOUNTER — Ambulatory Visit (INDEPENDENT_AMBULATORY_CARE_PROVIDER_SITE_OTHER): Payer: 59 | Admitting: Cardiology

## 2019-08-07 VITALS — BP 130/72 | HR 89 | Temp 97.2°F | Ht 60.0 in | Wt 246.0 lb

## 2019-08-07 DIAGNOSIS — Z9884 Bariatric surgery status: Secondary | ICD-10-CM

## 2019-08-07 DIAGNOSIS — I10 Essential (primary) hypertension: Secondary | ICD-10-CM

## 2019-08-07 DIAGNOSIS — Z86718 Personal history of other venous thrombosis and embolism: Secondary | ICD-10-CM | POA: Diagnosis not present

## 2019-08-07 DIAGNOSIS — E785 Hyperlipidemia, unspecified: Secondary | ICD-10-CM

## 2019-08-07 DIAGNOSIS — E876 Hypokalemia: Secondary | ICD-10-CM

## 2019-08-07 DIAGNOSIS — E1169 Type 2 diabetes mellitus with other specified complication: Secondary | ICD-10-CM

## 2019-08-07 DIAGNOSIS — Z951 Presence of aortocoronary bypass graft: Secondary | ICD-10-CM | POA: Diagnosis not present

## 2019-08-07 NOTE — Progress Notes (Signed)
Cardiology Office Note:    Date:  08/07/2019   ID:  Beverly [name], DOB 1975-05-28, MRN [id number]  PCP:  [name], MD  Cardiologist:  [name], MD    Referring MD: [name], MD   Chief Complaint  Patient presents with  . Follow-up    1 MO FU   I had a bypass surgery  History of Present Illness:    Beverly [name] is a 44 y.o. female past medical history significant for poorly controlled diabetes, essential hypertension, dyslipidemia, morbid obesity, status post gastric bypass surgery years ago, status post recent coronary artery bypass graft.  She also had history of stenting done in 2009.  After that no follow-up.  She came to hospital because of intermittent chest pain cardiac catheterization has been done and then she ended up having coronary artery bypass graft with LIMA to LAD SVG to ramus intermediate, SVG to PDA that was done on June 19, 2019.  Recovery was complicated by DVT.  She is being on anticoagulation for that.  She comes today to our office she would like to be reestablished as a patient. Overall she is doing fair she complained of being weak tired and exhausted.  Denies have any chest pain tightness squeezing pressure burning chest except as an usual pain after bypass surgery.  She is getting ready to participate in rehab which I think will be tremendously beneficial for her.  Her wound seems to be healing well.  She does have DVT in the left calf and she is on anticoagulation for that but overall ankle swelling but now is getting better.  Past Medical History:  Diagnosis Date  . CAD (coronary artery disease) 06/16/2019  . Diabetes mellitus without complication (HCC)   . Hypertension   . Multiple thyroid nodules   . Nephrolithiasis 07/08/2012  . Obesity     Past Surgical History:  Procedure Laterality Date  . APPLICATION OF WOUND VAC N/A 06/19/2019   Procedure: Application Of Wound Vac USING PREVENA INCISIONAL DRESSING;  Surgeon: [name], MD;  Location: [facility] OR;  Service: Open Heart Surgery;  Laterality: N/A;  . Cardiac Stent  2009  . CORONARY ARTERY BYPASS GRAFT N/A 06/19/2019   Procedure: CORONARY ARTERY BYPASS GRAFTING (CABG) x 3 WITH ENDOSCOPIC HARVESTING OF LEFT GREATER SAPHENOUS VEIN. LIMA TO LAD, SVG TO PD, SVG TO RAMUS;  Surgeon: [name], MD;  Location: [facility] OR;  Service: Open Heart Surgery;  Laterality: N/A;  . LEFT HEART CATH AND CORONARY ANGIOGRAPHY N/A 06/18/2019   Procedure: LEFT HEART CATH AND CORONARY ANGIOGRAPHY;  Surgeon: [name], MD;  Location: [facility] INVASIVE CV LAB;  Service: Cardiovascular;  Laterality: N/A;  . TEE WITHOUT CARDIOVERSION N/A 06/19/2019   Procedure: TRANSESOPHAGEAL ECHOCARDIOGRAM (TEE);  Surgeon: [name], MD;  Location: [facility] OR;  Service: Open Heart Surgery;  Laterality: N/A;    Current Medications: Current Meds  Medication Sig  . acetaminophen (TYLENOL) 500 MG tablet Take 2 tablets (1,000 mg total) by mouth every 6 (six) hours as needed.  [name] aluminum-magnesium hydroxide-simethicone (MAALOX) 200-200-20 MG/5ML SUSP Take 30 mLs by mouth 3 (three) times daily as needed (for gas/indigestion).   [name] apixaban (ELIQUIS) 5 MG TABS tablet Take 2 tablets (10mg ) twice daily for 7 days, then 1 tablet (5mg ) twice daily  . aspirin EC 81 MG tablet Take 1 tablet (81 mg total) by mouth daily.  atorvastatin (LIPITOR) 80 MG tablet Take 1 tablet (80 mg total)  by mouth daily at 6 PM.  . carvedilol (COREG) 12.5 MG tablet Take 12.5 mg by mouth in the morning.   . dicloxacillin (DYNAPEN) 500 MG capsule Take 1 capsule (500 mg total) by mouth 3 (three) times daily.  . diphenoxylate-atropine (LOMOTIL) 2.5-0.025 MG tablet Take 1-2 tablets by mouth 4 (four) times daily as needed for diarrhea or loose stools.  . furosemide (LASIX) 40 MG tablet Take 1 tablet (40 mg total) by mouth daily.  [name] gabapentin (NEURONTIN) 300 MG capsule Take 1 capsule (300 mg total) by mouth 3 (three) times daily.   . Insulin Human (INSULIN PUMP) SOLN Inject 1 each into the skin continuous. MedTronic- Uses Humalog  . insulin lispro (HUMALOG) 100 UNIT/ML injection Inject into the skin See admin instructions. For use in MedTronic insulin pump  . losartan (COZAAR) 100 MG tablet Take 100 mg by mouth daily.   . Multiple Vitamins-Calcium (ONE-A-DAY WOMENS FORMULA) TABS Take 1 tablet by mouth daily with breakfast.  . simethicone (GAS-X EXTRA STRENGTH) 125 MG chewable tablet Chew 125 mg by mouth every 6 (six) hours as needed for flatulence.     Allergies:   Shellfish-derived products and Tape   Social History   Socioeconomic History  . Marital status: Married    Spouse name: Not on file  . Number of children: Not on file  . Years of education: Not on file  . Highest education level: Not on file  Occupational History  . Not on file  Tobacco Use  . Smoking status: Never Smoker  . Smokeless tobacco: Never Used  Substance and Sexual Activity  . Alcohol use: Never  . Drug use: Never  . Sexual activity: Yes  Other Topics Concern  . Not on file  Social History Narrative  . Not on file   Social Determinants of Health   Financial Resource Strain:   . Difficulty of Paying Living Expenses:   Food Insecurity:   . Worried About Programme researcher, broadcasting/film/video in the Last Year:   . Barista in the Last Year:   Transportation Needs:   . Freight forwarder (Medical):   [name] Lack of Transportation (Non-Medical):   Physical Activity:   . Days of Exercise per Week:   . Minutes of Exercise per Session:   Stress:   . Feeling of Stress :   Social Connections:   . Frequency of Communication with Friends and Family:   . Frequency of Social Gatherings with Friends and Family:   . Attends Religious Services:   . Active Member of Clubs or Organizations:   . Attends Banker Meetings:   [name] Marital Status:      Family History: The patient's family history includes Diabetes Mellitus II in her father  and mother; Heart disease in her father and maternal grandmother; Hypertension in her father and mother. ROS:   Please see the history of present illness.    All 14 point review of systems negative except as described per history of present illness  EKGs/Labs/Other Studies Reviewed:      Recent Labs: 06/20/2019: Magnesium 2.2 07/08/2019: BUN 17; Creatinine, Ser 0.61; Hemoglobin 10.7; Platelets 393; Potassium 3.9; Sodium 133  Recent Lipid Panel    Component Value Date/Time   CHOL 214 (H) 06/17/2019 0500   TRIG 113 06/17/2019 0500   HDL 35 (L) 06/17/2019 0500   CHOLHDL 6.1 06/17/2019 0500   VLDL 23 06/17/2019 0500   LDLCALC 156 (H) 06/17/2019 0500    Physical  Exam:    VS:  BP 130/72   Pulse 89   Temp (!) 97.2 F (36.2 C)   Ht 5' (1.524 m)   Wt 246 lb (111.6 kg)   SpO2 99%   BMI 48.04 kg/m     Wt Readings from Last 3 Encounters:  08/07/19 246 lb (111.6 kg)  07/29/19 240 lb (108.9 kg)  07/15/19 236 lb 9.6 oz (107.3 kg)     GEN:  Well nourished, well developed in no acute distress HEENT: Normal NECK: No JVD; No carotid bruits LYMPHATICS: No lymphadenopathy CARDIAC: RRR, no murmurs, no rubs, no gallops RESPIRATORY:  Clear to auscultation without rales, wheezing or rhonchi  ABDOMEN: Soft, non-tender, non-distended MUSCULOSKELETAL:  No edema; No deformity  SKIN: Warm and dry LOWER EXTREMITIES: Minimal swelling of the left calf NEUROLOGIC:  Alert and oriented x 3 PSYCHIATRIC:  Normal affect   ASSESSMENT:    1. S/P CABG x 3   2. S/P bariatric surgery   3. Hypokalemia   4. History of DVT (deep vein thrombosis)   5. Essential hypertension    PLAN:    In order of problems listed above:  1. Status post coronary bypass graft: This is a young lady with premature coronary artery disease with stenting in 2009 and now coronary artery bypass graft.  We need to do everything that we can to modify her risk factors.  There is no doubt in my mind that one of the reason for her  premature coronary artery disease is the fact that she does have poorly controlled diabetes.  On my questioning why there is the situation she said she did not comply with the diet she is to drink a lot of soda and eats whatever she wants and she did not care about her diabetes.  I think she required multispecialty approach to this problem.  Therefore, she be referred to cardiac rehab and she will have dietary evaluation as well as comprehensive evaluation and guidelines.  I will refer her to endocrinology.  She can benefit from SGLT agents like Jardiance.  She need to participate in rehab and lose some weight. 2. DVT in the left calf.  Anticoagulated which I will continue for now. 3. History of hypokalemia I will check her Chem-7 today. 4. Essential hypertension blood pressure appears to be well controlled. 5. Diabetes she already back on appropriate medication she is eating right I will check her hemoglobin A1c, she be referred to endocrinologist.   Overall is a very complicated situation.  She requires multispecialty approach and proper guidelines.   Medication Adjustments/Labs and Tests Ordered: Current medicines are reviewed at length with the patient today.  Concerns regarding medicines are outlined above.  No orders of the defined types were placed in this encounter.  Medication changes: No orders of the defined types were placed in this encounter.   Signed, Park Liter, MD, [facility] 08/07/2019 [place]

## 2019-08-07 NOTE — Patient Instructions (Signed)
Medication Instructions:  Your physician recommends that you continue on your current medications as directed. Please refer to the Current Medication list given to you today.  *If you need a refill on your cardiac medications before your next appointment, please call your pharmacy*   Lab Work: Your physician recommends that you return for lab work today: lipid, cbc, a1c, bmp   If you have labs (blood work) drawn today and your tests are completely normal, you will receive your results only by: [name] MyChart Message (if you have MyChart) OR . A paper copy in the mail If you have any lab test that is abnormal or we need to change your treatment, we will call you to review the results.   Testing/Procedures: None.    Follow-Up: At [facility], you and your health needs are our priority.  As part of our continuing mission to provide you with exceptional heart care, we have created designated Provider Care Teams.  These Care Teams include your primary Cardiologist (physician) and Advanced Practice Providers (APPs -  Physician Assistants and Nurse Practitioners) who all work together to provide you with the care you need, when you need it.  We recommend signing up for the patient portal called "MyChart".  Sign up information is provided on this After Visit Summary.  MyChart is used to connect with patients for Virtual Visits (Telemedicine).  Patients are able to view lab/test results, encounter notes, upcoming appointments, etc.  Non-urgent messages can be sent to your provider as well.   To learn more about what you can do with MyChart, go to [email or web address].    Your next appointment:   1 month(s)  The format for your next appointment:   In Person  Provider:   [name], MD   Other Instructions   Dr. Bing Matter has referred you to see endocrinology they should call you within 1 wee if not please call our office.

## 2019-08-08 ENCOUNTER — Telehealth: Payer: Self-pay | Admitting: Cardiology

## 2019-08-08 LAB — CBC
Hematocrit: 35.5 % (ref 34.0–46.6)
Hemoglobin: 12.1 g/dL (ref 11.1–15.9)
MCH: 29.7 pg (ref 26.6–33.0)
MCHC: 34.1 g/dL (ref 31.5–35.7)
MCV: 87 fL (ref 79–97)
Platelets: 310 10*3/uL (ref 150–450)
RBC: 4.08 x10E6/uL (ref 3.77–5.28)
RDW: 12.5 % (ref 11.7–15.4)
WBC: 8.2 10*3/uL (ref 3.4–10.8)

## 2019-08-08 LAB — BASIC METABOLIC PANEL
BUN/Creatinine Ratio: 22 (ref 9–23)
BUN: 12 mg/dL (ref 6–24)
CO2: 25 mmol/L (ref 20–29)
Calcium: 9.2 mg/dL (ref 8.7–10.2)
Chloride: 101 mmol/L (ref 96–106)
Creatinine, Ser: 0.55 mg/dL — ABNORMAL LOW (ref 0.57–1.00)
GFR calc Af Amer: 132 mL/min/{1.73_m2} (ref >59)
GFR calc non Af Amer: 114 mL/min/{1.73_m2} (ref >59)
Glucose: 92 mg/dL (ref 65–99)
Potassium: 4.4 mmol/L (ref 3.5–5.2)
Sodium: 141 mmol/L (ref 134–144)

## 2019-08-08 LAB — HEMOGLOBIN A1C
Est. average glucose Bld gHb Est-mCnc: 192 mg/dL
Hgb A1c MFr Bld: 8.3 % — ABNORMAL HIGH (ref 4.8–5.6)

## 2019-08-08 LAB — LIPID PANEL
Chol/HDL Ratio: 2.8 ratio (ref 0.0–4.4)
Cholesterol, Total: 139 mg/dL (ref 100–199)
HDL: 50 mg/dL (ref >39)
LDL Chol Calc (NIH): 73 mg/dL (ref 0–99)
Triglycerides: 82 mg/dL (ref 0–149)
VLDL Cholesterol Cal: 16 mg/dL (ref 5–40)

## 2019-08-08 NOTE — Telephone Encounter (Signed)
New message ° ° °Patient is returning call for lab results. Please call. °

## 2019-08-08 NOTE — Telephone Encounter (Signed)
Spoke with patient regarding results and recommendation.  Patient verbalizes understanding and is agreeable to plan of care. Advised patient to call back with any issues or concerns.  

## 2019-08-12 ENCOUNTER — Ambulatory Visit (INDEPENDENT_AMBULATORY_CARE_PROVIDER_SITE_OTHER): Payer: Self-pay | Admitting: Thoracic Surgery (Cardiothoracic Vascular Surgery)

## 2019-08-12 ENCOUNTER — Encounter: Payer: Self-pay | Admitting: Thoracic Surgery (Cardiothoracic Vascular Surgery)

## 2019-08-12 ENCOUNTER — Other Ambulatory Visit: Payer: Self-pay

## 2019-08-12 VITALS — BP 138/83 | HR 94 | Temp 97.7°F | Resp 16 | Ht 60.0 in | Wt 246.0 lb

## 2019-08-12 DIAGNOSIS — Z951 Presence of aortocoronary bypass graft: Secondary | ICD-10-CM

## 2019-08-12 DIAGNOSIS — I82492 Acute embolism and thrombosis of other specified deep vein of left lower extremity: Secondary | ICD-10-CM

## 2019-08-12 DIAGNOSIS — I2510 Atherosclerotic heart disease of native coronary artery without angina pectoris: Secondary | ICD-10-CM

## 2019-08-12 NOTE — Progress Notes (Signed)
[address]       Beverly [name] [address]             [phone]     HPI: Beverly [name] returns for scheduled follow-up  Beverly [name] is a 44 year old woman with a history of hypertension, hyperlipidemia, insulin-dependent diabetes, morbid obesity, and coronary disease dating back to 2009.  She presented with unstable chest pain and ruled out for MI.  At catheterization she had severe three-vessel disease.  She underwent coronary artery bypass grafting x3 on 06/19/2019.  She went home on day 5.  Saw her back in the office in April and she was having a lot of right arm pain.  She also had some numbness in the hand.  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  She returned on 07/29/2019 with a complaint of some swelling and redness in her left foot.  This looked like a cellulitis and she was treated with dicloxacillin.  We did a duplex to look for DVT and she did have a below-knee peroneal DVT on the left.  She was started on Eliquis.  Her aspirin was decreased to 81 mg daily.  Since then the pain and swelling in her foot have improved.  She does still have some swelling.  Her sternal incision is doing well with minimal discomfort there.  She does still have some pain and swelling and numbness in the right arm and hand.  Past Medical History:  Diagnosis Date  . CAD (coronary artery disease) 06/16/2019  . Diabetes mellitus without complication (HCC)   . Hypertension   . Multiple thyroid nodules   . Nephrolithiasis 07/08/2012  . Obesity     Current Outpatient Medications  Medication Sig Dispense Refill  . acetaminophen (TYLENOL) 500 MG tablet Take 2 tablets (1,000 mg total) by mouth every 6 (six) hours as needed. 30 tablet 0  . aluminum-magnesium hydroxide-simethicone (MAALOX) 200-200-20 MG/5ML SUSP Take 30 mLs by mouth 3 (three) times daily as needed (for gas/indigestion).     [name] apixaban (ELIQUIS) 5 MG TABS tablet Take 2 tablets (10mg ) twice daily for 7 days, then 1  tablet (5mg ) twice daily 60 tablet 1  . aspirin EC 81 MG tablet Take 1 tablet (81 mg total) by mouth daily. 30 tablet 6  . atorvastatin (LIPITOR) 80 MG tablet Take 1 tablet (80 mg total) by mouth daily at 6 PM. 30 tablet 3  . carvedilol (COREG) 12.5 MG tablet Take 12.5 mg by mouth in the morning.     . diphenoxylate-atropine (LOMOTIL) 2.5-0.025 MG tablet Take 1-2 tablets by mouth 4 (four) times daily as needed for diarrhea or loose stools. 90 tablet 0  . furosemide (LASIX) 40 MG tablet Take 1 tablet (40 mg total) by mouth daily. 14 tablet 0  . gabapentin (NEURONTIN) 300 MG capsule Take 1 capsule (300 mg total) by mouth 3 (three) times daily. 90 capsule 2  . Insulin Human (INSULIN PUMP) SOLN Inject 1 each into the skin continuous. MedTronic- Uses Humalog    . insulin lispro (HUMALOG) 100 UNIT/ML injection Inject into the skin See admin instructions. For use in MedTronic insulin pump    . losartan (COZAAR) 100 MG tablet Take 100 mg by mouth daily.     . Multiple Vitamins-Calcium (ONE-A-DAY WOMENS FORMULA) TABS Take 1 tablet by mouth daily with breakfast.    . simethicone (GAS-X EXTRA STRENGTH) 125 MG chewable tablet Chew 125 mg by mouth every 6 (six) hours as needed for flatulence.  No current facility-administered medications for this visit.    Physical Exam BP 138/83 (BP Location: Left Arm, Patient Position: Sitting, Cuff Size: Large)   Pulse 94   Temp 97.7 F (36.5 C)   Resp 16   Ht 5' (1.524 m)   Wt 246 lb (111.6 kg)   SpO2 98% Comment: RA  BMI 48.04 kg/m  Obese 44 year old woman in no acute distress Alert and oriented x3 with no focal deficits Lungs clear bilaterally Cardiac regular rate and rhythm Trace edema both feet Sternum stable, incision well-healed  Diagnostic Tests: none  Impression: Beverly [name] is a 44 year old woman with a history of hypertension, hyperlipidemia, insulin-dependent diabetes, morbid obesity, and coronary disease dating back to 2009.  She  presented with chest pain and had three-vessel coronary disease.  She underwent coronary bypass grafting on 06/19/2019.  Cellulitis-left foot-resolved with dicloxacillin  Below-knee peroneal DVT-treated with Eliquis.  Pain and swelling have improved.  We will plan to continue that for 6 months.  Brachial plexopathy-she asked about a cortisone shot in her hand.  I advised her against that.  Her symptoms have already improved and should continue to improve over the next several months.  I gave her some exercises that she can do to help maintain grip strength and shoulder mobility.  She has anxious to begin driving.  She may do so.  She also is anxious to return to work and I told her she can go back to that as well.  I did recommend that she do half days for the first 2 weeks.  Plan: Follow-up as scheduled with Dr. Agustin Cree Return in 51months for follow-up to consider discontinuing Eliquis.  Melrose Nakayama, MD [facility] [phone]

## 2019-08-18 ENCOUNTER — Ambulatory Visit: Payer: 59 | Admitting: Cardiology

## 2019-09-11 ENCOUNTER — Ambulatory Visit: Payer: 59 | Admitting: Cardiology

## 2019-09-22 ENCOUNTER — Other Ambulatory Visit: Payer: Self-pay | Admitting: Thoracic Surgery (Cardiothoracic Vascular Surgery)

## 2019-09-22 ENCOUNTER — Encounter: Payer: Self-pay | Admitting: Internal Medicine

## 2019-09-22 ENCOUNTER — Other Ambulatory Visit: Payer: Self-pay

## 2019-09-22 ENCOUNTER — Ambulatory Visit: Payer: 59 | Admitting: Internal Medicine

## 2019-09-22 VITALS — BP 114/62 | HR 89 | Ht 60.0 in | Wt 238.4 lb

## 2019-09-22 DIAGNOSIS — E1142 Type 2 diabetes mellitus with diabetic polyneuropathy: Secondary | ICD-10-CM

## 2019-09-22 DIAGNOSIS — Z794 Long term (current) use of insulin: Secondary | ICD-10-CM

## 2019-09-22 DIAGNOSIS — E113599 Type 2 diabetes mellitus with proliferative diabetic retinopathy without macular edema, unspecified eye: Secondary | ICD-10-CM

## 2019-09-22 DIAGNOSIS — E1159 Type 2 diabetes mellitus with other circulatory complications: Secondary | ICD-10-CM

## 2019-09-22 DIAGNOSIS — E1165 Type 2 diabetes mellitus with hyperglycemia: Secondary | ICD-10-CM | POA: Diagnosis not present

## 2019-09-22 DIAGNOSIS — E785 Hyperlipidemia, unspecified: Secondary | ICD-10-CM

## 2019-09-22 MED ORDER — INSULIN PEN NEEDLE 29G X 5MM MISC: 1.0000 | 11 refills | Status: DC

## 2019-09-22 MED ORDER — INSULIN LISPRO (1 UNIT DIAL) 100 UNIT/ML (KWIKPEN): 12.0000 [IU] | PEN_INJECTOR | Freq: Three times a day (TID) | SUBCUTANEOUS | 11 refills | Status: AC

## 2019-09-22 MED ORDER — DAPAGLIFLOZIN PROPANEDIOL 5 MG PO TABS: 5.0000 mg | ORAL_TABLET | Freq: Every day | ORAL | 6 refills | Status: DC

## 2019-09-22 MED ORDER — LANTUS SOLOSTAR 100 UNIT/ML ~~LOC~~ SOPN: 50.0000 [IU] | PEN_INJECTOR | Freq: Every day | SUBCUTANEOUS | 11 refills | Status: AC

## 2019-09-22 NOTE — Patient Instructions (Addendum)
-   Start Farxiga 5 mg , 1 tablet with Breakfast  - STOP the insulin pump  - Start Lantus 50 units ONCE daily  - Humalog 12 units with each meal      HOW TO TREAT LOW BLOOD SUGARS (Blood sugar LESS THAN 70 MG/DL)  Please follow the RULE OF 15 for the treatment of hypoglycemia treatment (when your (blood sugars are less than 70 mg/dL)    STEP 1: Take 15 grams of carbohydrates when your blood sugar is low, which includes:   3-4 GLUCOSE TABS  OR  3-4 OZ OF JUICE OR REGULAR SODA OR  ONE TUBE OF GLUCOSE GEL     STEP 2: RECHECK blood sugar in 15 MINUTES STEP 3: If your blood sugar is still low at the 15 minute recheck --> then, go back to STEP 1 and treat AGAIN with another 15 grams of carbohydrates.

## 2019-09-22 NOTE — Progress Notes (Signed)
Name: Beverly [name]  MRN/ DOB: [id number], 06/18/1975   Age/ Sex: 44 y.o., female    PCP: [name], MD   Reason for Endocrinology Evaluation: Type 2 Diabetes Mellitus     Date of Initial Endocrinology Visit: 09/23/2019     PATIENT IDENTIFIER: Beverly [name] is a 44 y.o. female with a past medical history of DM, HTN, Dyslipidemia and CAD , S/P bariatric sx. The patient presented for initial endocrinology clinic visit on 09/23/2019 for consultative assistance with her diabetes management.    HPI: Ms. Swanton was    Diagnosed with T2DM  In 2001 Prior Medications tried/Intolerance: Metformin- diarrhea, Januvia- no intolerance . Ozempic - no intolerance, has been on the pump since 2014 Currently checking blood sugars 2 x / day,  before breakfast Hypoglycemia episodes : yes              Symptoms: yes               Frequency: rarely  Hemoglobin A1c has ranged from 8.3% in 2021, peaking at 10.2% in 2021. Patient required assistance for hypoglycemia: no Patient has required hospitalization within the last 1 year from hyper or hypoglycemia: no  In terms of diet, the patient eats 3 meals a day, snacks 2x a day. Drinks occasional sugar-sweetened beverages     S/P gastric sleeve in 01/2014 ,lost ~ 70 lbs  But has gained ~ 25  This patient with type 2 diabetes is treated with Medtrocni (insulin pump). During the visit the pump basal and bolus doses were reviewed including carb/insulin rations and supplemental doses. The clinical list was updated. The glucose meter download was reviewed in detail to determine if the current pump settings are providing the best glycemic control without excessive hypoglycemia.  Pump and meter download:    Pump   MEDTRONIC   Settings   Insulin type   NOVOLOG    Basal rate       0000-0200  0.8 u/h    0200-1000  1.5 u/h    1000-0000  0.8 u/h       I:C ratio       0000-0600  1:8    0600-1000  1:5    1000-2000  1:7    2000-0000  1:8         Sensitivity       0000  35       Goal       0000  70-110             Type & Model of Pump: Medtronic Insulin Type: Currently using Humalog    PUMP STATISTICS: There has  Only been 3 readings in the past month of pump download      HOME DIABETES REGIMEN: Humalog     Statin: yes ACE-I/ARB: Yes Prior Diabetic Education:yes   METER DOWNLOAD SUMMARY: Did not bring    DIABETIC COMPLICATIONS: Microvascular complications:   Retinopathy (s/p laser) ,neuropathy   Denies: CKD  Last eye exam: Completed 2019  Macrovascular complications:   CAD (S/P CABG 06/19/2019)  Denies: PVD, CVA   PAST HISTORY: Past Medical History:  Past Medical History:  Diagnosis Date  . CAD (coronary artery disease) 06/16/2019  . Diabetes mellitus without complication (HCC)   . Hypertension   . Multiple thyroid nodules   . Nephrolithiasis 07/08/2012  . Obesity    Past Surgical History:  Past Surgical History:  Procedure Laterality Date  . APPLICATION OF WOUND VAC N/A 06/19/2019  Procedure: Application Of Wound Vac USING PREVENA INCISIONAL DRESSING;  Surgeon: [name], MD;  [facility];  Service: Open Heart Surgery;  Laterality: N/A;  . Cardiac Stent  2009  . CORONARY ARTERY BYPASS GRAFT N/A 06/19/2019   Procedure: CORONARY ARTERY BYPASS GRAFTING (CABG) x 3 WITH ENDOSCOPIC HARVESTING OF LEFT GREATER SAPHENOUS VEIN. LIMA TO LAD, SVG TO PD, SVG TO RAMUS;  Surgeon: [name], MD;  Location: [facility] OR;  Service: Open Heart Surgery;  Laterality: N/A;  . LEFT HEART CATH AND CORONARY ANGIOGRAPHY N/A 06/18/2019   Procedure: LEFT HEART CATH AND CORONARY ANGIOGRAPHY;  Surgeon: [name], MD;  Location: [facility] INVASIVE CV LAB;  Service: Cardiovascular;  Laterality: N/A;  . TEE WITHOUT CARDIOVERSION N/A 06/19/2019   Procedure: TRANSESOPHAGEAL ECHOCARDIOGRAM (TEE);  Surgeon: [name], MD;  Location: [facility] OR;  Service: Open Heart Surgery;  Laterality: N/A;       Social History:  reports that she has never smoked. She has never used smokeless tobacco. She reports that she does not drink alcohol and does not use drugs. Family History:  Family History  Problem Relation Age of Onset  . Hypertension Mother   . Diabetes Mellitus II Mother   . Hypertension Father   . Diabetes Mellitus II Father   . Heart disease Father        ?pacemaker vs loop recorder?  . Heart disease Maternal Grandmother        CABGx 4     HOME MEDICATIONS: Allergies as of 09/22/2019      Reactions   Shellfish-derived Products Diarrhea   Tape Other (See Comments)   Can blister the skin      Medication List       Accurate as of September 22, 2019 11:59 PM. If you have any questions, ask your nurse or doctor.        STOP taking these medications   HumaLOG 100 UNIT/ML injection Generic drug: insulin lispro Replaced by: insulin lispro 100 UNIT/ML KwikPen Stopped by: Scarlette Shorts, MD   insulin pump Soln Stopped by: Scarlette Shorts, MD     TAKE these medications   acetaminophen 500 MG tablet Commonly known as: TYLENOL Take 2 tablets (1,000 mg total) by mouth every 6 (six) hours as needed.   aluminum-magnesium hydroxide-simethicone 200-200-20 MG/5ML Susp Commonly known as: MAALOX Take 30 mLs by mouth 3 (three) times daily as needed (for gas/indigestion).   apixaban 5 MG Tabs tablet Commonly known as: Eliquis Take 2 tablets (10mg ) twice daily for 7 days, then 1 tablet (5mg ) twice daily   aspirin EC 81 MG tablet Take 1 tablet (81 mg total) by mouth daily.   atorvastatin 80 MG tablet Commonly known as: LIPITOR Take 1 tablet (80 mg total) by mouth daily at 6 PM.   carvedilol 12.5 MG tablet Commonly known as: COREG Take 12.5 mg by mouth in the morning.   dapagliflozin propanediol 5 MG Tabs tablet Commonly known as: Farxiga Take 1 tablet (5 mg total) by mouth daily. Started by: , MD   diphenoxylate-atropine 2.5-0.025 MG  tablet Commonly known as: LOMOTIL Take 1-2 tablets by mouth 4 (four) times daily as needed for diarrhea or loose stools.   furosemide 40 MG tablet Commonly known as: LASIX Take 1 tablet (40 mg total) by mouth daily.   gabapentin 300 MG capsule Commonly known as: NEURONTIN Take 1 capsule (300 mg total) by mouth 3 (three) times daily.   Gas-X Extra Strength  125 MG chewable tablet Generic drug: simethicone Chew 125 mg by mouth every 6 (six) hours as needed for flatulence.   insulin lispro 100 UNIT/ML KwikPen Commonly known as: HumaLOG KwikPen Inject 0.12 mLs (12 Units total) into the skin 3 (three) times daily. Replaces: HumaLOG 100 UNIT/ML injection Started by: Dorita Sciara, MD   Insulin Pen Needle 29G X 5MM Misc 1 Device by Does not apply route as directed. Started by: Dorita Sciara, MD   Lantus SoloStar 100 UNIT/ML Solostar Pen Generic drug: insulin glargine Inject 50 Units into the skin daily. Started by: Dorita Sciara, MD   losartan 100 MG tablet Commonly known as: COZAAR Take 100 mg by mouth daily.   One-A-Day Womens Formula Tabs Take 1 tablet by mouth daily with breakfast.        ALLERGIES: Allergies  Allergen Reactions  . Shellfish-Derived Products Diarrhea  . Tape Other (See Comments)    Can blister the skin     REVIEW OF SYSTEMS: A comprehensive ROS was conducted with the patient and is negative except as per HPI   OBJECTIVE:   VITAL SIGNS: BP 114/62 (BP Location: Left Arm, Patient Position: Sitting, Cuff Size: Large)   Pulse 89   Ht 5' (1.524 m)   Wt 238 lb 6.4 oz (108.1 kg)   SpO2 98%   BMI 46.56 kg/m    PHYSICAL EXAM:  General: Pt appears well and is in NAD  HEENT:  Eyes: External eye exam normal without stare, lid lag or exophthalmos.  EOM intact.    Neck: General: Supple without adenopathy or carotid bruits. Thyroid: Thyroid size normal.  No goiter or nodules appreciated. No thyroid bruit.  Lungs: Clear with good  BS bilat with no rales, rhonchi, or wheezes  Heart: RRR with normal S1 and S2 and no gallops; no murmurs; no rub  Abdomen: Normoactive bowel sounds, soft, nontender, without masses or organomegaly palpable  Extremities:  Lower extremities - No pretibial edema. No lesions.  Skin: Normal texture and temperature to palpation.   Neuro: MS is good with appropriate affect, pt is alert and Ox3    DM foot exam: 09/22/2019  The skin of the feet is intact without sores or ulcerations. The pedal pulses are 2+ on right and 2+ on left. The sensation is intact to a screening 5.07, 10 gram monofilament bilaterally   DATA REVIEWED:  Lab Results  Component Value Date   HGBA1C 8.3 (H) 08/07/2019   HGBA1C 10.2 (H) 06/17/2019   Lab Results  Component Value Date   LDLCALC 73 08/07/2019   CREATININE 0.55 (L) 08/07/2019    Lab Results  Component Value Date   CHOL 139 08/07/2019   HDL 50 08/07/2019   LDLCALC 73 08/07/2019   TRIG 82 08/07/2019   CHOLHDL 2.8 08/07/2019        ASSESSMENT / PLAN / RECOMMENDATIONS:   1) Type 2 Diabetes Mellitus, Poorly controlled, With retinopathic, neuropathic and macrovascular complications - Most recent A1c of  8.3 %. Goal A1c < 7.0 %.    - I have discussed with the patient the pathophysiology of diabetes. We went over the natural progression of the disease. We talked about both insulin resistance and insulin deficiency. We stressed the importance of lifestyle changes including diet and exercise. I explained the complications associated with diabetes including retinopathy, nephropathy, neuropathy as well as increased risk of cardiovascular disease. We went over the benefit seen with glycemic control.   - I explained to the patient  that diabetic patients are at higher than normal risk for amputations.  - She has been on the pump for years but is not on consistent supply of pump, hence she goes periods without using the pump . She also does not count carbs and  admits to guessing . In review of the limited data of pump download, it looks she uses the pump for basal insulin supply and the occasional correction only  - Pt opted to switch to MDI regimen at this time  - We discussed add-on therapy with SGLT-2 inhibitors especially given cardiac history, we discussed risk of genital infections      MEDICATIONS: - Start Farxiga 5 mg , 1 tablet with Breakfast  - STOP the insulin pump  - Start Lantus 50 units ONCE daily  - Humalog 12 units with each meal   EDUCATION / INSTRUCTIONS:  BG monitoring instructions: Patient is instructed to check her blood sugars 3 times a day, before meals .  Call [facility] if: BG persistently < 70 or > 300. . I reviewed the Rule of 15 for the treatment of hypoglycemia in detail with the patient. Literature supplied.   2) Diabetic complications:   Eye: Does  have known diabetic retinopathy.   Neuro/ Feet: Does  have known diabetic peripheral neuropathy.  Renal: Patient does not have known baseline CKD. She is on an ACEI/ARB at present.   3) Dyslipidemia: Patient is on 80 mg Atorvastatin. Discussed cardiovascular benefits of statins       Signed electronically by: Lyndle Herrlich, MD  [facility]  [facility] [address] [place] [place], [place] [address] Phone: [phone] FAX: [phone]   CC: [name], MD [address] [place] [address] Phone: [phone]  Fax: [phone]    Return to Endocrinology clinic as below: Future Appointments  Date Time Provider Department Center  10/02/2019  1:20 PM Georgeanna Lea, MD [facility] None  12/25/2019 10:50 AM Matilde Markie, Konrad Dolores, MD [facility] None  01/13/2020 11:00 AM [name], MD [facility]

## 2019-09-23 DIAGNOSIS — E785 Hyperlipidemia, unspecified: Secondary | ICD-10-CM

## 2019-09-23 DIAGNOSIS — Z794 Long term (current) use of insulin: Secondary | ICD-10-CM

## 2019-09-23 DIAGNOSIS — E1142 Type 2 diabetes mellitus with diabetic polyneuropathy: Secondary | ICD-10-CM

## 2019-09-23 DIAGNOSIS — E1165 Type 2 diabetes mellitus with hyperglycemia: Secondary | ICD-10-CM

## 2019-09-23 DIAGNOSIS — E119 Type 2 diabetes mellitus without complications: Secondary | ICD-10-CM | POA: Insufficient documentation

## 2019-09-23 DIAGNOSIS — E113599 Type 2 diabetes mellitus with proliferative diabetic retinopathy without macular edema, unspecified eye: Secondary | ICD-10-CM

## 2019-09-23 HISTORY — DX: Type 2 diabetes mellitus with proliferative diabetic retinopathy without macular edema, unspecified eye: E11.3599

## 2019-09-23 HISTORY — DX: Long term (current) use of insulin: E11.42

## 2019-09-23 HISTORY — DX: Type 2 diabetes mellitus with hyperglycemia: Z79.4

## 2019-09-23 HISTORY — DX: Type 2 diabetes mellitus without complications: E11.9

## 2019-09-23 HISTORY — DX: Hyperlipidemia, unspecified: E78.5

## 2019-09-23 HISTORY — DX: Type 2 diabetes mellitus with hyperglycemia: E11.65

## 2019-09-25 ENCOUNTER — Other Ambulatory Visit: Payer: Self-pay | Admitting: Thoracic Surgery (Cardiothoracic Vascular Surgery)

## 2019-09-25 ENCOUNTER — Telehealth: Payer: Self-pay | Admitting: Cardiology

## 2019-09-25 MED ORDER — APIXABAN 5 MG PO TABS: 5.0000 mg | ORAL_TABLET | Freq: Two times a day (BID) | ORAL | 3 refills | Status: DC

## 2019-09-25 NOTE — Telephone Encounter (Signed)
New message  Patient needs a new prescription for apixaban (ELIQUIS) 5 MG TABS tablet sent to [facility] - [place], [place] - [address]

## 2019-09-25 NOTE — Telephone Encounter (Signed)
Refill sent in per request.  

## 2019-10-01 ENCOUNTER — Telehealth: Payer: Self-pay | Admitting: Internal Medicine

## 2019-10-01 MED ORDER — EMPAGLIFLOZIN 10 MG PO TABS: 10.0000 mg | ORAL_TABLET | Freq: Every day | ORAL | 6 refills | Status: DC

## 2019-10-01 NOTE — Telephone Encounter (Signed)
Ok to d/c and change?

## 2019-10-01 NOTE — Telephone Encounter (Signed)
Medication RX Request  Did you call your pharmacy and request this refill first? Yes  Name of medication? Patient states insurance does not cover Marcelline Deist - covers Jardiance as an alternative and requests this to be called in instead.  Is this a 90 day supply? yes  Name and location of pharmacy?   [facility] - [place], [place] - [address]  [address] [place] [address]  Phone:  [phone] Fax:  [phone]

## 2019-10-02 ENCOUNTER — Other Ambulatory Visit: Payer: Self-pay

## 2019-10-02 ENCOUNTER — Encounter: Payer: Self-pay | Admitting: Cardiology

## 2019-10-02 ENCOUNTER — Ambulatory Visit (INDEPENDENT_AMBULATORY_CARE_PROVIDER_SITE_OTHER): Payer: 59 | Admitting: Cardiology

## 2019-10-02 VITALS — BP 126/72 | HR 85 | Ht 60.0 in | Wt 242.2 lb

## 2019-10-02 DIAGNOSIS — Z86718 Personal history of other venous thrombosis and embolism: Secondary | ICD-10-CM

## 2019-10-02 DIAGNOSIS — I2510 Atherosclerotic heart disease of native coronary artery without angina pectoris: Secondary | ICD-10-CM | POA: Diagnosis not present

## 2019-10-02 DIAGNOSIS — Z9884 Bariatric surgery status: Secondary | ICD-10-CM

## 2019-10-02 DIAGNOSIS — Z951 Presence of aortocoronary bypass graft: Secondary | ICD-10-CM | POA: Diagnosis not present

## 2019-10-02 DIAGNOSIS — E785 Hyperlipidemia, unspecified: Secondary | ICD-10-CM

## 2019-10-02 DIAGNOSIS — I10 Essential (primary) hypertension: Secondary | ICD-10-CM | POA: Diagnosis not present

## 2019-10-02 MED ORDER — EZETIMIBE 10 MG PO TABS: 10.0000 mg | ORAL_TABLET | Freq: Every day | ORAL | 1 refills | Status: DC

## 2019-10-02 NOTE — Progress Notes (Signed)
Cardiology Office Note:    Date:  10/02/2019   ID:  Beverly [name], DOB 28-Feb-1976, MRN [id number]  PCP:  [name], MD  Cardiologist:  [name], MD    Referring MD: [name], MD   No chief complaint on file. M doing well  History of Present Illness:    Beverly [name] is a 44 y.o. female   past medical history significant for poorly controlled diabetes, essential hypertension, dyslipidemia, morbid obesity, status post gastric bypass surgery years ago, status post recent coronary artery bypass graft.  She also had history of stenting done in 2009.  After that no follow-up.  She came to hospital because of intermittent chest pain cardiac catheterization has been done and then she ended up having coronary artery bypass graft with LIMA to LAD SVG to ramus intermediate, SVG to PDA that was done on June 19, 2019.  Recovery was complicated by DVT.  She is being on anticoagulation for that She comes to our office today for follow-up overall she seems to be doing well.  Few weeks ago she ended up going to [facility] because of some atypical chest pain.  I did review that record there was no EKG changes, biochemical markers were negative, she was positive for rhinovirus and she was treated as such.  Recover completely and doing well right now.  She does participate in rehab and she is enjoying it.  Gradually increasing resistance of her exercises.  Denies having any unusual shortness of breath there is no swelling of lower extremities.  She tells me that the diabetes is getting better control.  And overall she is feeling well  Past Medical History:  Diagnosis Date  . CAD (coronary artery disease) 06/16/2019  . Diabetes mellitus without complication (HCC)   . Hypertension   . Multiple thyroid nodules   . Nephrolithiasis 07/08/2012  . Obesity     Past Surgical History:  Procedure Laterality Date  . APPLICATION OF WOUND VAC N/A 06/19/2019   Procedure: Application Of  Wound Vac USING PREVENA INCISIONAL DRESSING;  Surgeon: [name], MD;  Location: [facility] OR;  Service: Open Heart Surgery;  Laterality: N/A;  . Cardiac Stent  2009  . CORONARY ARTERY BYPASS GRAFT N/A 06/19/2019   Procedure: CORONARY ARTERY BYPASS GRAFTING (CABG) x 3 WITH ENDOSCOPIC HARVESTING OF LEFT GREATER SAPHENOUS VEIN. LIMA TO LAD, SVG TO PD, SVG TO RAMUS;  Surgeon: [name], MD;  Location: [facility] OR;  Service: Open Heart Surgery;  Laterality: N/A;  . LEFT HEART CATH AND CORONARY ANGIOGRAPHY N/A 06/18/2019   Procedure: LEFT HEART CATH AND CORONARY ANGIOGRAPHY;  Surgeon: [name], MD;  Location: [facility] INVASIVE CV LAB;  Service: Cardiovascular;  Laterality: N/A;  . TEE WITHOUT CARDIOVERSION N/A 06/19/2019   Procedure: TRANSESOPHAGEAL ECHOCARDIOGRAM (TEE);  Surgeon: [name], MD;  Location: [facility] OR;  Service: Open Heart Surgery;  Laterality: N/A;    Current Medications: Current Meds  Medication Sig  . acetaminophen (TYLENOL) 500 MG tablet Take 2 tablets (1,000 mg total) by mouth every 6 (six) hours as needed.  [name] aluminum-magnesium hydroxide-simethicone (MAALOX) 200-200-20 MG/5ML SUSP Take 30 mLs by mouth 3 (three) times daily as needed (for gas/indigestion).   [name] apixaban (ELIQUIS) 5 MG TABS tablet Take 1 tablet (5 mg total) by mouth 2 (two) times daily. Take 2 tablets (10mg ) twice daily for 7 days, then 1 tablet (5mg ) twice daily  . aspirin EC 81 MG tablet Take 1 tablet (  81 mg total) by mouth daily.  [name] atorvastatin (LIPITOR) 80 MG tablet Take 1 tablet (80 mg total) by mouth daily at 6 PM.  . carvedilol (COREG) 12.5 MG tablet Take 12.5 mg by mouth in the morning.   . diphenoxylate-atropine (LOMOTIL) 2.5-0.025 MG tablet Take 1-2 tablets by mouth 4 (four) times daily as needed for diarrhea or loose stools.  . empagliflozin (JARDIANCE) 10 MG TABS tablet Take 1 tablet (10 mg total) by mouth daily before breakfast.  . furosemide (LASIX) 40 MG tablet Take 1 tablet (40 mg  total) by mouth daily.  [name] gabapentin (NEURONTIN) 300 MG capsule Take 1 capsule (300 mg total) by mouth 3 (three) times daily.  . insulin glargine (LANTUS SOLOSTAR) 100 UNIT/ML Solostar Pen Inject 50 Units into the skin daily.  . insulin lispro (HUMALOG KWIKPEN) 100 UNIT/ML KwikPen Inject 0.12 mLs (12 Units total) into the skin 3 (three) times daily.  . Insulin Pen Needle 29G X MISC 1 Device by Does not apply route as directed.  [name] losartan (COZAAR) 100 MG tablet Take 100 mg by mouth daily.   . Multiple Vitamins-Calcium (ONE-A-DAY WOMENS FORMULA) TABS Take 1 tablet by mouth daily with breakfast.  . simethicone (GAS-X EXTRA STRENGTH) 125 MG chewable tablet Chew 125 mg by mouth every 6 (six) hours as needed for flatulence.     Allergies:   Shellfish-derived products and Tape   Social History   Socioeconomic History  . Marital status: Married    Spouse name: Not on file  . Number of children: Not on file  . Years of education: Not on file  . Highest education level: Not on file  Occupational History  . Not on file  Tobacco Use  . Smoking status: Never Smoker  . Smokeless tobacco: Never Used  Vaping Use  . Vaping Use: Never used  Substance and Sexual Activity  . Alcohol use: Never  . Drug use: Never  . Sexual activity: Yes  Other Topics Concern  . Not on file  Social History Narrative  . Not on file   Social Determinants of Health   Financial Resource Strain:   . Difficulty of Paying Living Expenses:   Food Insecurity:   . Worried About Programme researcher, broadcasting/film/video in the Last Year:   . Barista in the Last Year:   Transportation Needs:   . Freight forwarder (Medical):   [name] Lack of Transportation (Non-Medical):   Physical Activity:   . Days of Exercise per Week:   . Minutes of Exercise per Session:   Stress:   . Feeling of Stress :   Social Connections:   . Frequency of Communication with Friends and Family:   . Frequency of Social Gatherings with Friends and  Family:   . Attends Religious Services:   . Active Member of Clubs or Organizations:   . Attends Banker Meetings:   [name] Marital Status:      Family History: The patient's family history includes Diabetes Mellitus II in her father and mother; Heart disease in her father and maternal grandmother; Hypertension in her father and mother. ROS:   Please see the history of present illness.    All 14 point review of systems negative except as described per history of present illness  EKGs/Labs/Other Studies Reviewed:      Recent Labs: 06/20/2019: Magnesium 2.2 08/07/2019: BUN 12; Creatinine, Ser 0.55; Hemoglobin 12.1; Platelets 310; Potassium 4.4; Sodium 141  Recent Lipid Panel  Component Value Date/Time   CHOL 139 08/07/2019 0932   TRIG 82 08/07/2019 0932   HDL 50 08/07/2019 0932   CHOLHDL 2.8 08/07/2019 0932   CHOLHDL 6.1 06/17/2019 0500   VLDL 23 06/17/2019 0500   LDLCALC 73 08/07/2019 0932    Physical Exam:    VS:  BP 126/72 (BP Location: Left Arm, Patient Position: Sitting, Cuff Size: Normal)   Pulse 85   Ht 5' (1.524 m)   Wt 242 lb 3.2 oz (109.9 kg)   SpO2 97%   BMI 47.30 kg/m     Wt Readings from Last 3 Encounters:  10/02/19 242 lb 3.2 oz (109.9 kg)  09/22/19 238 lb 6.4 oz (108.1 kg)  08/12/19 246 lb (111.6 kg)     GEN:  Well nourished, well developed in no acute distress HEENT: Normal NECK: No JVD; No carotid bruits LYMPHATICS: No lymphadenopathy CARDIAC: RRR, no murmurs, no rubs, no gallops RESPIRATORY:  Clear to auscultation without rales, wheezing or rhonchi  ABDOMEN: Soft, non-tender, non-distended MUSCULOSKELETAL:  No edema; No deformity  SKIN: Warm and dry LOWER EXTREMITIES: no swelling NEUROLOGIC:  Alert and oriented x 3 PSYCHIATRIC:  Normal affect   ASSESSMENT:    1. Dyslipidemia   2. Coronary artery disease involving native coronary artery of native heart without angina pectoris   3. S/P CABG x 3   4. Essential hypertension   5.  History of DVT (deep vein thrombosis)   6. S/P bariatric surgery    PLAN:    In order of problems listed above:  1. Coronary artery disease status post coronary artery bypass graft about 3 months ago.  Recovering doing well.  Wound is healed completely.  On appropriate medications.  The key right now is to modify her risk factors.  She is only 44 years old underwent end up having bypass surgery.  She is doing her part she is trying to take care of her diabetes, she is participating in rehab and she is enjoying it she is watching her diet.  She is making progress. 2. Dyslipidemia: She is on high intensity statin in spite of that her LDL is 76, therefore, I will add Zetia 10 mg to her medical regiment. 3. Essential hypertension blood pressure seems to be well controlled continue present management. 4. Diabetes mellitus: Followed by primary care.  He does some changes to the medication but the insurance company does not want to pay for Marcelline Deist, she will be on Jardiance. 5. Status post bariatric surgery.  Noted. 6. History of DVT.  Continue anticoagulation for now.   Medication Adjustments/Labs and Tests Ordered: Current medicines are reviewed at length with the patient today.  Concerns regarding medicines are outlined above.  Orders Placed This Encounter  Procedures  . Lipid panel   Medication changes:  Meds ordered this encounter  Medications  . ezetimibe (ZETIA) 10 MG tablet    Sig: Take 1 tablet (10 mg total) by mouth daily.    Dispense:  90 tablet    Refill:  1    Signed, Georgeanna Lea, MD, [facility] 10/02/2019 1:39 PM    [facility]

## 2019-10-02 NOTE — Patient Instructions (Signed)
Medication Instructions:  Your physician has recommended you make the following change in your medication:  START: Zetia 10 mg daily    *If you need a refill on your cardiac medications before your next appointment, please call your pharmacy*   Lab Work: Your physician recommends that you return for lab work today: lipid   If you have labs (blood work) drawn today and your tests are completely normal, you will receive your results only by: [name] MyChart Message (if you have MyChart) OR . A paper copy in the mail If you have any lab test that is abnormal or we need to change your treatment, we will call you to review the results.   Testing/Procedures: None.    Follow-Up: At [facility], you and your health needs are our priority.  As part of our continuing mission to provide you with exceptional heart care, we have created designated Provider Care Teams.  These Care Teams include your primary Cardiologist (physician) and Advanced Practice Providers (APPs -  Physician Assistants and Nurse Practitioners) who all work together to provide you with the care you need, when you need it.  We recommend signing up for the patient portal called "MyChart".  Sign up information is provided on this After Visit Summary.  MyChart is used to connect with patients for Virtual Visits (Telemedicine).  Patients are able to view lab/test results, encounter notes, upcoming appointments, etc.  Non-urgent messages can be sent to your provider as well.   To learn more about what you can do with MyChart, go to [email or web address].    Your next appointment:   3 month(s)  The format for your next appointment:   In Person  Provider:   [name], MD   Other Instructions  Ezetimibe Tablets What is this medicine? EZETIMIBE (ez ET i mibe) blocks the absorption of cholesterol from the stomach. It can help lower blood cholesterol for patients who are at risk of getting heart disease or a stroke. It  is only for patients whose cholesterol level is not controlled by diet. This medicine may be used for other purposes; ask your health care provider or pharmacist if you have questions. COMMON BRAND NAME(S): Zetia What should I tell my health care provider before I take this medicine? They need to know if you have any of these conditions:  liver disease  an unusual or allergic reaction to ezetimibe, medicines, foods, dyes, or preservatives  pregnant or trying to get pregnant  breast-feeding How should I use this medicine? Take this medicine by mouth with a glass of water. Follow the directions on the prescription label. This medicine can be taken with or without food. Take your doses at regular intervals. Do not take your medicine more often than directed. Talk to your pediatrician regarding the use of this medicine in children. Special care may be needed. Overdosage: If you think you have taken too much of this medicine contact a poison control center or emergency room at once. NOTE: This medicine is only for you. Do not share this medicine with others. What if I miss a dose? If you miss a dose, take it as soon as you can. If it is almost time for your next dose, take only that dose. Do not take double or extra doses. What may interact with this medicine? Do not take this medicine with any of the following medications:  fenofibrate  gemfibrozil This medicine may also interact with the following medications:  antacids  cyclosporine  herbal medicines  like red yeast rice  other medicines to lower cholesterol or triglycerides This list may not describe all possible interactions. Give your health care provider a list of all the medicines, herbs, non-prescription drugs, or dietary supplements you use. Also tell them if you smoke, drink alcohol, or use illegal drugs. Some items may interact with your medicine. What should I watch for while using this medicine? Visit your doctor or  health care professional for regular checks on your progress. You will need to have your cholesterol levels checked. If you are also taking some other cholesterol medicines, you will also need to have tests to make sure your liver is working properly. Tell your doctor or health care professional if you get any unexplained muscle pain, tenderness, or weakness, especially if you also have a fever and tiredness. You need to follow a low-cholesterol, low-fat diet while you are taking this medicine. This will decrease your risk of getting heart and blood vessel disease. Exercising and avoiding alcohol and smoking can also help. Ask your doctor or dietician for advice. What side effects may I notice from receiving this medicine? Side effects that you should report to your doctor or health care professional as soon as possible:  allergic reactions like skin rash, itching or hives, swelling of the face, lips, or tongue  dark yellow or brown urine  unusually weak or tired  yellowing of the skin or eyes Side effects that usually do not require medical attention (report to your doctor or health care professional if they continue or are bothersome):  diarrhea  dizziness  headache  stomach upset or pain This list may not describe all possible side effects. Call your doctor for medical advice about side effects. You may report side effects to FDA at 1-800-FDA-1088. Where should I keep my medicine? Keep out of the reach of children. Store at room temperature between 15 and 30 degrees C (59 and 86 degrees F). Protect from moisture. Keep container tightly closed. Throw away any unused medicine after the expiration date. NOTE: This sheet is a summary. It may not cover all possible information. If you have questions about this medicine, talk to your doctor, pharmacist, or health care provider.  2020 Elsevier/Gold Standard (2011-09-18 15:39:09)

## 2019-10-27 ENCOUNTER — Other Ambulatory Visit: Payer: Self-pay | Admitting: Physician Assistant

## 2019-10-29 ENCOUNTER — Other Ambulatory Visit: Payer: Self-pay | Admitting: Physician Assistant

## 2019-10-31 ENCOUNTER — Other Ambulatory Visit: Payer: Self-pay | Admitting: Physician Assistant

## 2019-11-03 ENCOUNTER — Other Ambulatory Visit: Payer: Self-pay | Admitting: Emergency Medicine

## 2019-11-03 MED ORDER — ATORVASTATIN CALCIUM 80 MG PO TABS: 80.0000 mg | ORAL_TABLET | Freq: Every day | ORAL | 1 refills | Status: DC

## 2019-12-25 ENCOUNTER — Encounter: Payer: Self-pay | Admitting: Internal Medicine

## 2019-12-25 ENCOUNTER — Other Ambulatory Visit: Payer: Self-pay

## 2019-12-25 ENCOUNTER — Ambulatory Visit (INDEPENDENT_AMBULATORY_CARE_PROVIDER_SITE_OTHER): Payer: 59 | Admitting: Internal Medicine

## 2019-12-25 VITALS — BP 110/70 | HR 62 | Ht 60.0 in | Wt 243.0 lb

## 2019-12-25 DIAGNOSIS — E1165 Type 2 diabetes mellitus with hyperglycemia: Secondary | ICD-10-CM

## 2019-12-25 DIAGNOSIS — E1159 Type 2 diabetes mellitus with other circulatory complications: Secondary | ICD-10-CM

## 2019-12-25 DIAGNOSIS — Z794 Long term (current) use of insulin: Secondary | ICD-10-CM | POA: Diagnosis not present

## 2019-12-25 DIAGNOSIS — E113599 Type 2 diabetes mellitus with proliferative diabetic retinopathy without macular edema, unspecified eye: Secondary | ICD-10-CM

## 2019-12-25 LAB — BASIC METABOLIC PANEL
BUN: 13 mg/dL (ref 6–23)
CO2: 31 mEq/L (ref 19–32)
Calcium: 9.2 mg/dL (ref 8.4–10.5)
Chloride: 102 mEq/L (ref 96–112)
Creatinine, Ser: 0.56 mg/dL (ref 0.40–1.20)
GFR: 117.02 mL/min (ref >60.00)
Glucose, Bld: 137 mg/dL — ABNORMAL HIGH (ref 70–99)
Potassium: 4.4 mEq/L (ref 3.5–5.1)
Sodium: 140 mEq/L (ref 135–145)

## 2019-12-25 LAB — POCT GLYCOSYLATED HEMOGLOBIN (HGB A1C): Hemoglobin A1C: 7.9 % — AB (ref 4.0–5.6)

## 2019-12-25 MED ORDER — EMPAGLIFLOZIN 25 MG PO TABS: 25.0000 mg | ORAL_TABLET | Freq: Every day | ORAL | 11 refills | Status: DC

## 2019-12-25 NOTE — Progress Notes (Signed)
Name: Beverly [name]  Age/ Sex: 44 y.o., female   MRN/ DOB: [id number], 08-08-1975     PCP: [name], MD   Reason for Endocrinology Evaluation: Type 2 Diabetes Mellitus  Initial Endocrine Consultative Visit: 09/22/2019    PATIENT IDENTIFIER: Beverly [name] is a 44 y.o. female with a past medical history of T2DM, HTN, dyslipidemia and CAD ( S/P CABG). The patient has followed with Endocrinology clinic since 09/22/2019 for consultative assistance with management of her diabetes.  DIABETIC HISTORY:  Beverly [name] was diagnosed with T2DM in 2001. Metformin - diarrhea, Januvia- and ozempic- no intolerance issues. Has been on the Medtronic  pump  from 2014 until 08/2019 . Her hemoglobin A1c has ranged from 8.3% in 2021, peaking at 10.2% in 2021.   On her initia visit to our clinic she had an A1c of 8.3% , she was on the pump with only 3 readings when downloaded, she was mainly using as a basal supply of insulin, she was ot counting carbs, we switched to MDI regimen, she was also started on Farxiga   SUBJECTIVE:   During the last visit (09/22/2019): A1c 8.3% We stopped the pump, started MDi regimen and Farxiga   Today (12/25/2019): Beverly [name]  She checks her blood sugars occasionally . The patient has not  had hypoglycemic episodes since the last clinic visit.    S/P gastric sleeve in 01/2014 ,lost ~ 70 lbs  But has gained ~ 25    HOME DIABETES REGIMEN:   Jardiance 10 mg , 1 tablet with Breakfast   Lantus 50 units ONCE daily  Humalog 12 units with each meal    Statin: yes ACE-I/ARB: yes    METER DOWNLOAD SUMMARY: did not bring     DIABETIC COMPLICATIONS: Microvascular complications:    Retinopathy (s/p laser) ,neuropathy   Denies: CKD  Last eye exam: Completed 2019  Macrovascular complications:   CAD (S/P CABG 06/19/2019)  Denies: PVD, CVA    HISTORY:  Past Medical History:  Past Medical History:  Diagnosis Date  . CAD (coronary artery disease) 06/16/2019   . Diabetes mellitus without complication (HCC)   . Hypertension   . Multiple thyroid nodules   . Nephrolithiasis 07/08/2012  . Obesity    Past Surgical History:  Past Surgical History:  Procedure Laterality Date  . APPLICATION OF WOUND VAC N/A 06/19/2019   Procedure: Application Of Wound Vac USING PREVENA INCISIONAL DRESSING;  Surgeon: [name], MD;  Location[facility] OR;  Service: Open Heart Surgery;  Laterality: N/A;  . Cardiac Stent  2009  . CORONARY ARTERY BYPASS GRAFT N/A 06/19/2019   Procedure: CORONARY ARTERY BYPASS GRAFTING (CABG) x 3 WITH ENDOSCOPIC HARVESTING OF LEFT GREATER SAPHENOUS VEIN. LIMA TO LAD, SVG TO PD, SVG TO RAMUS;  Surgeon: [name], MD;  Location: [facility] OR;  Service: Open Heart Surgery;  Laterality: N/A;  . LEFT HEART CATH AND CORONARY ANGIOGRAPHY N/A 06/18/2019   Procedure: LEFT HEART CATH AND CORONARY ANGIOGRAPHY;  Surgeon: [name], MD;  [facility];  Service: Cardiovascular;  Laterality: N/A;  . TEE WITHOUT CARDIOVERSION N/A 06/19/2019   Procedure: TRANSESOPHAGEAL ECHOCARDIOGRAM (TEE);  Surgeon: [name], MD;  Location: [facility] OR;  Service: Open Heart Surgery;  Laterality: N/A;    Social History:  reports that she has never smoked. She has never used smokeless tobacco. She reports that she does not drink alcohol and does not use drugs. Family History:  Family History  Problem Relation Age of Onset  . Hypertension Mother   . Diabetes Mellitus II Mother   . Hypertension Father   . Diabetes Mellitus II Father   . Heart disease Father        ?pacemaker vs loop recorder?  . Heart disease Maternal Grandmother        CABGx 4     HOME MEDICATIONS: Allergies as of 12/25/2019      Reactions   Shellfish-derived Products Diarrhea   Tape Other (See Comments)   Can blister the skin      Medication List       Accurate as of December 25, 2019  1:17 PM. If you have any questions, ask your nurse or doctor.         acetaminophen 500 MG tablet Commonly known as: TYLENOL Take 2 tablets (1,000 mg total) by mouth every 6 (six) hours as needed.   aluminum-magnesium hydroxide-simethicone 200-200-20 MG/5ML Susp Commonly known as: MAALOX Take 30 mLs by mouth 3 (three) times daily as needed (for gas/indigestion).   apixaban 5 MG Tabs tablet Commonly known as: Eliquis Take 1 tablet (5 mg total) by mouth 2 (two) times daily. Take 2 tablets (10mg ) twice daily for 7 days, then 1 tablet (5mg ) twice daily   aspirin EC 81 MG tablet Take 1 tablet (81 mg total) by mouth daily.   atorvastatin 80 MG tablet Commonly known as: LIPITOR Take 1 tablet (80 mg total) by mouth daily at 6 PM.   carvedilol 12.5 MG tablet Commonly known as: COREG Take 12.5 mg by mouth in the morning.   diphenoxylate-atropine 2.5-0.025 MG tablet Commonly known as: LOMOTIL Take 1-2 tablets by mouth 4 (four) times daily as needed for diarrhea or loose stools.   empagliflozin 25 MG Tabs tablet Commonly known as: Jardiance Take 1 tablet (25 mg total) by mouth daily before breakfast. What changed:   medication strength  how much to take Changed by: , MD   ezetimibe 10 MG tablet Commonly known as: ZETIA Take 1 tablet (10 mg total) by mouth daily.   furosemide 40 MG tablet Commonly known as: LASIX Take 1 tablet (40 mg total) by mouth daily.   gabapentin 300 MG capsule Commonly known as: NEURONTIN Take 1 capsule (300 mg total) by mouth 3 (three) times daily.   Gas-X Extra Strength 125 MG chewable tablet Generic drug: simethicone Chew 125 mg by mouth every 6 (six) hours as needed for flatulence.   insulin lispro 100 UNIT/ML KwikPen Commonly known as: HumaLOG KwikPen Inject 0.12 mLs (12 Units total) into the skin 3 (three) times daily.   Insulin Pen Needle 29G X 06-13-1989 Misc 1 Device by Does not apply route as directed.   B-D UF III MINI PEN NEEDLES 31G X 5 MM Misc Generic drug: Insulin Pen  Needle SMARTSIG:1 Each SUB-Q 4 Times Daily   Lantus SoloStar 100 UNIT/ML Solostar Pen Generic drug: insulin glargine Inject 50 Units into the skin daily.   losartan 100 MG tablet Commonly known as: COZAAR Take 100 mg by mouth daily.   One-A-Day Womens Formula Tabs Take 1 tablet by mouth daily with breakfast.        OBJECTIVE:   Vital Signs: BP 110/70   Pulse 62   Ht 5' (1.524 m)   Wt 243 lb (110.2 kg)   SpO2 99%   BMI 47.46 kg/m   Wt Readings from Last 3 Encounters:  12/25/19 243 lb (110.2 kg)  10/02/19 242 lb 3.2 oz (  109.9 kg)  09/22/19 238 lb 6.4 oz (108.1 kg)     Exam: General: Pt appears well and is in NAD  Lungs: Clear with good BS bilat with no rales, rhonchi, or wheezes  Heart: RRR with normal S1 and S2 and no gallops; no murmurs; no rub  Abdomen: Normoactive bowel sounds, soft, nontender, without masses or organomegaly palpable  Extremities: No pretibial edema.  Neuro: MS is good with appropriate affect, pt is alert and Ox3       DM foot exam: 09/22/2019  The skin of the feet is intact without sores or ulcerations. The pedal pulses are 2+ on right and 2+ on left. The sensation is intact to a screening 5.07, 10 gram monofilament bilaterally       DATA REVIEWED:  Lab Results  Component Value Date   HGBA1C 7.9 (A) 12/25/2019   HGBA1C 8.3 (H) 08/07/2019   HGBA1C 10.2 (H) 06/17/2019   Lab Results  Component Value Date   LDLCALC 73 08/07/2019   CREATININE 0.55 (L) 08/07/2019     Lab Results  Component Value Date   CHOL 139 08/07/2019   HDL 50 08/07/2019   LDLCALC 73 08/07/2019   TRIG 82 08/07/2019   CHOLHDL 2.8 08/07/2019         ASSESSMENT / PLAN / RECOMMENDATIONS:   1) Type 2 Diabetes Mellitus, suboptimally controlled, With retinopathic, neuropathic and macrovascular complications - Most recent A1c of 7.9 %. Goal A1c < 7.0 %.    -A1c down from 8.3% -I have praised the patient on improved glycemic control -We will increase  Jardiance as below   MEDICATIONS: - Increase Jardiance to 25 mg , 1 tablet with Breakfast  - Continue Lantus 50 units ONCE daily  - Continue Humalog 12 units with each meal   EDUCATION / INSTRUCTIONS:  BG monitoring instructions: Patient is instructed to check her blood sugars were 3 times a day, before meals.  Call [facility] if: BG persistently < 70. . I reviewed the Rule of 15 for the treatment of hypoglycemia in detail with the patient. Literature supplied.    2) Diabetic complications:   Eye: Does  have known diabetic retinopathy.   Neuro/ Feet: Does  have known diabetic peripheral neuropathy .   Renal: Patient does not have known baseline CKD. She   is  on an ACEI/ARB at present.      F/U in follow-up in 4 months   Signed electronically by: Lyndle Herrlich, MD  [facility] Endocrinology  [facility] [address] [place] [place], [place] [address] Phone: [phone] FAX: [phone]   CC: [name], MD [address] [place] [address] Phone: [phone]  Fax: [phone]  Return to Endocrinology clinic as below: Future Appointments  Date Time Provider Department Center  01/13/2020 11:00 AM [name], MD [facility]  01/15/2020  9:20 AM Georgeanna Lea, MD [facility] None  04/30/2020 10:50 AM Tobe Kervin, Konrad Dolores, MD [facility] None

## 2019-12-25 NOTE — Patient Instructions (Addendum)
-   Increase Jardiance to 25 mg , 1 tablet with Breakfast  - Continue Lantus 50 units ONCE daily  - Continue Humalog 12 units with each meal      HOW TO TREAT LOW BLOOD SUGARS (Blood sugar LESS THAN 70 MG/DL)  Please follow the RULE OF 15 for the treatment of hypoglycemia treatment (when your (blood sugars are less than 70 mg/dL)    STEP 1: Take 15 grams of carbohydrates when your blood sugar is low, which includes:   3-4 GLUCOSE TABS  OR  3-4 OZ OF JUICE OR REGULAR SODA OR  ONE TUBE OF GLUCOSE GEL     STEP 2: RECHECK blood sugar in 15 MINUTES STEP 3: If your blood sugar is still low at the 15 minute recheck --> then, go back to STEP 1 and treat AGAIN with another 15 grams of carbohydrates.

## 2020-01-02 ENCOUNTER — Ambulatory Visit: Payer: 59 | Admitting: Cardiology

## 2020-01-13 ENCOUNTER — Ambulatory Visit: Payer: 59 | Admitting: Thoracic Surgery (Cardiothoracic Vascular Surgery)

## 2020-01-13 ENCOUNTER — Other Ambulatory Visit: Payer: Self-pay

## 2020-01-13 ENCOUNTER — Encounter: Payer: Self-pay | Admitting: Thoracic Surgery (Cardiothoracic Vascular Surgery)

## 2020-01-13 VITALS — BP 115/74 | HR 83 | Resp 20 | Ht 60.0 in | Wt 250.0 lb

## 2020-01-13 DIAGNOSIS — Z86718 Personal history of other venous thrombosis and embolism: Secondary | ICD-10-CM

## 2020-01-13 NOTE — Progress Notes (Signed)
[address]       Beverly [name] [address]             [phone]      HPI: Beverly [name] returns for a scheduled follow-up visit  Beverly [name] is a 44 year old [name] with a history of obesity, insulin-dependent diabetes, hypertension, hyperlipidemia, and coronary disease.  She underwent coronary bypass grafting on 06/19/2019.  In the postoperative.  She presented back with left foot swelling.  She was treated for cellulitis but also was diagnosed with a below-knee DVT.  She was treated with Eliquis.  She also had issues with brachial plexopathy.  Her pain in the left hand has resolved.  She still has some numbness along the ulnar surface of the hand.  She is not having any anginal pain.  She does get some swelling in her legs but nothing out of the ordinary.  She is not having any calf or foot pain.  She does complain of pain in her right shoulder and some limitation in range of motion.  Past Medical History:  Diagnosis Date  . CAD (coronary artery disease) 06/16/2019  . Diabetes mellitus without complication (HCC)   . Hypertension   . Multiple thyroid nodules   . Nephrolithiasis 07/08/2012  . Obesity     Current Outpatient Medications  Medication Sig Dispense Refill  . acetaminophen (TYLENOL) 500 MG tablet Take 2 tablets (1,000 mg total) by mouth every 6 (six) hours as needed. 30 tablet 0  . aluminum-magnesium hydroxide-simethicone (MAALOX) 200-200-20 MG/5ML SUSP Take 30 mLs by mouth 3 (three) times daily as needed (for gas/indigestion).     [name] aspirin EC 81 MG tablet Take 1 tablet (81 mg total) by mouth daily. 30 tablet 6  . atorvastatin (LIPITOR) 80 MG tablet Take 1 tablet (80 mg total) by mouth daily at 6 PM. 90 tablet 1  . B-D UF III MINI PEN NEEDLES 31G X 5 MM MISC SMARTSIG:1 Each SUB-Q 4 Times Daily    . carvedilol (COREG) 12.5 MG tablet Take 12.5 mg by mouth in the morning.     . empagliflozin (JARDIANCE) 25 MG TABS tablet Take 1 tablet (25 mg total) by mouth  daily before breakfast. 30 tablet 11  . furosemide (LASIX) 40 MG tablet Take 1 tablet (40 mg total) by mouth daily. 14 tablet 0  . gabapentin (NEURONTIN) 300 MG capsule Take 1 capsule (300 mg total) by mouth 3 (three) times daily. 90 capsule 2  . insulin glargine (LANTUS SOLOSTAR) 100 UNIT/ML Solostar Pen Inject 50 Units into the skin daily. 15 mL 11  . insulin lispro (HUMALOG KWIKPEN) 100 UNIT/ML KwikPen Inject 0.12 mLs (12 Units total) into the skin 3 (three) times daily. 15 mL 11  . Insulin Pen Needle 29G X MISC 1 Device by Does not apply route as directed. 150 each 11  . losartan (COZAAR) 100 MG tablet Take 100 mg by mouth daily.     . Multiple Vitamins-Calcium (ONE-A-DAY WOMENS FORMULA) TABS Take 1 tablet by mouth daily with breakfast.    . simethicone (GAS-X EXTRA STRENGTH) 125 MG chewable tablet Chew 125 mg by mouth every 6 (six) hours as needed for flatulence.    . diphenoxylate-atropine (LOMOTIL) 2.5-0.025 MG tablet Take 1-2 tablets by mouth 4 (four) times daily as needed for diarrhea or loose stools. 90 tablet 0  . ezetimibe (ZETIA) 10 MG tablet Take 1 tablet (10 mg total) by mouth daily. 90 tablet 1   No current  facility-administered medications for this visit.    Physical Exam BP 115/74 (BP Location: Right Arm, Patient Position: Sitting)   Pulse 83   Resp 20   Ht 5' (1.524 m)   Wt 250 lb (113.4 kg)   SpO2 98% Comment: RA with mask on  BMI 48.69 kg/m  Beverly [name] in no acute distress Alert and oriented x3 with no focal deficits Lungs clear bilaterally Cardiac regular rate and rhythm No peripheral edema  Impression: Beverly [name] is a 44 year old [name] with multiple cardiac risk factors who underwent coronary bypass grafting about 6 months ago.  Postoperative course was complicated by brachial plexopathy and peroneal DVT.  Peroneal DVT-she has been treated with anticoagulation for 6 months.  She had no prior history of DVT and it occurred in the early  postoperative period.  There is no indication for continued anticoagulation at this point.  We will go ahead and stop the apixaban.  Brachial plexopathy/right shoulder pain-brachial plexopathy has resolved for the most part.  She does still have some pain and limited range of motion of the right shoulder.  I am going to refer her to Dr. Ave Filter at [facility] orthopedics to evaluate the shoulder.  Plan: Stop Eliquis Referral to orthopedics  [name], MD [facility] [phone]

## 2020-01-14 ENCOUNTER — Other Ambulatory Visit: Payer: Self-pay

## 2020-01-14 DIAGNOSIS — E119 Type 2 diabetes mellitus without complications: Secondary | ICD-10-CM | POA: Insufficient documentation

## 2020-01-14 DIAGNOSIS — E042 Nontoxic multinodular goiter: Secondary | ICD-10-CM | POA: Insufficient documentation

## 2020-01-15 ENCOUNTER — Encounter: Payer: Self-pay | Admitting: Cardiology

## 2020-01-15 ENCOUNTER — Ambulatory Visit: Payer: 59 | Admitting: Cardiology

## 2020-01-15 ENCOUNTER — Other Ambulatory Visit: Payer: Self-pay

## 2020-01-15 VITALS — BP 130/70 | HR 77 | Ht 60.0 in | Wt 249.0 lb

## 2020-01-15 DIAGNOSIS — Z9884 Bariatric surgery status: Secondary | ICD-10-CM

## 2020-01-15 DIAGNOSIS — I10 Essential (primary) hypertension: Secondary | ICD-10-CM | POA: Diagnosis not present

## 2020-01-15 DIAGNOSIS — E785 Hyperlipidemia, unspecified: Secondary | ICD-10-CM

## 2020-01-15 DIAGNOSIS — E782 Mixed hyperlipidemia: Secondary | ICD-10-CM

## 2020-01-15 DIAGNOSIS — E1142 Type 2 diabetes mellitus with diabetic polyneuropathy: Secondary | ICD-10-CM

## 2020-01-15 DIAGNOSIS — Z794 Long term (current) use of insulin: Secondary | ICD-10-CM

## 2020-01-15 DIAGNOSIS — Z86718 Personal history of other venous thrombosis and embolism: Secondary | ICD-10-CM

## 2020-01-15 DIAGNOSIS — Z951 Presence of aortocoronary bypass graft: Secondary | ICD-10-CM

## 2020-01-15 NOTE — Progress Notes (Signed)
Cardiology Office Note:    Date:  01/15/2020   ID:  Beverly [name], DOB Jul 04, 1975, MRN [id number]  PCP:  [name], MD  Cardiologist:  [name], MD    Referring MD: [name], MD   Chief Complaint  Patient presents with  . Follow-up  I am doing fine  History of Present Illness:    Beverly [name] is a 44 y.o. female past medical history significant for poorly controlled diabetes, essential hypertension, dyslipidemia, morbid obesity, status post gastric bypass surgery years ago, status post recent coronary artery bypass graft. She also had history of stenting done in 2009. After that no follow-up. She came to hospital because of intermittent chest pain cardiac catheterization has been done and then she ended up having coronary artery bypass graft with LIMA to LAD SVG to ramus intermediate, SVG to PDA that was done on June 19, 2019. Recovery was complicated by DVT. She is being on anticoagulation for that She comes to our office today for follow-up overall she seems to be doing well Denies having any chest pain tightness squeezing pressure burning chest, she finished her rehab and now she try to exercise by her own.  She walks on the regular basis she said about half an hour every day.  She works alone.  He does get short of breath while walking.  Past Medical History:  Diagnosis Date  . Anemia 06/17/2019  . CAD (coronary artery disease) 06/16/2019  . CAD (coronary artery disease), native coronary artery 07/08/2012  . Chest pain 06/16/2019  . Diabetes mellitus (HCC) 09/23/2019  . Diabetes mellitus without complication (HCC)   . Dyslipidemia 09/23/2019  . Encounter for long-term (current) use of insulin (HCC) 07/08/2012  . Hematuria 07/08/2012  . History of DVT (deep vein thrombosis)   . Hyperlipidemia 07/08/2012  . Hypertension   . Hypokalemia 06/17/2019  . Insulin-requiring or dependent type II diabetes mellitus (HCC)   . Multiple thyroid nodules   .  Nephrolithiasis 07/08/2012  . Obesity   . Positive D dimer   . S/P bariatric surgery 02/19/2014  . 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 Type 2 diabetes mellitus with diabetic polyneuropathy, with long-term current use of insulin (HCC) 09/23/2019  . Type 2 diabetes mellitus with hyperglycemia, with long-term current use of insulin (HCC) 09/23/2019  . 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 Type II diabetes mellitus (HCC) 07/08/2012  . Unstable angina [facility])     Past Surgical History:  Procedure Laterality Date  . APPLICATION OF WOUND VAC N/A 06/19/2019   Procedure: Application Of Wound Vac USING PREVENA INCISIONAL DRESSING;  Surgeon: [name], MD;  Location: [facility] OR;  Service: Open Heart Surgery;  Laterality: N/A;  . Cardiac Stent  2009  . CORONARY ARTERY BYPASS GRAFT N/A 06/19/2019   Procedure: CORONARY ARTERY BYPASS GRAFTING (CABG) x 3 WITH ENDOSCOPIC HARVESTING OF LEFT GREATER SAPHENOUS VEIN. LIMA TO LAD, SVG TO PD, SVG TO RAMUS;  Surgeon: [name], MD;  Location: [facility] OR;  Service: Open Heart Surgery;  Laterality: N/A;  . LEFT HEART CATH AND CORONARY ANGIOGRAPHY N/A 06/18/2019   Procedure: LEFT HEART CATH AND CORONARY ANGIOGRAPHY;  Surgeon: [name], MD;  Location: [facility] INVASIVE CV LAB;  Service: Cardiovascular;  Laterality: N/A;  . TEE WITHOUT CARDIOVERSION N/A 06/19/2019   Procedure: TRANSESOPHAGEAL ECHOCARDIOGRAM (TEE);  Surgeon: [name], MD;  Location: [facility] OR;  Service: Open Heart  Surgery;  Laterality: N/A;    Current Medications: Current Meds  Medication Sig  . acetaminophen (TYLENOL) 500 MG tablet Take 2 tablets (1,000 mg total) by mouth every 6 (six) hours as needed.  [name] aluminum-magnesium hydroxide-simethicone (MAALOX) 200-200-20 MG/5ML SUSP Take 30 mLs by mouth 3 (three) times daily as needed (for gas/indigestion).   [name] aspirin EC 81 MG tablet Take 1 tablet (81 mg total) by mouth  daily.  [name] atorvastatin (LIPITOR) 80 MG tablet Take 1 tablet (80 mg total) by mouth daily at 6 PM.  . B-D UF III MINI PEN NEEDLES 31G X 5 MM MISC SMARTSIG:1 Each SUB-Q 4 Times Daily  . carvedilol (COREG) 12.5 MG tablet Take 12.5 mg by mouth in the morning.   . empagliflozin (JARDIANCE) 25 MG TABS tablet Take 1 tablet (25 mg total) by mouth daily before breakfast.  . ezetimibe (ZETIA) 10 MG tablet Take 1 tablet (10 mg total) by mouth daily.  . furosemide (LASIX) 40 MG tablet Take 1 tablet (40 mg total) by mouth daily.  [name] gabapentin (NEURONTIN) 300 MG capsule Take 1 capsule (300 mg total) by mouth 3 (three) times daily.  . insulin glargine (LANTUS SOLOSTAR) 100 UNIT/ML Solostar Pen Inject 50 Units into the skin daily.  . insulin lispro (HUMALOG KWIKPEN) 100 UNIT/ML KwikPen Inject 0.12 mLs (12 Units total) into the skin 3 (three) times daily.  . Insulin Pen Needle 29G X MISC 1 Device by Does not apply route as directed.  [name] losartan (COZAAR) 100 MG tablet Take 100 mg by mouth daily.   . Multiple Vitamins-Calcium (ONE-A-DAY WOMENS FORMULA) TABS Take 1 tablet by mouth daily with breakfast.  . simethicone (GAS-X EXTRA STRENGTH) 125 MG chewable tablet Chew 125 mg by mouth every 6 (six) hours as needed for flatulence.     Allergies:   Shellfish-derived products and Tape   Social History   Socioeconomic History  . Marital status: Married    Spouse name: Not on file  . Number of children: Not on file  . Years of education: Not on file  . Highest education level: Not on file  Occupational History  . Not on file  Tobacco Use  . Smoking status: Never Smoker  . Smokeless tobacco: Never Used  Vaping Use  . Vaping Use: Never used  Substance and Sexual Activity  . Alcohol use: Never  . Drug use: Never  . Sexual activity: Yes  Other Topics Concern  . Not on file  Social History Narrative  . Not on file   Social Determinants of Health   Financial Resource Strain:   . Difficulty of Paying  Living Expenses: Not on file  Food Insecurity:   . Worried About Programme researcher, broadcasting/film/video in the Last Year: Not on file  . Ran Out of Food in the Last Year: Not on file  Transportation Needs:   . Lack of Transportation (Medical): Not on file  . Lack of Transportation (Non-Medical): Not on file  Physical Activity:   . Days of Exercise per Week: Not on file  . Minutes of Exercise per Session: Not on file  Stress:   . Feeling of Stress : Not on file  Social Connections:   . Frequency of Communication with Friends and Family: Not on file  . Frequency of Social Gatherings with Friends and Family: Not on file  . Attends Religious Services: Not on file  . Active Member of Clubs or Organizations: Not on file  . Attends Club or  Organization Meetings: Not on file  . Marital Status: Not on file     Family History: The patient's family history includes Diabetes Mellitus II in her father and mother; Heart disease in her father and maternal grandmother; Hypertension in her father and mother. ROS:   Please see the history of present illness.    All 14 point review of systems negative except as described per history of present illness  EKGs/Labs/Other Studies Reviewed:      Recent Labs: 06/20/2019: Magnesium 2.2 08/07/2019: Hemoglobin 12.1; Platelets 310 12/25/2019: BUN 13; Creatinine, Ser 0.56; Potassium 4.4; Sodium 140  Recent Lipid Panel    Component Value Date/Time   CHOL 139 08/07/2019 0932   TRIG 82 08/07/2019 0932   HDL 50 08/07/2019 0932   CHOLHDL 2.8 08/07/2019 0932   CHOLHDL 6.1 06/17/2019 0500   VLDL 23 06/17/2019 0500   LDLCALC 73 08/07/2019 0932    Physical Exam:    VS:  BP 130/70 (BP Location: Left Arm, Patient Position: Sitting, Cuff Size: Large)   Pulse 77   Ht 5' (1.524 m)   Wt 249 lb (112.9 kg)   SpO2 97%   BMI 48.63 kg/m     Wt Readings from Last 3 Encounters:  01/15/20 249 lb (112.9 kg)  01/13/20 250 lb (113.4 kg)  12/25/19 243 lb (110.2 kg)     GEN:  Well  nourished, well developed in no acute distress HEENT: Normal NECK: No JVD; No carotid bruits LYMPHATICS: No lymphadenopathy CARDIAC: RRR, no murmurs, no rubs, no gallops RESPIRATORY:  Clear to auscultation without rales, wheezing or rhonchi  ABDOMEN: Soft, non-tender, non-distended MUSCULOSKELETAL:  No edema; No deformity  SKIN: Warm and dry LOWER EXTREMITIES: no swelling NEUROLOGIC:  Alert and oriented x 3 PSYCHIATRIC:  Normal affect   ASSESSMENT:    1. S/P CABG x 3   2. S/P bariatric surgery   3. Type 2 diabetes mellitus with diabetic polyneuropathy, with long-term current use of insulin (HCC)   4. Primary hypertension   5. Mixed hyperlipidemia   6. Dyslipidemia   7. History of DVT (deep vein thrombosis)    PLAN:    In order of problems listed above:  1. Status post coronary artery bypass graft done in June 19, 2019.  Doing well at recover completely.  The key right now is risk factors modifications. 2. Status post bariatric surgery.  Noted.  Sadly she does have still a problem with obesity.  She understands she try to work on that. 3. Diabetes mellitus.  I did review her K PN which showed me her hemoglobin A1c of 7.9 this is from June 24, 2019.  I stressed importance of good control of diabetes stressed importance of exercises on the regular basis and good diet.  She need to follow-up with her primary care physician to get her diabetes better under control. 4. Mixed dyslipidemia: I do have her fasting lipid profile from Aug 07, 2018 1K PN show me LDL 73 with HDL of 50.  She is already on Lipitor 80 as well as Zetia 10 that was added recently.  We will recheck her fasting lipid profile within the next few months. 5. History of DVT.  She is anticoagulated which I will continue for at least 6 months.   Medication Adjustments/Labs and Tests Ordered: Current medicines are reviewed at length with the patient today.  Concerns regarding medicines are outlined above.  No orders of the  defined types were placed in this encounter.  Medication changes:  No orders of the defined types were placed in this encounter.   Signed, Georgeanna Lea, MD, [facility] 01/15/2020 9:35 AM    [facility]

## 2020-01-15 NOTE — Addendum Note (Signed)
Addended by: Hazle Quant on: 01/15/2020 09:46 AM   Modules accepted: Orders

## 2020-01-15 NOTE — Patient Instructions (Signed)
Medication Instructions:  Your physician recommends that you continue on your current medications as directed. Please refer to the Current Medication list given to you today.  *If you need a refill on your cardiac medications before your next appointment, please call your pharmacy*   Lab Work: Your physician recommends that you return for lab work when fasting: lipid   If you have labs (blood work) drawn today and your tests are completely normal, you will receive your results only by: [name] MyChart Message (if you have MyChart) OR . A paper copy in the mail If you have any lab test that is abnormal or we need to change your treatment, we will call you to review the results.   Testing/Procedures: None    Follow-Up: At [facility], you and your health needs are our priority.  As part of our continuing mission to provide you with exceptional heart care, we have created designated Provider Care Teams.  These Care Teams include your primary Cardiologist (physician) and Advanced Practice Providers (APPs -  Physician Assistants and Nurse Practitioners) who all work together to provide you with the care you need, when you need it.  We recommend signing up for the patient portal called "MyChart".  Sign up information is provided on this After Visit Summary.  MyChart is used to connect with patients for Virtual Visits (Telemedicine).  Patients are able to view lab/test results, encounter notes, upcoming appointments, etc.  Non-urgent messages can be sent to your provider as well.   To learn more about what you can do with MyChart, go to [email or web address].    Your next appointment:   5 month(s)  The format for your next appointment:   In Person  Provider:   [name], MD   Other Instructions

## 2020-01-19 ENCOUNTER — Telehealth: Payer: Self-pay | Admitting: Cardiology

## 2020-01-19 NOTE — Telephone Encounter (Signed)
I do not see any note on NTG. Pt thought she was supposed to have a RX.

## 2020-01-19 NOTE — Telephone Encounter (Signed)
    Pt c/o medication issue:  1. Name of Medication: nitroglycerin  2. How are you currently taking this medication (dosage and times per day)?   3. Are you having a reaction (difficulty breathing--STAT)?   4. What is your medication issue? Pt said Dr. Kirtland Bouchard prescribed nitroglycerin but her pharmacy haven't receive prescription. She said please sent to [facility] - [place], [place] - [address]

## 2020-01-21 ENCOUNTER — Other Ambulatory Visit: Payer: Self-pay

## 2020-01-21 MED ORDER — NITROGLYCERIN 0.4 MG SL SUBL: 0.4000 mg | SUBLINGUAL_TABLET | SUBLINGUAL | 11 refills | Status: DC | PRN

## 2020-01-21 NOTE — Telephone Encounter (Signed)
   Pt calling back to follow up her NTG

## 2020-01-21 NOTE — Telephone Encounter (Signed)
Called pt and notified her that the RX for nitroglycerin had been sent in to the pharmacy.

## 2020-01-21 NOTE — Telephone Encounter (Signed)
Any patient with known coronary artery disease must always have nitroglycerin sublingual handy with him.  This is a patient with history of CABG and stenting!!  I really do not understand why this should even be a question.  Please send nitro for the patient.  Thank you

## 2020-01-21 NOTE — Addendum Note (Signed)
Addended by: Hazle Quant on: 01/21/2020 11:12 AM   Modules accepted: Orders

## 2020-01-21 NOTE — Telephone Encounter (Signed)
Nitroglycerin sent in.

## 2020-03-16 DIAGNOSIS — R079 Chest pain, unspecified: Secondary | ICD-10-CM | POA: Diagnosis not present

## 2020-03-17 DIAGNOSIS — I2510 Atherosclerotic heart disease of native coronary artery without angina pectoris: Secondary | ICD-10-CM | POA: Diagnosis not present

## 2020-03-17 DIAGNOSIS — E785 Hyperlipidemia, unspecified: Secondary | ICD-10-CM

## 2020-03-17 DIAGNOSIS — I517 Cardiomegaly: Secondary | ICD-10-CM | POA: Diagnosis not present

## 2020-03-17 DIAGNOSIS — I119 Hypertensive heart disease without heart failure: Secondary | ICD-10-CM | POA: Diagnosis not present

## 2020-03-17 DIAGNOSIS — Z794 Long term (current) use of insulin: Secondary | ICD-10-CM

## 2020-03-17 DIAGNOSIS — I255 Ischemic cardiomyopathy: Secondary | ICD-10-CM

## 2020-03-17 DIAGNOSIS — E1169 Type 2 diabetes mellitus with other specified complication: Secondary | ICD-10-CM

## 2020-03-17 DIAGNOSIS — R079 Chest pain, unspecified: Secondary | ICD-10-CM | POA: Diagnosis not present

## 2020-03-31 ENCOUNTER — Ambulatory Visit: Payer: 59 | Admitting: Cardiology

## 2020-04-22 ENCOUNTER — Ambulatory Visit: Payer: 59 | Admitting: Cardiology

## 2020-04-24 ENCOUNTER — Other Ambulatory Visit: Payer: Self-pay | Admitting: Cardiology

## 2020-04-30 ENCOUNTER — Ambulatory Visit: Payer: 59 | Admitting: Internal Medicine

## 2020-06-11 ENCOUNTER — Ambulatory Visit: Payer: 59 | Admitting: Internal Medicine

## 2020-06-14 ENCOUNTER — Other Ambulatory Visit: Payer: Self-pay

## 2020-06-14 DIAGNOSIS — E1165 Type 2 diabetes mellitus with hyperglycemia: Secondary | ICD-10-CM

## 2020-06-14 DIAGNOSIS — K219 Gastro-esophageal reflux disease without esophagitis: Secondary | ICD-10-CM

## 2020-06-14 DIAGNOSIS — L0390 Cellulitis, unspecified: Secondary | ICD-10-CM

## 2020-06-14 DIAGNOSIS — G9009 Other idiopathic peripheral autonomic neuropathy: Secondary | ICD-10-CM

## 2020-06-14 DIAGNOSIS — F419 Anxiety disorder, unspecified: Secondary | ICD-10-CM

## 2020-06-14 DIAGNOSIS — E559 Vitamin D deficiency, unspecified: Secondary | ICD-10-CM

## 2020-06-14 DIAGNOSIS — I252 Old myocardial infarction: Secondary | ICD-10-CM

## 2020-06-14 DIAGNOSIS — E039 Hypothyroidism, unspecified: Secondary | ICD-10-CM | POA: Insufficient documentation

## 2020-06-14 DIAGNOSIS — M150 Primary generalized (osteo)arthritis: Secondary | ICD-10-CM | POA: Insufficient documentation

## 2020-06-14 HISTORY — DX: Vitamin D deficiency, unspecified: E55.9

## 2020-06-14 HISTORY — DX: Cellulitis, unspecified: L03.90

## 2020-06-14 HISTORY — DX: Type 2 diabetes mellitus with hyperglycemia: E11.65

## 2020-06-14 HISTORY — DX: Old myocardial infarction: I25.2

## 2020-06-14 HISTORY — DX: Primary generalized (osteo)arthritis: M15.0

## 2020-06-14 HISTORY — DX: Hypothyroidism, unspecified: E03.9

## 2020-06-14 HISTORY — DX: Anxiety disorder, unspecified: F41.9

## 2020-06-14 HISTORY — DX: Gastro-esophageal reflux disease without esophagitis: K21.9

## 2020-06-14 HISTORY — DX: Other idiopathic peripheral autonomic neuropathy: G90.09

## 2020-06-15 ENCOUNTER — Ambulatory Visit (INDEPENDENT_AMBULATORY_CARE_PROVIDER_SITE_OTHER): Payer: 59 | Admitting: Cardiology

## 2020-06-15 ENCOUNTER — Other Ambulatory Visit: Payer: Self-pay

## 2020-06-15 ENCOUNTER — Encounter: Payer: Self-pay | Admitting: Cardiology

## 2020-06-15 VITALS — BP 122/74 | HR 73 | Ht 60.0 in | Wt 249.0 lb

## 2020-06-15 DIAGNOSIS — Z951 Presence of aortocoronary bypass graft: Secondary | ICD-10-CM

## 2020-06-15 DIAGNOSIS — Z9884 Bariatric surgery status: Secondary | ICD-10-CM | POA: Diagnosis not present

## 2020-06-15 DIAGNOSIS — Z794 Long term (current) use of insulin: Secondary | ICD-10-CM

## 2020-06-15 DIAGNOSIS — E113599 Type 2 diabetes mellitus with proliferative diabetic retinopathy without macular edema, unspecified eye: Secondary | ICD-10-CM | POA: Diagnosis not present

## 2020-06-15 DIAGNOSIS — I10 Essential (primary) hypertension: Secondary | ICD-10-CM

## 2020-06-15 NOTE — Patient Instructions (Signed)

## 2020-06-15 NOTE — Progress Notes (Signed)
Cardiology Office Note:    Date:  06/15/2020   ID:  Beverly [name], DOB March 07, 1976, MRN [id number]  PCP:  [name], MD  Cardiologist:  [name], MD    Referring MD: [name], MD   Chief Complaint  Patient presents with  . Follow-up  Doing fine no  History of Present Illness:    Beverly [name] is a 45 y.o. female  past medical history significant for poorly controlled diabetes, essential hypertension, dyslipidemia, morbid obesity, status post gastric bypass surgery years ago, status post recent coronary artery bypass graft. She also had history of stenting done in 2009. After that no follow-up. She came to hospital because of intermittent chest pain cardiac catheterization has been done and then she ended up having coronary artery bypass graft with LIMA to LAD SVG to ramus intermediate, SVG to PDA that was done on June 19, 2019. Recovery was complicated by DVT.  Recently she ended going to round of hospital because of atypical chest pain.  That was in December.  Quite extensive evaluation has been done which included stress testing being negative, she also had echocardiogram showed preserved left ventricle ejection fraction, biochemical markers were negative.  She is being discharged home with some nonsteroidal anti-inflammatory medications. Comes today to my office for follow-up.  Overall seems to be doing well described to have some atypical chest pain sharp stabbing lasting only for split-second not related to exercise.  Described to have fatigue and tiredness.  She admits not to exercise at all.  Past Medical History:  Diagnosis Date  . Anemia 06/17/2019  . Anxiety disorder 06/14/2020  . CAD (coronary artery disease) 06/16/2019  . CAD (coronary artery disease), native coronary artery 07/08/2012  . Cellulitis 06/14/2020  . Chest pain 06/16/2019  . Diabetes mellitus (HCC) 09/23/2019  . Diabetes mellitus without complication (HCC)   . Dyslipidemia 09/23/2019  .  Encounter for long-term (current) use of insulin (HCC) 07/08/2012  . Gastro-esophageal reflux disease without esophagitis 06/14/2020  . Hematuria 07/08/2012  . History of DVT (deep vein thrombosis)   . Hyperglycemia due to type 2 diabetes mellitus (HCC) 06/14/2020  . Hyperlipidemia 07/08/2012  . Hypertension   . Hypokalemia 06/17/2019  . Hypothyroidism 06/14/2020  . Idiopathic peripheral autonomic neuropathy 06/14/2020  . Insulin-requiring or dependent type II diabetes mellitus (HCC)   . Multiple thyroid nodules   . Nephrolithiasis 07/08/2012  . Obesity   . Old myocardial infarction 06/14/2020  . Positive D dimer   . Primary generalized (osteo)arthritis 06/14/2020  . S/P bariatric surgery 02/19/2014  . 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 Type 2 diabetes mellitus with diabetic polyneuropathy, with long-term current use of insulin (HCC) 09/23/2019  . Type 2 diabetes mellitus with hyperglycemia, with long-term current use of insulin (HCC) 09/23/2019  . 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 Type II diabetes mellitus (HCC) 07/08/2012  . Unstable angina (HCC)   . Vitamin D deficiency 06/14/2020    Past Surgical History:  Procedure Laterality Date  . APPLICATION OF WOUND VAC N/A 06/19/2019   Procedure: Application Of Wound Vac USING PREVENA INCISIONAL DRESSING;  Surgeon: [name], MD;  Location[facility] OR;  Service: Open Heart Surgery;  Laterality: N/A;  . Cardiac Stent  2009  . CORONARY ARTERY BYPASS GRAFT N/A 06/19/2019   Procedure: CORONARY ARTERY BYPASS GRAFTING (CABG) x 3 WITH ENDOSCOPIC HARVESTING OF LEFT GREATER SAPHENOUS VEIN. LIMA TO LAD, SVG  TO PD, SVG TO RAMUS;  Surgeon: Loreli SlotHendrickson, Steven C, MD;  [facility];  Service: Open Heart Surgery;  Laterality: N/A;  . LEFT HEART CATH AND CORONARY ANGIOGRAPHY N/A 06/18/2019   Procedure: LEFT HEART CATH AND CORONARY ANGIOGRAPHY;  Surgeon: Marykay LexHarding, David W, MD;  [facility];  Service: Cardiovascular;  Laterality: N/A;  . TEE WITHOUT CARDIOVERSION N/A 06/19/2019   Procedure: TRANSESOPHAGEAL ECHOCARDIOGRAM (TEE);  Surgeon: Loreli SlotHendrickson, Steven C, MD;  [facility];  Service: Open Heart Surgery;  Laterality: N/A;    Current Medications: Current Meds  Medication Sig  . acetaminophen (TYLENOL) 500 MG tablet Take 2 tablets (1,000 mg total) by mouth every 6 (six) hours as needed. (Patient taking differently: Take 1,000 mg by mouth every 6 (six) hours as needed for moderate pain.)  . aluminum-magnesium hydroxide-simethicone (MAALOX) 200-200-20 MG/5ML SUSP Take 30 mLs by mouth 3 (three) times daily as needed (for gas/indigestion).   [name]. aspirin EC 81 MG tablet Take 1 tablet (81 mg total) by mouth daily.  [name]. atorvastatin (LIPITOR) 80 MG tablet 1 TABLET BY MOUTH DAILY AT 6 PM  . B-D UF III MINI PEN NEEDLES 31G X 5 MM MISC SMARTSIG:1 Each SUB-Q 4 Times Daily  . carvedilol (COREG) 12.5 MG tablet Take 12.5 mg by mouth in the morning.   . empagliflozin (JARDIANCE) 25 MG TABS tablet Take 1 tablet (25 mg total) by mouth daily before breakfast.  . ezetimibe (ZETIA) 10 MG tablet Take 1 tablet (10 mg total) by mouth daily.  . fluconazole (DIFLUCAN) 200 MG tablet Take 1 tablet by mouth daily.  . furosemide (LASIX) 40 MG tablet Take 1 tablet (40 mg total) by mouth daily. (Patient taking differently: Take 40 mg by mouth daily as needed for fluid.)  . gabapentin (NEURONTIN) 300 MG capsule Take 1 capsule (300 mg total) by mouth 3 (three) times daily.  . insulin glargine (LANTUS SOLOSTAR) 100 UNIT/ML Solostar Pen Inject 50 Units into the skin daily.  . insulin lispro (HUMALOG KWIKPEN) 100 UNIT/ML KwikPen Inject 0.12 mLs (12 Units total) into the skin 3 (three) times daily.  . Insulin Pen Needle 29G X 5MM MISC 1 Device by Does not apply route as directed.  . Multiple Vitamins-Calcium (ONE-A-DAY WOMENS FORMULA) TABS Take 1 tablet by mouth daily with breakfast.  . nitroGLYCERIN  (NITROSTAT) 0.4 MG SL tablet Place 1 tablet (0.4 mg total) under the tongue every 5 (five) minutes as needed.  [name]. olmesartan (BENICAR) 40 MG tablet Take 40 mg by mouth daily.  . simethicone (MYLICON) 125 MG chewable tablet Chew 125 mg by mouth every 6 (six) hours as needed for flatulence.     Allergies:   Shellfish-derived products and Tape   Social History   Socioeconomic History  . Marital status: Married    Spouse name: Not on file  . Number of children: Not on file  . Years of education: Not on file  . Highest education level: Not on file  Occupational History  . Not on file  Tobacco Use  . Smoking status: Never Smoker  . Smokeless tobacco: Never Used  Vaping Use  . Vaping Use: Never used  Substance and Sexual Activity  . Alcohol use: Never  . Drug use: Never  . Sexual activity: Yes  Other Topics Concern  . Not on file  Social History Narrative  . Not on file   Social Determinants of Health   Financial Resource Strain: Not on file  Food Insecurity: Not on  file  Transportation Needs: Not on file  Physical Activity: Not on file  Stress: Not on file  Social Connections: Not on file     Family History: The patient's family history includes Diabetes Mellitus II in her father and mother; Heart disease in her father and maternal grandmother; Hypertension in her father and mother. ROS:   Please see the history of present illness.    All 14 point review of systems negative except as described per history of present illness  EKGs/Labs/Other Studies Reviewed:      Recent Labs: 06/20/2019: Magnesium 2.2 08/07/2019: Hemoglobin 12.1; Platelets 310 12/25/2019: BUN 13; Creatinine, Ser 0.56; Potassium 4.4; Sodium 140  Recent Lipid Panel    Component Value Date/Time   CHOL 139 08/07/2019 0932   TRIG 82 08/07/2019 0932   HDL 50 08/07/2019 0932   CHOLHDL 2.8 08/07/2019 0932   CHOLHDL 6.1 06/17/2019 0500   VLDL 23 06/17/2019 0500   LDLCALC 73 08/07/2019 0932    Physical  Exam:    VS:  BP 122/74 (BP Location: Left Arm, Patient Position: Sitting)   Pulse 73   Ht 5' (1.524 m)   Wt 249 lb (112.9 kg)   SpO2 94%   BMI 48.63 kg/m     Wt Readings from Last 3 Encounters:  06/15/20 249 lb (112.9 kg)  01/15/20 249 lb (112.9 kg)  01/13/20 250 lb (113.4 kg)     GEN:  Well nourished, well developed in no acute distress HEENT: Normal NECK: No JVD; No carotid bruits LYMPHATICS: No lymphadenopathy CARDIAC: RRR, no murmurs, no rubs, no gallops RESPIRATORY:  Clear to auscultation without rales, wheezing or rhonchi  ABDOMEN: Soft, non-tender, non-distended MUSCULOSKELETAL:  No edema; No deformity  SKIN: Warm and dry LOWER EXTREMITIES: no swelling NEUROLOGIC:  Alert and oriented x 3 PSYCHIATRIC:  Normal affect   ASSESSMENT:    1. S/P CABG x 3   2. S/P bariatric surgery   3. 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 Primary hypertension    PLAN:    In order of problems listed above:  1. Status post coronary bypass grafting this young lady.  Recent admission to hospital reviewed.  No obstructive coronary artery disease identified.  Stress test negative, echocardiogram preserved ejection fraction.  The key is risk factors modifications, she is on antiplatelet therapy which I will continue. 2. Status post bariatric surgery.  Noted that her weight is up.  She understands she need to work better on her weight control.  I have strongly recommended to be active and exercise.  She does have 54-1/2 years old daughter and I want her to be more active with her child. 3. Dyslipidemia will call primary care physician to get fasting lipid profile, she is on Zetia as well as Lipitor which I will continue. 4. Essential hypertension, blood pressure looks good today we will continue present management. 5. Overall  suicide situation.  She is a diabetic with poorly controlled diabetes she is being followed by endocrinologist end up having bypass surgery at early age.  We will try to do the best we can to keep her away from problems.  But she need to take care of herself as well.  I strongly recommend exercise we did talk about diet and ask her to follow-up with her endocrinologist.    Medication Adjustments/Labs and Tests Ordered: Current medicines are reviewed at length with  the patient today.  Concerns regarding medicines are outlined above.  No orders of the defined types were placed in this encounter.  Medication changes: No orders of the defined types were placed in this encounter.   Signed, Georgeanna Lea, MD, [facility] 06/15/2020 9:50 AM    [facility]

## 2020-12-16 ENCOUNTER — Ambulatory Visit (INDEPENDENT_AMBULATORY_CARE_PROVIDER_SITE_OTHER): Payer: 59 | Admitting: Cardiology

## 2020-12-16 ENCOUNTER — Encounter: Payer: Self-pay | Admitting: Cardiology

## 2020-12-16 ENCOUNTER — Other Ambulatory Visit: Payer: Self-pay

## 2020-12-16 VITALS — BP 120/60 | HR 66 | Ht 60.0 in | Wt 251.0 lb

## 2020-12-16 DIAGNOSIS — E1165 Type 2 diabetes mellitus with hyperglycemia: Secondary | ICD-10-CM | POA: Diagnosis not present

## 2020-12-16 DIAGNOSIS — Z86718 Personal history of other venous thrombosis and embolism: Secondary | ICD-10-CM

## 2020-12-16 DIAGNOSIS — E785 Hyperlipidemia, unspecified: Secondary | ICD-10-CM | POA: Diagnosis not present

## 2020-12-16 DIAGNOSIS — Z951 Presence of aortocoronary bypass graft: Secondary | ICD-10-CM | POA: Diagnosis not present

## 2020-12-16 DIAGNOSIS — Z794 Long term (current) use of insulin: Secondary | ICD-10-CM

## 2020-12-16 NOTE — Addendum Note (Signed)
Addended by: Delorse Limber I on: 12/16/2020 09:55 AM   Modules accepted: Orders

## 2020-12-16 NOTE — Patient Instructions (Signed)
Medication Instructions:  Your physician recommends that you continue on your current medications as directed. Please refer to the Current Medication list given to you today.  *If you need a refill on your cardiac medications before your next appointment, please call your pharmacy*   Lab Work: nONE If you have labs (blood work) drawn today and your tests are completely normal, you will receive your results only by: MyChart Message (if you have MyChart) OR A paper copy in the mail If you have any lab test that is abnormal or we need to change your treatment, we will call you to review the results.   Testing/Procedures: Your physician has requested that you have an echocardiogram. Echocardiography is a painless test that uses sound waves to create images of your heart. It provides your doctor with information about the size and shape of your heart and how well your heart's chambers and valves are working. This procedure takes approximately one hour. There are no restrictions for this procedure.    Follow-Up: At [facility], you and your health needs are our priority.  As part of our continuing mission to provide you with exceptional heart care, we have created designated Provider Care Teams.  These Care Teams include your primary Cardiologist (physician) and Advanced Practice Providers (APPs -  Physician Assistants and Nurse Practitioners) who all work together to provide you with the care you need, when you need it.  We recommend signing up for the patient portal called "MyChart".  Sign up information is provided on this After Visit Summary.  MyChart is used to connect with patients for Virtual Visits (Telemedicine).  Patients are able to view lab/test results, encounter notes, upcoming appointments, etc.  Non-urgent messages can be sent to your provider as well.   To learn more about what you can do with MyChart, go to [email or web address].    Your next appointment:   5  month(s)  The format for your next appointment:   In Person  Provider:   [name], MD   Other Instructions

## 2020-12-16 NOTE — Progress Notes (Signed)
Cardiology Office Note:    Date:  12/16/2020   ID:  Beverly [name], DOB 10-05-1975, MRN [id number]  PCP:  [name], MD  Cardiologist:  [name], MD    Referring MD: [name], MD   Chief Complaint  Patient presents with   Follow-up  I got chills and fever for last few months  History of Present Illness:    Beverly [name] is a 45 y.o. female     past medical history significant for poorly controlled diabetes, essential hypertension, dyslipidemia, morbid obesity, status post gastric bypass surgery years ago, status post recent coronary artery bypass graft.  She also had history of stenting done in 2009.  After that no follow-up.  She came to hospital because of intermittent chest pain cardiac catheterization has been done and then she ended up having coronary artery bypass graft with LIMA to LAD SVG to ramus intermediate, SVG to PDA that was done on June 19, 2019.  Recovery was complicated by DVT.  Recently she ended going to round of hospital because of atypical chest pain.  That was in December.  Quite extensive evaluation has been done which included stress testing being negative, she also had echocardiogram showed preserved left ventricle ejection fraction, biochemical markers were negative.  She is being discharged home with some nonsteroidal anti-inflammatory medications. She comes today to my office for follow-up.  Likely cardiac wise she seems to be doing well but she described the fact that she got a tick bite and since that time she is being having some fever chills apparently was no rash after tick just symptoms of weakness fatigue and unexplained sweating and chills.  She is does have aggressive work-up being done by her primary care physician.  She did have Lyme titers sent.  Past Medical History:  Diagnosis Date   Anemia 06/17/2019   Anxiety disorder 06/14/2020   CAD (coronary artery disease) 06/16/2019   CAD (coronary artery disease), native coronary artery  07/08/2012   Cellulitis 06/14/2020   Chest pain 06/16/2019   Diabetes mellitus (HCC) 09/23/2019   Diabetes mellitus without complication (HCC)    Dyslipidemia 09/23/2019   Encounter for long-term (current) use of insulin (HCC) 07/08/2012   Gastro-esophageal reflux disease without esophagitis 06/14/2020   Hematuria 07/08/2012   History of DVT (deep vein thrombosis)    Hyperglycemia due to type 2 diabetes mellitus (HCC) 06/14/2020   Hyperlipidemia 07/08/2012   Hypertension    Hypokalemia 06/17/2019   Hypothyroidism 06/14/2020   Idiopathic peripheral autonomic neuropathy 06/14/2020   Insulin-requiring or dependent type II diabetes mellitus (HCC)    Multiple thyroid nodules    Nephrolithiasis 07/08/2012   Obesity    Old myocardial infarction 06/14/2020   Positive D dimer    Primary generalized (osteo)arthritis 06/14/2020   S/P bariatric surgery 02/19/2014   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   Type 2 diabetes mellitus with diabetic polyneuropathy, with long-term current use of insulin (HCC) 09/23/2019   Type 2 diabetes mellitus with hyperglycemia, with long-term current use of insulin (HCC) 09/23/2019   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, with long-term current use of insulin (HCC) 09/23/2019   Type II diabetes mellitus (HCC) 07/08/2012   Unstable angina (HCC)    Vitamin D deficiency 06/14/2020    Past Surgical History:  Procedure Laterality Date   APPLICATION OF WOUND VAC N/A 06/19/2019   Procedure: Application Of Wound Vac USING PREVENA INCISIONAL DRESSING;  Surgeon: [name], MD;  Location: [facility] OR;  Service: Open Heart Surgery;  Laterality: N/A;   Cardiac Stent  2009   CORONARY ARTERY BYPASS GRAFT N/A 06/19/2019   Procedure: CORONARY ARTERY BYPASS GRAFTING (CABG) x 3 WITH ENDOSCOPIC HARVESTING OF LEFT GREATER SAPHENOUS VEIN. LIMA TO LAD, SVG TO PD, SVG TO RAMUS;  Surgeon: [name], MD;  Location: [facility] OR;  Service: Open Heart Surgery;  Laterality: N/A;   LEFT HEART  CATH AND CORONARY ANGIOGRAPHY N/A 06/18/2019   Procedure: LEFT HEART CATH AND CORONARY ANGIOGRAPHY;  Surgeon: [name], MD;  Location: [facility] INVASIVE CV LAB;  Service: Cardiovascular;  Laterality: N/A;   TEE WITHOUT CARDIOVERSION N/A 06/19/2019   Procedure: TRANSESOPHAGEAL ECHOCARDIOGRAM (TEE);  Surgeon: [name], MD;  Location: [facility] OR;  Service: Open Heart Surgery;  Laterality: N/A;    Current Medications: Current Meds  Medication Sig   acetaminophen (TYLENOL) 500 MG tablet Take 1,000 mg by mouth as needed for moderate pain.   aluminum-magnesium hydroxide-simethicone (MAALOX) 200-200-20 MG/5ML SUSP Take 30 mLs by mouth 3 (three) times daily as needed (for gas/indigestion).    aspirin EC 81 MG tablet Take 1 tablet (81 mg total) by mouth daily.   atorvastatin (LIPITOR) 80 MG tablet 1 TABLET BY MOUTH DAILY AT 6 PM   B-D UF III MINI PEN NEEDLES 31G X 5 MM MISC SMARTSIG:1 Each SUB-Q 4 Times Daily   carvedilol (COREG) 12.5 MG tablet Take 12.5 mg by mouth in the morning.    empagliflozin (JARDIANCE) 25 MG TABS tablet Take 1 tablet (25 mg total) by mouth daily before breakfast.   fluconazole (DIFLUCAN) 200 MG tablet Take 1 tablet by mouth daily.   furosemide (LASIX) 40 MG tablet Take 1 tablet (40 mg total) by mouth daily. (Patient taking differently: Take 40 mg by mouth daily as needed for fluid.)   gabapentin (NEURONTIN) 300 MG capsule Take 1 capsule (300 mg total) by mouth 3 (three) times daily.   insulin glargine (LANTUS SOLOSTAR) 100 UNIT/ML Solostar Pen Inject 50 Units into the skin daily.   insulin lispro (HUMALOG KWIKPEN) 100 UNIT/ML KwikPen Inject 0.12 mLs (12 Units total) into the skin 3 (three) times daily.   Insulin Pen Needle 29G X MISC 1 Device by Does not apply route as directed.   Multiple Vitamins-Calcium (ONE-A-DAY WOMENS FORMULA) TABS Take 1 tablet by mouth daily with breakfast.   olmesartan (BENICAR) 40 MG tablet Take 40 mg by mouth daily.   simethicone  (MYLICON) 125 MG chewable tablet Chew 125 mg by mouth every 6 (six) hours as needed for flatulence.     Allergies:   Shellfish-derived products and Tape   Social History   Socioeconomic History   Marital status: Married    Spouse name: Not on file   Number of children: Not on file   Years of education: Not on file   Highest education level: Not on file  Occupational History   Not on file  Tobacco Use   Smoking status: Never   Smokeless tobacco: Never  Vaping Use   Vaping Use: Never used  Substance and Sexual Activity   Alcohol use: Never   Drug use: Never   Sexual activity: Yes  Other Topics Concern   Not on file  Social History Narrative   Not on file   Social Determinants of Health   Financial Resource Strain: Not on file  Food Insecurity: Not on file  Transportation Needs: Not on file  Physical Activity: Not on file  Stress: Not on file  Social Connections: Not on file     Family History: The patient's family history includes Diabetes Mellitus II in her father and mother; Heart disease in her father and maternal grandmother; Hypertension in her father and mother. ROS:   Please see the history of present illness.    All 14 point review of systems negative except as described per history of present illness  EKGs/Labs/Other Studies Reviewed:      Recent Labs: 12/25/2019: BUN 13; Creatinine, Ser 0.56; Potassium 4.4; Sodium 140  Recent Lipid Panel    Component Value Date/Time   CHOL 139 08/07/2019 0932   TRIG 82 08/07/2019 0932   HDL 50 08/07/2019 0932   CHOLHDL 2.8 08/07/2019 0932   CHOLHDL 6.1 06/17/2019 0500   VLDL 23 06/17/2019 0500   LDLCALC 73 08/07/2019 0932    Physical Exam:    VS:  BP 120/60 (BP Location: Left Arm, Patient Position: Sitting, Cuff Size: Large)   Pulse 66   Ht 5' (1.524 m)   Wt 251 lb (113.9 kg)   SpO2 99%   BMI 49.02 kg/m     Wt Readings from Last 3 Encounters:  12/16/20 251 lb (113.9 kg)  06/15/20 249 lb (112.9 kg)   01/15/20 249 lb (112.9 kg)     GEN:  Well nourished, well developed in no acute distress HEENT: Normal NECK: No JVD; No carotid bruits LYMPHATICS: No lymphadenopathy CARDIAC: RRR, no murmurs, no rubs, no gallops RESPIRATORY:  Clear to auscultation without rales, wheezing or rhonchi  ABDOMEN: Soft, non-tender, non-distended MUSCULOSKELETAL:  No edema; No deformity  SKIN: Warm and dry LOWER EXTREMITIES: no swelling NEUROLOGIC:  Alert and oriented x 3 PSYCHIATRIC:  Normal affect   ASSESSMENT:    1. S/P CABG x 3   2. History of DVT (deep vein thrombosis)   3. Dyslipidemia   4. Type 2 diabetes mellitus with hyperglycemia, with long-term current use of insulin (HCC)    PLAN:    In order of problems listed above:  Status post coronary bypass graft.  Denies have any chest pain tightness squeezing pressure burning chest.  He does have some tenderness in the chest reproducible by pressing chest wall.  She is on appropriate medication which include antiplatelets therapy as well as beta-blocker and statin which I will continue. Diabetes mellitus she tells me that her diabetes is poorly controlled.  Last hemoglobin A1c I have is from September of last year which was 7.9.  I strongly recommended to follow-up with endocrinology.  I stressed 1 more time importance of taking care of her diabetes. Dyslipidemia she is on high intense statin she is on Lipitor 80 as well as Zetia 10 I will call primary care physician to get fasting lipid profile.  Her target LDL should be less than 70 favorably less than 50. History of DVT stable that was only after surgery.   Medication Adjustments/Labs and Tests Ordered: Current medicines are reviewed at length with the patient today.  Concerns regarding medicines are outlined above.  No orders of the defined types were placed in this encounter.  Medication changes: No orders of the defined types were placed in this encounter.   Signed, Georgeanna Lea,  MD, [facility] 12/16/2020 9:50 AM    [facility]

## 2020-12-29 ENCOUNTER — Other Ambulatory Visit: Payer: 59

## 2021-01-06 ENCOUNTER — Ambulatory Visit (INDEPENDENT_AMBULATORY_CARE_PROVIDER_SITE_OTHER): Payer: 59

## 2021-01-06 ENCOUNTER — Other Ambulatory Visit: Payer: Self-pay

## 2021-01-06 DIAGNOSIS — Z86718 Personal history of other venous thrombosis and embolism: Secondary | ICD-10-CM | POA: Diagnosis not present

## 2021-01-06 DIAGNOSIS — E1165 Type 2 diabetes mellitus with hyperglycemia: Secondary | ICD-10-CM | POA: Diagnosis not present

## 2021-01-06 DIAGNOSIS — Z951 Presence of aortocoronary bypass graft: Secondary | ICD-10-CM | POA: Diagnosis not present

## 2021-01-06 DIAGNOSIS — E785 Hyperlipidemia, unspecified: Secondary | ICD-10-CM | POA: Diagnosis not present

## 2021-01-06 DIAGNOSIS — Z794 Long term (current) use of insulin: Secondary | ICD-10-CM

## 2021-01-06 LAB — ECHOCARDIOGRAM COMPLETE
AR max vel: 1.21 cm2
AV Area VTI: 1.31 cm2
AV Area mean vel: 1.2 cm2
AV Mean grad: 10 mmHg
AV Peak grad: 16.6 mmHg
Ao pk vel: 2.04 m/s
Area-P 1/2: 4.21 cm2
Calc EF: 56.8 %
S' Lateral: 2.8 cm
Single Plane A2C EF: 59.1 %
Single Plane A4C EF: 54.7 %

## 2021-01-11 ENCOUNTER — Telehealth: Payer: Self-pay

## 2021-01-11 NOTE — Telephone Encounter (Signed)
-----   Message from Georgeanna Lea, MD sent at 01/11/2021  2:23 PM EDT ----- Echocardiogram showed preserved left ventricle ejection fraction, overall looks good

## 2021-01-11 NOTE — Telephone Encounter (Signed)
Called patient. Patient made aware of the results. Verbalized understanding. No questions or concerns expressed at this time.   

## 2021-01-19 IMAGING — DX DG CHEST 1V PORT
1 series · 1 of 1 positions shown · non-contrast
Comparison: Chest x-ray 06/19/2019.

CLINICAL DATA: Cardiac surgery.

EXAM:
PORTABLE CHEST 1 VIEW

[chest ap]
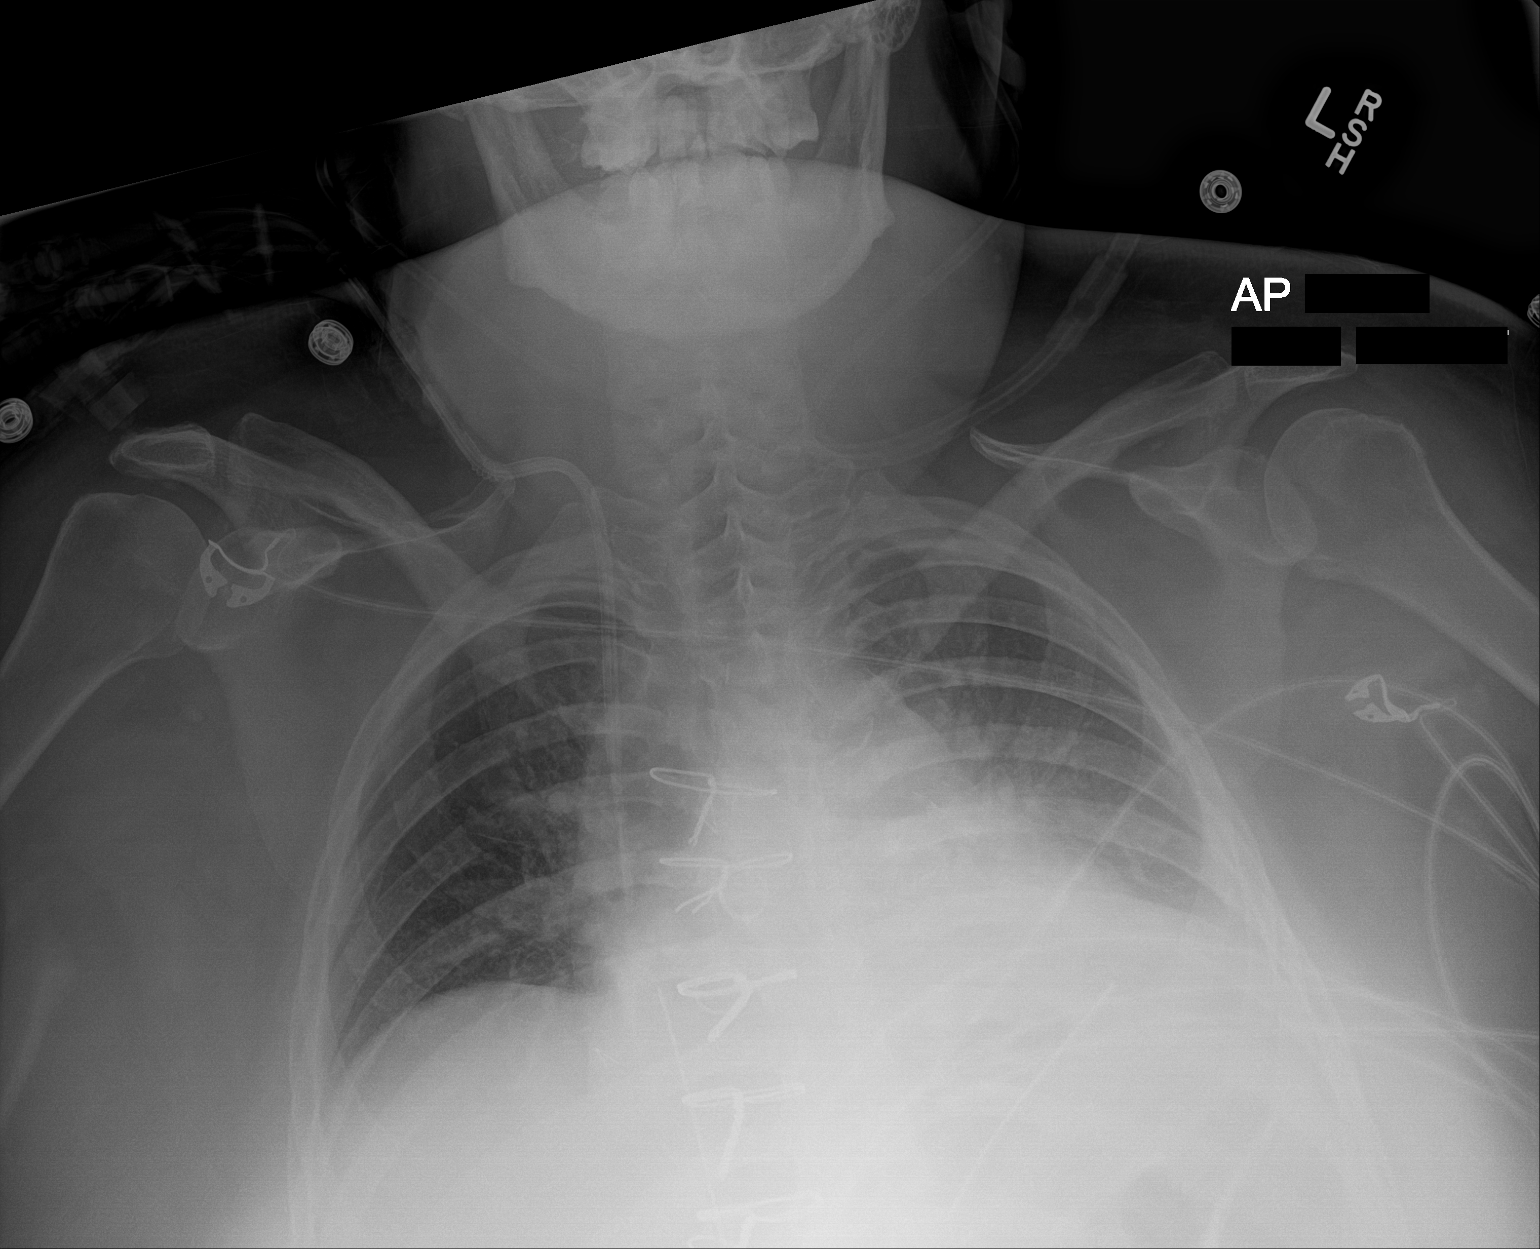

[1 of 1 positions shown; findings below may reference images not displayed]

FINDINGS: Interim extubation and removal of NG tube. Right IJ line stable
position. Mediastinal drainage catheters noted over the lower chest.
No pneumothorax. Prior CABG. Cardiomegaly with pulmonary venous
congestion and mild bilateral interstitial prominence. CHF cannot be
excluded. Low lung volumes. Left base infiltrate and small left
pleural effusion cannot be excluded.
IMPRESSION: 1. Interim extubation removal of NG tube. Right IJ line stable
position. Mediastinal drainage catheters noted over the lower chest.
No pneumothorax.

2. Prior CABG. Cardiomegaly with mild pulmonary venous congestion
and bilateral interstitial prominence. Mild CHF cannot be excluded.

3. Low lung volumes. Left base infiltrate small left pleural
effusion cannot be excluded.

## 2021-01-20 IMAGING — DX DG CHEST 1V PORT
1 series · 1 of 1 positions shown · non-contrast
Comparison: 06/20/2019

CLINICAL DATA: Status post CABG

EXAM:
PORTABLE CHEST 1 VIEW

[chest ap]
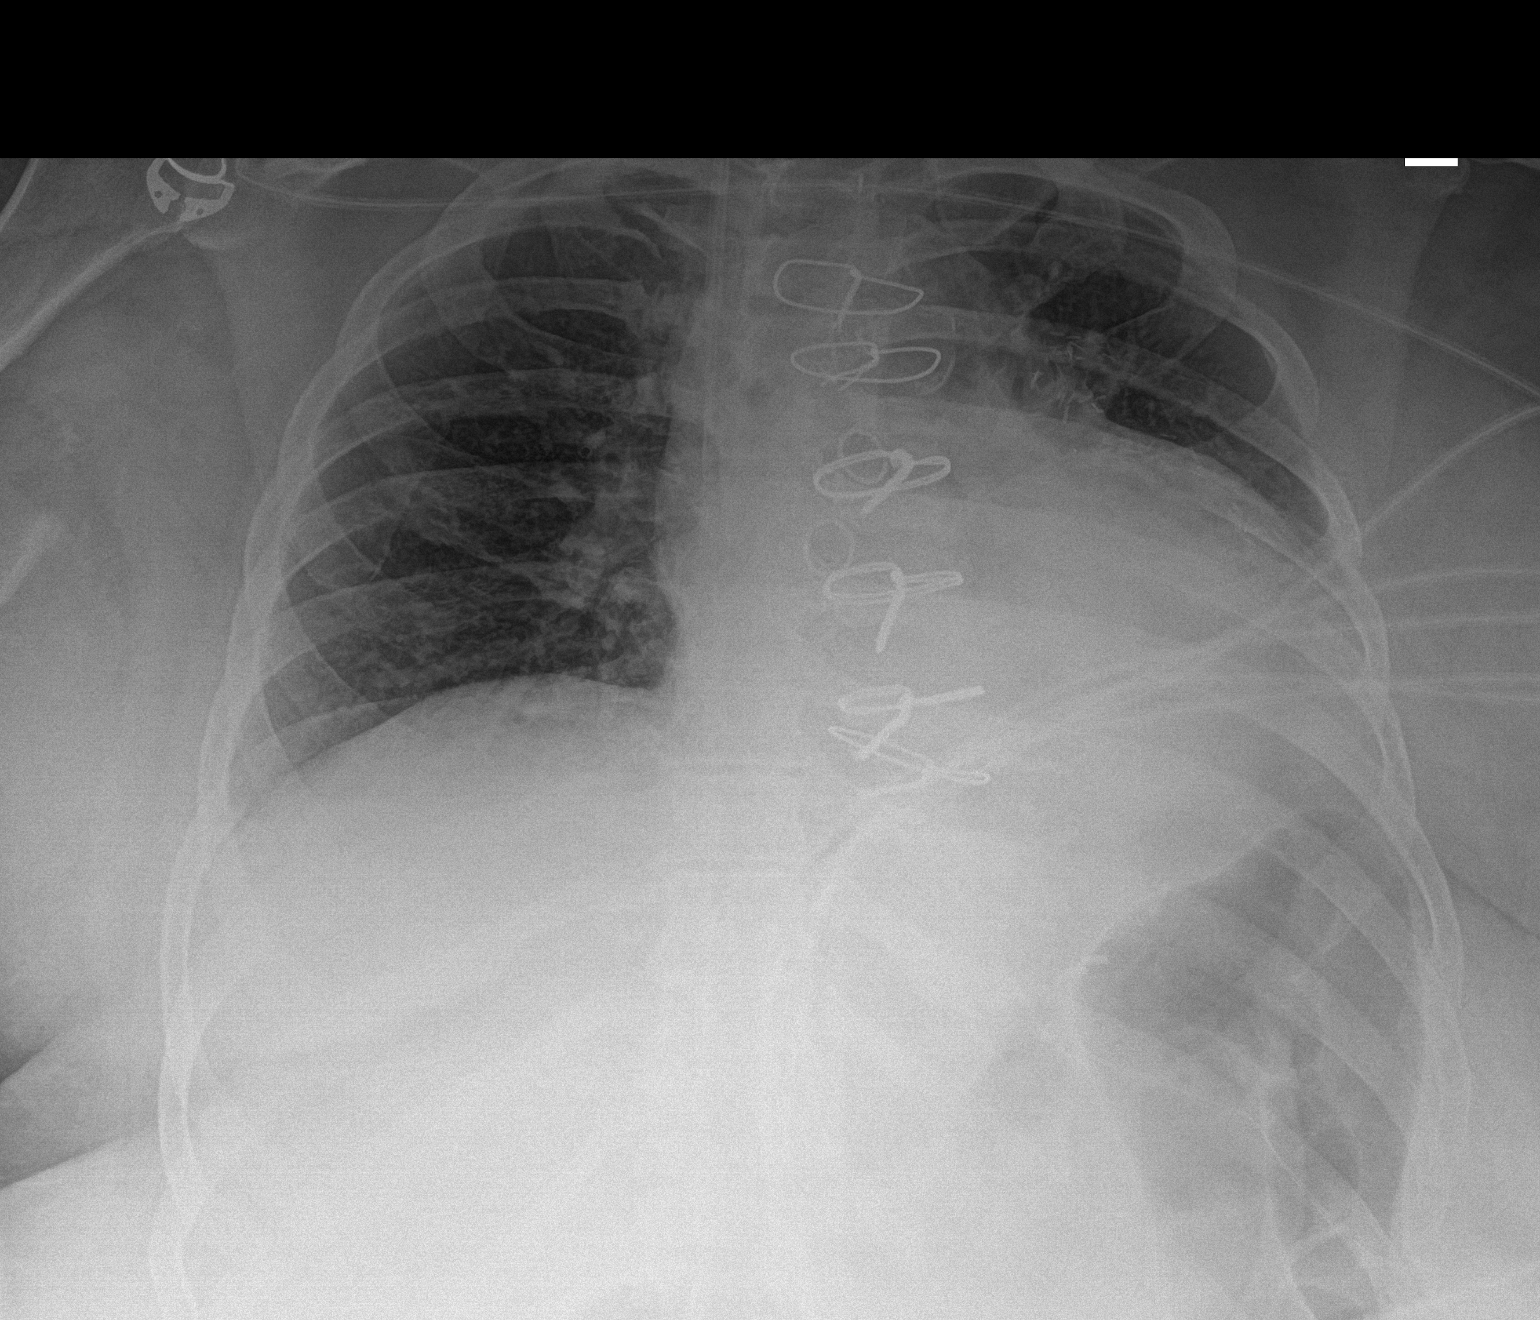

[1 of 1 positions shown; findings below may reference images not displayed]

FINDINGS: Right jugular sheath with the tip projecting over the cavoatrial
junction.

Left basilar atelectasis. No pleural effusion. No pneumothorax.
Stable cardiomegaly. Recent CABG. No acute osseous abnormality.
IMPRESSION: Status post CABG.

Left basilar atelectasis.

Right jugular sheath with the tip projecting over the cavoatrial
junction.

## 2021-01-21 IMAGING — CR DG CHEST 2V
1 series · 1 of 1 positions shown · non-contrast
Comparison: Chest radiograph 06/21/2019

CLINICAL DATA: Atelectasis.

EXAM:
CHEST - 2 VIEW

[chest pa]
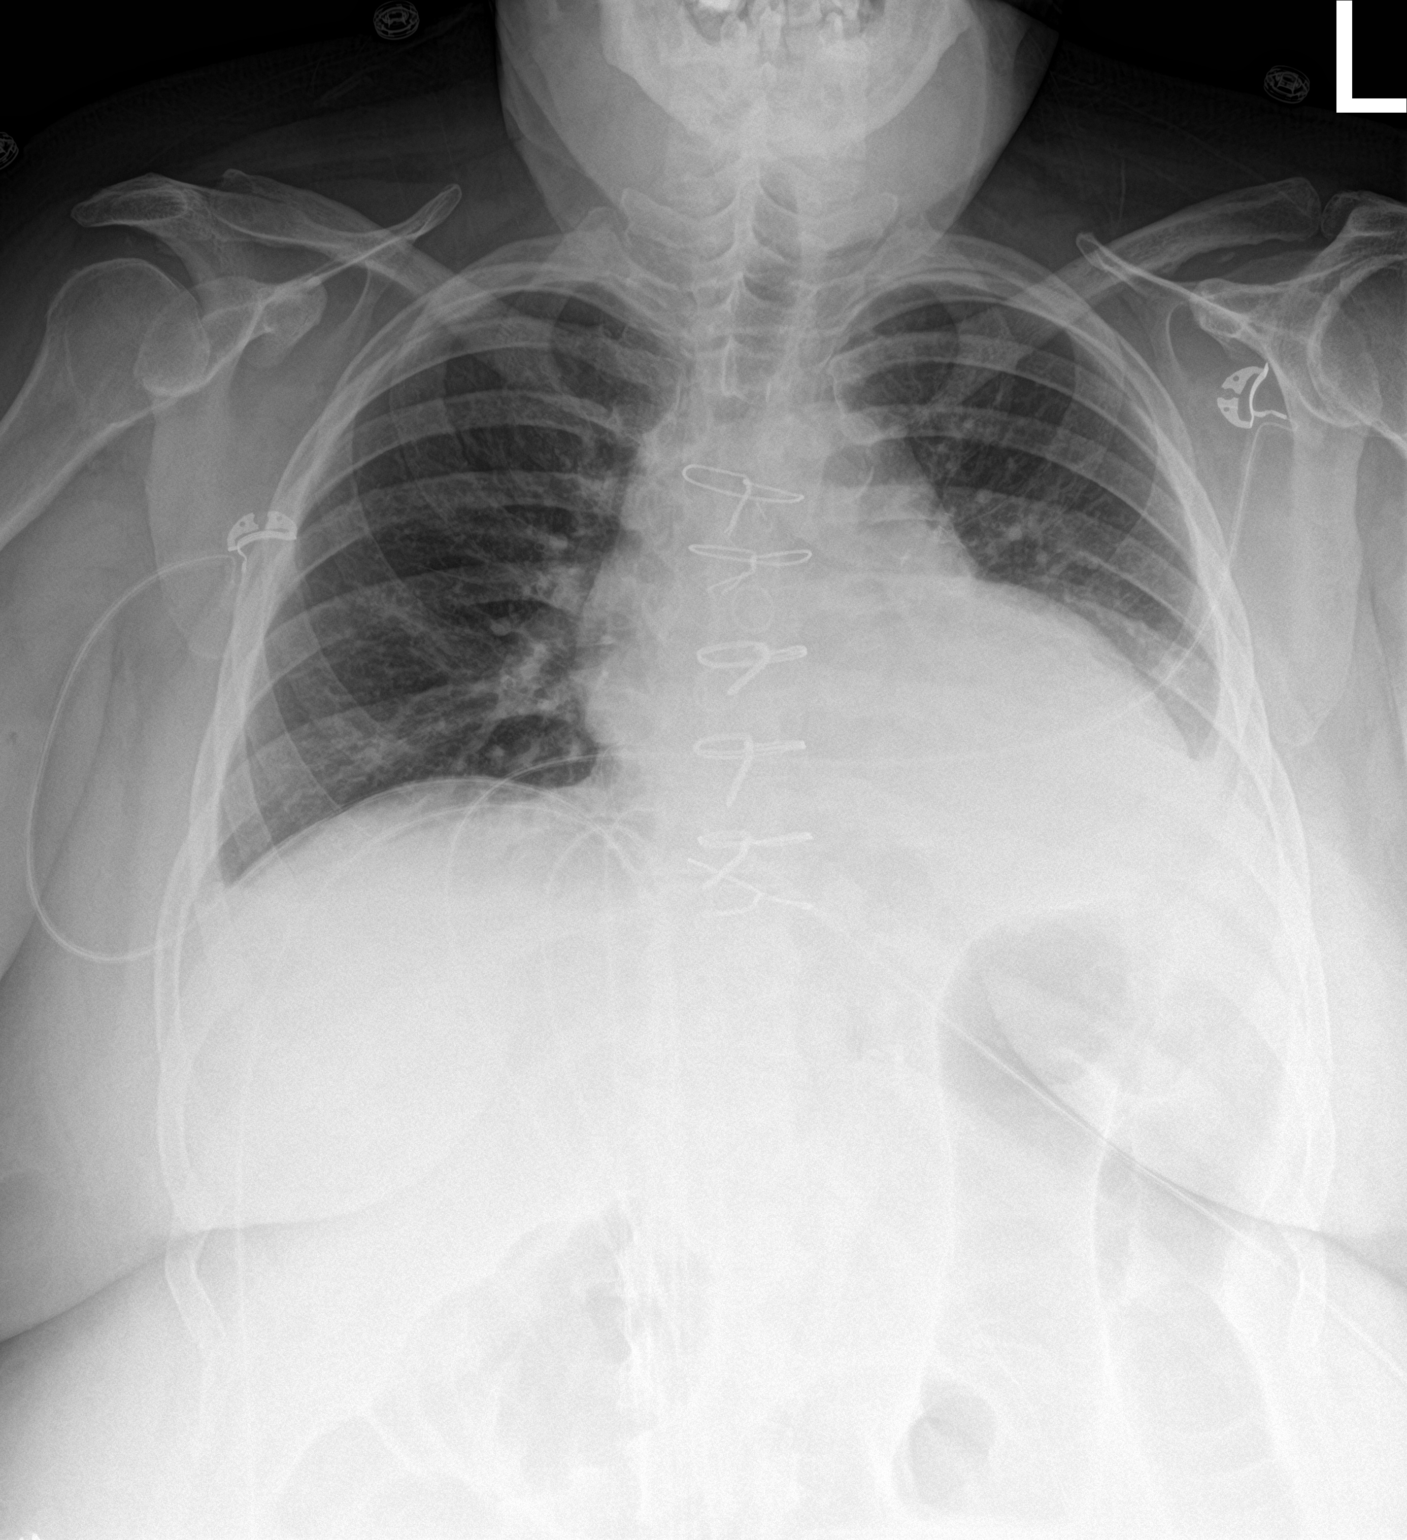

[1 of 1 positions shown; findings below may reference images not displayed]

FINDINGS: Monitoring leads overlie the patient. Stable cardiac and mediastinal
contours status post median sternotomy. Interval removal right IJ
central venous catheter. Small left pleural effusion with underlying
consolidation. No pneumothorax. Gaseous distended loops of small
bowel.
IMPRESSION: Small left effusion with underlying consolidation.

Gaseous distended loops of small bowel may represent ileus.

## 2021-02-12 ENCOUNTER — Other Ambulatory Visit: Payer: Self-pay | Admitting: Cardiology

## 2021-02-21 ENCOUNTER — Other Ambulatory Visit: Payer: Self-pay | Admitting: Internal Medicine

## 2021-02-21 ENCOUNTER — Other Ambulatory Visit: Payer: Self-pay | Admitting: Cardiology

## 2021-02-21 NOTE — Telephone Encounter (Signed)
There is a refill request for Nitroglycerine but I don't see where this pt has been on this medication. Can she have it?

## 2021-02-24 ENCOUNTER — Telehealth: Payer: Self-pay | Admitting: Cardiology

## 2021-02-24 NOTE — Telephone Encounter (Signed)
Patient was recently seen in the ED for chest pain. The first available appt I was able to fine was for April, patient would like to be seen before that for follow up.

## 2021-02-24 NOTE — Telephone Encounter (Signed)
Patient scheduled for march 3rd 2023

## 2021-02-25 NOTE — Telephone Encounter (Signed)
Patient scheduled for Monday 02/28/21

## 2021-02-28 ENCOUNTER — Other Ambulatory Visit: Payer: Self-pay

## 2021-02-28 ENCOUNTER — Encounter: Payer: Self-pay | Admitting: Cardiology

## 2021-02-28 ENCOUNTER — Ambulatory Visit (INDEPENDENT_AMBULATORY_CARE_PROVIDER_SITE_OTHER): Payer: 59 | Admitting: Cardiology

## 2021-02-28 VITALS — BP 98/60 | HR 74 | Ht 60.0 in | Wt 253.6 lb

## 2021-02-28 DIAGNOSIS — I10 Essential (primary) hypertension: Secondary | ICD-10-CM

## 2021-02-28 DIAGNOSIS — Z794 Long term (current) use of insulin: Secondary | ICD-10-CM

## 2021-02-28 DIAGNOSIS — Z6841 Body Mass Index (BMI) 40.0 and over, adult: Secondary | ICD-10-CM

## 2021-02-28 DIAGNOSIS — E1165 Type 2 diabetes mellitus with hyperglycemia: Secondary | ICD-10-CM | POA: Diagnosis not present

## 2021-02-28 DIAGNOSIS — R079 Chest pain, unspecified: Secondary | ICD-10-CM

## 2021-02-28 DIAGNOSIS — Z951 Presence of aortocoronary bypass graft: Secondary | ICD-10-CM | POA: Diagnosis not present

## 2021-02-28 NOTE — Addendum Note (Signed)
Addended by: Hazle Quant on: 02/28/2021 10:39 AM   Modules accepted: Orders

## 2021-02-28 NOTE — Patient Instructions (Signed)
Medication Instructions:  Your physician recommends that you continue on your current medications as directed. Please refer to the Current Medication list given to you today.  *If you need a refill on your cardiac medications before your next appointment, please call your pharmacy*   Lab Work: None If you have labs (blood work) drawn today and your tests are completely normal, you will receive your results only by: MyChart Message (if you have MyChart) OR A paper copy in the mail If you have any lab test that is abnormal or we need to change your treatment, we will call you to review the results.   Testing/Procedures:   [facility] [address] [place], [place] [address] Phone:  [phone]    Please arrive 15 minutes prior to your appointment time for registration and insurance purposes.  The test will take approximately 3 to 4 hours to complete; you may bring reading material.  If someone comes with you to your appointment, they will need to remain in the main lobby due to limited space in the testing area. **If you are pregnant or breastfeeding, please notify the nuclear lab prior to your appointment**  How to prepare for your Myocardial Perfusion Test: Do not eat or drink 3 hours prior to your test, except you may have water. Do not consume products containing caffeine (regular or decaffeinated) 12 hours prior to your test. (ex: coffee, chocolate, sodas, tea). Do bring a list of your current medications with you.  If not listed below, you may take your medications as normal.  Do wear comfortable clothes (no dresses or overalls) and walking shoes, tennis shoes preferred (No heels or open toe shoes are allowed). Do NOT wear cologne, perfume, aftershave, or lotions (deodorant is allowed). If these instructions are not followed, your test will have to be rescheduled.  Please report to [address] for your test.  If you have questions or  concerns about your appointment, you can call the [facility] Lab at [phone].  If you cannot keep your appointment, please provide 24 hours notification to the Nuclear Lab, to avoid a possible $50 charge to your account.    Follow-Up: At [facility], you and your health needs are our priority.  As part of our continuing mission to provide you with exceptional heart care, we have created designated Provider Care Teams.  These Care Teams include your primary Cardiologist (physician) and Advanced Practice Providers (APPs -  Physician Assistants and Nurse Practitioners) who all work together to provide you with the care you need, when you need it.  We recommend signing up for the patient portal called "MyChart".  Sign up information is provided on this After Visit Summary.  MyChart is used to connect with patients for Virtual Visits (Telemedicine).  Patients are able to view lab/test results, encounter notes, upcoming appointments, etc.  Non-urgent messages can be sent to your provider as well.   To learn more about what you can do with MyChart, go to [email or web address].    Your next appointment:   3 month(s)  The format for your next appointment:   In Person  Provider:   [name], MD    Other Instructions  Cardiac Nuclear Scan A cardiac nuclear scan is a test that measures blood flow to the heart when a person is resting and when he or she is exercising. The test looks for problems such as: Not enough blood reaching a portion of the heart.  The heart muscle not working normally. You may need this test if: You have heart disease. You have had abnormal lab results. You have had heart surgery or a balloon procedure to open up blocked arteries (angioplasty). You have chest pain. You have shortness of breath. In this test, a radioactive dye (tracer) is injected into your bloodstream. After the tracer has traveled to your heart, an imaging  device is used to measure how much of the tracer is absorbed by or distributed to various areas of your heart. This procedure is usually done at a hospital and takes 2-4 hours. Tell a health care provider about: Any allergies you have. All medicines you are taking, including vitamins, herbs, eye drops, creams, and over-the-counter medicines. Any problems you or family members have had with anesthetic medicines. Any blood disorders you have. Any surgeries you have had. Any medical conditions you have. Whether you are pregnant or may be pregnant. What are the risks? Generally, this is a safe procedure. However, problems may occur, including: Serious chest pain and heart attack. This is only a risk if the stress portion of the test is done. Rapid heartbeat. Sensation of warmth in your chest. This usually passes quickly. Allergic reaction to the tracer. What happens before the procedure? Ask your health care provider about changing or stopping your regular medicines. This is especially important if you are taking diabetes medicines or blood thinners. Follow instructions from your health care provider about eating or drinking restrictions. Remove your jewelry on the day of the procedure. What happens during the procedure? An IV will be inserted into one of your veins. Your health care provider will inject a small amount of radioactive tracer through the IV. You will wait for 20-40 minutes while the tracer travels through your bloodstream. Your heart activity will be monitored with an electrocardiogram (ECG). You will lie down on an exam table. Images of your heart will be taken for about 15-20 minutes. You may also have a stress test. For this test, one of the following may be done: You will exercise on a treadmill or stationary bike. While you exercise, your heart's activity will be monitored with an ECG, and your blood pressure will be checked. You will be given medicines that will increase  blood flow to parts of your heart. This is done if you are unable to exercise. When blood flow to your heart has peaked, a tracer will again be injected through the IV. After 20-40 minutes, you will get back on the exam table and have more images taken of your heart. Depending on the type of tracer used, scans may need to be repeated 3-4 hours later. Your IV line will be removed when the procedure is over. The procedure may vary among health care providers and hospitals. What happens after the procedure? Unless your health care provider tells you otherwise, you may return to your normal schedule, including diet, activities, and medicines. Unless your health care provider tells you otherwise, you may increase your fluid intake. This will help to flush the contrast dye from your body. Drink enough fluid to keep your urine pale yellow. Ask your health care provider, or the department that is doing the test: When will my results be ready? How will I get my results? Summary A cardiac nuclear scan measures the blood flow to the heart when a person is resting and when he or she is exercising. Tell your health care provider if you are pregnant. Before the procedure, ask your  health care provider about changing or stopping your regular medicines. This is especially important if you are taking diabetes medicines or blood thinners. After the procedure, unless your health care provider tells you otherwise, increase your fluid intake. This will help flush the contrast dye from your body. After the procedure, unless your health care provider tells you otherwise, you may return to your normal schedule, including diet, activities, and medicines. This information is not intended to replace advice given to you by your health care provider. Make sure you discuss any questions you have with your health care provider. Document Revised: 11/24/2020 Document Reviewed: 08/25/2020 Elsevier Patient Education  [place].

## 2021-02-28 NOTE — Progress Notes (Signed)
Cardiology Office Note:    Date:  02/28/2021   ID:  [name], DOB 09-Jul-1975, MRN [id number]  PCP:  Bonnita Nasuti, MD  Cardiologist:  Jenne Campus, MD    Referring MD: Bonnita Nasuti, MD   Chief Complaint  Patient presents with   Follow-up  I had a chest pain and I was in the emergency room  History of Present Illness:    Beverly [name] is a 45 y.o. female past medical history significant for poorly controlled diabetes, essential hypertension, dyslipidemia, morbid obesity, status post gastric bypass surgery years ago, status post recent coronary artery bypass graft.  She also had history of stenting done in 2009.  After that no follow-up.  She came to hospital because of intermittent chest pain cardiac catheterization has been done and then she ended up having coronary artery bypass graft with LIMA to LAD SVG to ramus intermediate, SVG to PDA that was done on June 19, 2019.  Recovery was complicated by DVT. She is in my office today to discuss recent visit to the emergency room.  Apparently she was in the morning busy doing stuff and started experiencing tightness in the chest went to hospital she ruled out for myocardial infarction she was advised to follow-up with [place].  Since the time of hospitalization which was week ago she did not have any more chest pain.  Denies have any chest pain tightness squeezing pressure burning chest.  Past Medical History:  Diagnosis Date   Anemia 06/17/2019   Anxiety disorder 06/14/2020   CAD (coronary artery disease) 06/16/2019   CAD (coronary artery disease), native coronary artery 07/08/2012   Cellulitis 06/14/2020   Chest pain 06/16/2019   Diabetes mellitus ([place]) 09/23/2019   Diabetes mellitus without complication ([place])    Dyslipidemia 09/23/2019   Encounter for long-term (current) use of insulin ([place]) 07/08/2012   Gastro-esophageal reflux disease without esophagitis 06/14/2020   Hematuria 07/08/2012   History of DVT (deep vein thrombosis)     Hyperglycemia due to type 2 diabetes mellitus ([place]) 06/14/2020   Hyperlipidemia 07/08/2012   Hypertension    Hypokalemia 06/17/2019   Hypothyroidism 06/14/2020   Idiopathic peripheral autonomic neuropathy 06/14/2020   Insulin-requiring or dependent type II diabetes mellitus ([place])    Multiple thyroid nodules    Nephrolithiasis 07/08/2012   Obesity    Old myocardial infarction 06/14/2020   Positive D dimer    Primary generalized (osteo)arthritis 06/14/2020   S/P bariatric surgery 02/19/2014   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   Type 2 diabetes mellitus with diabetic polyneuropathy, with long-term current use of insulin ([place]) 09/23/2019   Type 2 diabetes mellitus with hyperglycemia, with long-term current use of insulin ([place]) 09/23/2019   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, with long-term current use of insulin ([place]) 09/23/2019   Type II diabetes mellitus ([place]) 07/08/2012   Unstable angina ([place])    Vitamin D deficiency 06/14/2020    Past Surgical History:  Procedure Laterality Date   APPLICATION OF WOUND VAC N/A 06/19/2019   Procedure: Application Of Wound Vac USING PREVENA INCISIONAL DRESSING;  Surgeon: Melrose Nakayama, MD;  Location: [place];  Service: Open Heart Surgery;  Laterality: N/A;   Cardiac Stent  2009   CORONARY ARTERY BYPASS GRAFT N/A 06/19/2019   Procedure: CORONARY ARTERY BYPASS GRAFTING (CABG) x 3 WITH ENDOSCOPIC HARVESTING OF LEFT GREATER SAPHENOUS VEIN. LIMA TO LAD, SVG TO PD, SVG TO RAMUS;  Surgeon: Melrose Nakayama, MD;  Location: [facility];  Service: Open Heart Surgery;  Laterality: N/A;   LEFT HEART CATH AND CORONARY ANGIOGRAPHY N/A 06/18/2019   Procedure: LEFT HEART CATH AND CORONARY ANGIOGRAPHY;  Surgeon: Leonie Man, MD;  Location: [place] [facility];  Service: Cardiovascular;  Laterality: N/A;   TEE WITHOUT CARDIOVERSION N/A 06/19/2019   Procedure: TRANSESOPHAGEAL ECHOCARDIOGRAM (TEE);  Surgeon: Melrose Nakayama, MD;  Location: [facility];  Service: Open  Heart Surgery;  Laterality: N/A;    Current Medications: Current Meds  Medication Sig   acetaminophen (TYLENOL) 500 MG tablet Take 1,000 mg by mouth as needed for moderate pain.   aluminum-magnesium hydroxide-simethicone (MAALOX) I037812 MG/5ML SUSP Take 30 mLs by mouth 3 (three) times daily as needed (for gas/indigestion).    aspirin EC 81 MG tablet Take 1 tablet (81 mg total) by mouth daily.   atorvastatin (LIPITOR) 80 MG tablet 1 TABLET BY MOUTH DAILY AT 6 PM (Patient taking differently: Take 80 mg by mouth daily.)   B-D UF III MINI PEN NEEDLES 31G X 5 MM MISC 1 each by Other route See admin instructions. Injection   carvedilol (COREG) 12.5 MG tablet Take 12.5 mg by mouth in the morning.    empagliflozin (JARDIANCE) 25 MG TABS tablet Take 1 tablet (25 mg total) by mouth daily before breakfast.   ezetimibe (ZETIA) 10 MG tablet Take 1 tablet (10 mg total) by mouth daily.   fluconazole (DIFLUCAN) 200 MG tablet Take 1 tablet by mouth daily.   furosemide (LASIX) 40 MG tablet Take 1 tablet (40 mg total) by mouth daily. (Patient taking differently: Take 40 mg by mouth daily as needed for fluid.)   gabapentin (NEURONTIN) 300 MG capsule Take 1 capsule (300 mg total) by mouth 3 (three) times daily.   insulin glargine (LANTUS SOLOSTAR) 100 UNIT/ML Solostar Pen Inject 50 Units into the skin daily.   insulin lispro (HUMALOG KWIKPEN) 100 UNIT/ML KwikPen Inject 0.12 mLs (12 Units total) into the skin 3 (three) times daily.   Insulin Pen Needle 29G X 5MM MISC 1 Device by Does not apply route as directed. (Patient taking differently: 1 Device by Does not apply route as directed. injection)   Multiple Vitamins-Calcium (ONE-A-DAY WOMENS FORMULA) TABS Take 1 tablet by mouth daily with breakfast. Unknown strength   nitroGLYCERIN (NITROSTAT) 0.4 MG SL tablet Place 1 tablet (0.4 mg total) under the tongue every 5 (five) minutes as needed. (Patient taking differently: Place 0.4 mg under the tongue every 5 (five)  minutes as needed for chest pain.)   olmesartan (BENICAR) 40 MG tablet Take 40 mg by mouth daily.   OZEMPIC, 0.25 OR 0.5 MG/DOSE, 2 MG/1.5ML SOPN Inject 0.5 mg into the skin once a week.   simethicone (MYLICON) 0000000 MG chewable tablet Chew 125 mg by mouth every 6 (six) hours as needed for flatulence.     Allergies:   Shellfish-derived products and Tape   Social History   Socioeconomic History   Marital status: Married    Spouse name: Not on file   Number of children: Not on file   Years of education: Not on file   Highest education level: Not on file  Occupational History   Not on file  Tobacco Use   Smoking status: Never   Smokeless tobacco: Never  Vaping Use   Vaping Use: Never used  Substance and Sexual Activity   Alcohol use: Never   Drug use: Never   Sexual activity: Yes  Other Topics Concern   Not on file  Social History  Narrative   Not on file   Social Determinants of Health   Financial Resource Strain: Not on file  Food Insecurity: Not on file  Transportation Needs: Not on file  Physical Activity: Not on file  Stress: Not on file  Social Connections: Not on file     Family History: The patient's family history includes Diabetes Mellitus II in her father and mother; Heart disease in her father and maternal grandmother; Hypertension in her father and mother. ROS:   Please see the history of present illness.    All 14 point review of systems negative except as described per history of present illness  EKGs/Labs/Other Studies Reviewed:      Recent Labs: No results found for requested labs within last 8760 hours.  Recent Lipid Panel    Component Value Date/Time   CHOL 139 08/07/2019 0932   TRIG 82 08/07/2019 0932   HDL 50 08/07/2019 0932   CHOLHDL 2.8 08/07/2019 0932   CHOLHDL 6.1 06/17/2019 0500   VLDL 23 06/17/2019 0500   LDLCALC 73 08/07/2019 0932    Physical Exam:    VS:  BP 98/60 (BP Location: Left Arm, Patient Position: Sitting)   Pulse 74    Ht 5' (1.524 m)   Wt 253 lb 9.6 oz (115 kg)   SpO2 98%   BMI 49.53 kg/m     Wt Readings from Last 3 Encounters:  02/28/21 253 lb 9.6 oz (115 kg)  12/16/20 251 lb (113.9 kg)  06/15/20 249 lb (112.9 kg)     GEN:  Well nourished, well developed in no acute distress HEENT: Normal NECK: No JVD; No carotid bruits LYMPHATICS: No lymphadenopathy CARDIAC: RRR, no murmurs, no rubs, no gallops RESPIRATORY:  Clear to auscultation without rales, wheezing or rhonchi  ABDOMEN: Soft, non-tender, non-distended MUSCULOSKELETAL:  No edema; No deformity  SKIN: Warm and dry LOWER EXTREMITIES: no swelling NEUROLOGIC:  Alert and oriented x 3 PSYCHIATRIC:  Normal affect   ASSESSMENT:    1. S/P CABG x 3   2. Class 3 severe obesity due to excess calories with serious comorbidity and body mass index (BMI) of 45.0 to 49.9 in adult ([place])   3. Type 2 diabetes mellitus with hyperglycemia, with long-term current use of insulin ([place])   4. Primary hypertension    PLAN:    In order of problems listed above:  Coronary disease recent admission to hospital because of chest pain.  Ruled out for myocardial infarction.  However because of her multiple risk factors some not well modified which includes diabetes I will schedule her to have a stress test to make sure she does not have any inducible ischemia Type 2 diabetes I did review K PN which show me her hemoglobin A1c of 7.9 this is from September 2021.  She does have appoint with her primary care physician within the next 2 weeks for that problem. Dyslipidemia, she is on high intense statin last fasting lipid profile from K PN showing LDL of 73 HDL 50.  We will make arrangements for fasting lipid profile to be reviewed. Essential hypertension, with facing up is the problem blood pressure being low. I did review ER visit for this appointment.   Medication Adjustments/Labs and Tests Ordered: Current medicines are reviewed at length with the patient today.   Concerns regarding medicines are outlined above.  No orders of the defined types were placed in this encounter.  Medication changes: No orders of the defined types were placed in this encounter.  Signed, Georgeanna Lea, MD, [facility] 02/28/2021 10:27 AM    [facility]

## 2021-03-02 NOTE — Addendum Note (Signed)
Addended by: [name] on: 03/02/2021 12:41 PM   Modules accepted: Orders

## 2021-04-12 ENCOUNTER — Ambulatory Visit: Payer: 59

## 2021-04-13 ENCOUNTER — Ambulatory Visit: Payer: 59

## 2021-05-26 ENCOUNTER — Ambulatory Visit: Payer: 59 | Admitting: Cardiology

## 2021-05-26 DIAGNOSIS — A681 Tick-borne relapsing fever: Secondary | ICD-10-CM | POA: Insufficient documentation

## 2021-05-27 ENCOUNTER — Ambulatory Visit: Payer: Managed Care, Other (non HMO) | Admitting: Cardiology

## 2021-05-27 ENCOUNTER — Encounter: Payer: Self-pay | Admitting: Cardiology

## 2021-05-27 ENCOUNTER — Other Ambulatory Visit: Payer: Self-pay

## 2021-05-27 VITALS — BP 130/70 | HR 70 | Ht 60.0 in | Wt 255.0 lb

## 2021-05-27 DIAGNOSIS — E1165 Type 2 diabetes mellitus with hyperglycemia: Secondary | ICD-10-CM

## 2021-05-27 DIAGNOSIS — Z951 Presence of aortocoronary bypass graft: Secondary | ICD-10-CM | POA: Diagnosis not present

## 2021-05-27 DIAGNOSIS — Z9884 Bariatric surgery status: Secondary | ICD-10-CM

## 2021-05-27 DIAGNOSIS — E782 Mixed hyperlipidemia: Secondary | ICD-10-CM

## 2021-05-27 DIAGNOSIS — I10 Essential (primary) hypertension: Secondary | ICD-10-CM

## 2021-05-27 DIAGNOSIS — Z794 Long term (current) use of insulin: Secondary | ICD-10-CM

## 2021-05-27 NOTE — Progress Notes (Signed)
Cardiology Office Note:    Date:  05/27/2021   ID:  Beverly [name], DOB Jul 22, 1975, MRN [id number]  PCP:  [name], MD  Cardiologist:  [name], MD    Referring MD: [name], MD   Chief Complaint  Patient presents with   Follow-up  I am doing fine but have a pain in my leg  History of Present Illness:    Beverly [name] is a 46 y.o. female   past medical history significant for poorly controlled diabetes, essential hypertension, dyslipidemia, morbid obesity, status post gastric bypass surgery years ago, status post recent coronary artery bypass graft.  She also had history of stenting done in 2009.  After that no follow-up.  She came to hospital because of intermittent chest pain cardiac catheterization has been done and then she ended up having coronary artery bypass graft with LIMA to LAD SVG to ramus intermediate, SVG to PDA that was done on June 19, 2019.  Recovery was complicated by DVT. Comes today 2 months for follow-up.  Cardiac wise seems to be doing well denies have any chest pain tightness squeezing pressure burning chest.  However, she complain of having pain in her calf she said this exactly the same thing she had when she had her DVT.  Both of her legs got 1+ swelling.  I do not see any red marks on her leg.  Past Medical History:  Diagnosis Date   Anemia 06/17/2019   Anxiety disorder 06/14/2020   CAD (coronary artery disease) 06/16/2019   CAD (coronary artery disease), native coronary artery 07/08/2012   Cellulitis 06/14/2020   Chest pain 06/16/2019   Diabetes mellitus (HCC) 09/23/2019   Diabetes mellitus without complication (HCC)    Dyslipidemia 09/23/2019   Encounter for long-term (current) use of insulin (H[facility]) 07/08/2012   Gastro-esophageal reflux disease without esophagitis 06/14/2020   Hematuria 07/08/2012   History of DVT (deep vein thrombosis)    Hyperglycemia due to type 2 diabetes mellitus (HCC) 06/14/2020   Hyperlipidemia 07/08/2012   Hypertension     Hypokalemia 06/17/2019   Hypothyroidism 06/14/2020   Idiopathic peripheral autonomic neuropathy 06/14/2020   Insulin-requiring or dependent type II diabetes mellitus (HCC)    Multiple thyroid nodules    Nephrolithiasis 07/08/2012   Obesity    Old myocardial infarction 06/14/2020   Positive D dimer    Primary generalized (osteo)arthritis 06/14/2020   S/P bariatric surgery 02/19/2014   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   Type 2 diabetes mellitus with diabetic polyneuropathy, with long-term current use of insulin (HCC) 09/23/2019   Type 2 diabetes mellitus with hyperglycemia, with long-term current use of insulin (HCC) 09/23/2019   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, with long-term current use of insulin (HCC) 09/23/2019   Type II diabetes mellitus (HCC) 07/08/2012   Unstable angina (HCC)    Vitamin D deficiency 06/14/2020    Past Surgical History:  Procedure Laterality Date   APPLICATION OF WOUND VAC N/A 06/19/2019   Procedure: Application Of Wound Vac USING PREVENA INCISIONAL DRESSING;  Surgeon: [name], MD;  Location: [facility];  Service: Open Heart Surgery;  Laterality: N/A;   Cardiac Stent  2009   CORONARY ARTERY BYPASS GRAFT N/A 06/19/2019   Procedure: CORONARY ARTERY BYPASS GRAFTING (CABG) x 3 WITH ENDOSCOPIC HARVESTING OF LEFT GREATER SAPHENOUS VEIN. LIMA TO LAD, SVG TO PD, SVG TO RAMUS;  Surgeon: [name], MD;  Location: [facility];  Service: Open Heart Surgery;  Laterality: N/A;  LEFT HEART CATH AND CORONARY ANGIOGRAPHY N/A 06/18/2019   Procedure: LEFT HEART CATH AND CORONARY ANGIOGRAPHY;  Surgeon: [name], MD;  [facility];  Service: Cardiovascular;  Laterality: N/A;   TEE WITHOUT CARDIOVERSION N/A 06/19/2019   Procedure: TRANSESOPHAGEAL ECHOCARDIOGRAM (TEE);  Surgeon: [name], MD;  Location: [facility] OR;  Service: Open Heart Surgery;  Laterality: N/A;    Current Medications: Current Meds  Medication Sig   acetaminophen  (TYLENOL) 500 MG tablet Take 1,000 mg by mouth as needed for moderate pain.   aluminum-magnesium hydroxide-simethicone (MAALOX) 200-200-20 MG/5ML SUSP Take 30 mLs by mouth 3 (three) times daily as needed (for gas/indigestion).    aspirin EC 81 MG tablet Take 1 tablet (81 mg total) by mouth daily.   atorvastatin (LIPITOR) 80 MG tablet 1 TABLET BY MOUTH DAILY AT 6 PM   carvedilol (COREG) 12.5 MG tablet Take 12.5 mg by mouth in the morning.    empagliflozin (JARDIANCE) 25 MG TABS tablet Take 1 tablet (25 mg total) by mouth daily before breakfast.   furosemide (LASIX) 40 MG tablet Take 40 mg by mouth as needed for fluid or edema.   gabapentin (NEURONTIN) 300 MG capsule Take 1 capsule (300 mg total) by mouth 3 (three) times daily.   insulin glargine (LANTUS SOLOSTAR) 100 UNIT/ML Solostar Pen Inject 50 Units into the skin daily.   insulin lispro (HUMALOG KWIKPEN) 100 UNIT/ML KwikPen Inject 0.12 mLs (12 Units total) into the skin 3 (three) times daily.   Multiple Vitamins-Calcium (ONE-A-DAY WOMENS FORMULA) TABS Take 1 tablet by mouth daily with breakfast. Unknown strength   nitroGLYCERIN (NITROSTAT) 0.4 MG SL tablet Place 1 tablet (0.4 mg total) under the tongue every 5 (five) minutes as needed for chest pain.   olmesartan (BENICAR) 40 MG tablet Take 40 mg by mouth daily.   simethicone (MYLICON) 125 MG chewable tablet Chew 125 mg by mouth every 6 (six) hours as needed for flatulence.     Allergies:   Shellfish-derived products and Tape   Social History   Socioeconomic History   Marital status: Married    Spouse name: Not on file   Number of children: Not on file   Years of education: Not on file   Highest education level: Not on file  Occupational History   Not on file  Tobacco Use   Smoking status: Never   Smokeless tobacco: Never  Vaping Use   Vaping Use: Never used  Substance and Sexual Activity   Alcohol use: Never   Drug use: Never   Sexual activity: Yes  Other Topics Concern    Not on file  Social History Narrative   Not on file   Social Determinants of Health   Financial Resource Strain: Not on file  Food Insecurity: Not on file  Transportation Needs: Not on file  Physical Activity: Not on file  Stress: Not on file  Social Connections: Not on file     Family History: The patient's family history includes Diabetes Mellitus II in her father and mother; Heart disease in her father and maternal grandmother; Hypertension in her father and mother. ROS:   Please see the history of present illness.    All 14 point review of systems negative except as described per history of present illness  EKGs/Labs/Other Studies Reviewed:      Recent Labs: No results found for requested labs within last 8760 hours.  Recent Lipid Panel    Component Value Date/Time   CHOL 139  08/07/2019 0932   TRIG 82 08/07/2019 0932   HDL 50 08/07/2019 0932   CHOLHDL 2.8 08/07/2019 0932   CHOLHDL 6.1 06/17/2019 0500   VLDL 23 06/17/2019 0500   LDLCALC 73 08/07/2019 0932    Physical Exam:    VS:  BP 130/70 (BP Location: Right Arm, Patient Position: Sitting, Cuff Size: Normal)    Pulse 70    Ht 5' (1.524 m)    Wt 255 lb (115.7 kg)    SpO2 99%    BMI 49.80 kg/m     Wt Readings from Last 3 Encounters:  05/27/21 255 lb (115.7 kg)  02/28/21 253 lb 9.6 oz (115 kg)  12/16/20 251 lb (113.9 kg)     GEN:  Well nourished, well developed in no acute distress HEENT: Normal NECK: No JVD; No carotid bruits LYMPHATICS: No lymphadenopathy CARDIAC: RRR, no murmurs, no rubs, no gallops RESPIRATORY:  Clear to auscultation without rales, wheezing or rhonchi  ABDOMEN: Soft, non-tender, non-distended MUSCULOSKELETAL:  No edema; No deformity  SKIN: Warm and dry LOWER EXTREMITIES: 1+ swelling bilaterally, tenderness in the left calf noted NEUROLOGIC:  Alert and oriented x 3 PSYCHIATRIC:  Normal affect   ASSESSMENT:    1. S/P CABG x 3   2. S/P bariatric surgery   3. Mixed hyperlipidemia    4. Primary hypertension   5. Type 2 diabetes mellitus with hyperglycemia, with long-term current use of insulin (HCC)    PLAN:    In order of problems listed above:  Status post coronary bypass graft seems to be doing well from that point review she did not have a stress test done.  But now she is asymptomatic Status post bariatric surgery sadly she is gaining weight. Mixed dyslipidemia, she is on high intense statin which I will continue as she is on Lipitor 80 as well as Zetia 10 mg daily.  I did review K PN which show me her last LDL of 73 HDL 50.  However this is dated from 2021 we will check fasting lipid profile. Essential hypertension, blood pressure well controlled continue present management. Diabetes mellitus she tells me again that her diabetes is poorly controlled I have data from before which hemoglobin A1c 7.9.  I will refer her to endocrinology. Leg pain with some swelling suspicion for DVT.  Ultrasounds of the leg will be done today   Medication Adjustments/Labs and Tests Ordered: Current medicines are reviewed at length with the patient today.  Concerns regarding medicines are outlined above.  No orders of the defined types were placed in this encounter.  Medication changes: No orders of the defined types were placed in this encounter.   Signed, Georgeanna Lea, MD, [facility] 05/27/2021 11:31 AM    [facility]

## 2021-05-27 NOTE — Patient Instructions (Signed)
Medication Instructions:  ?Your physician recommends that you continue on your current medications as directed. Please refer to the Current Medication list given to you today. ? ?*If you need a refill on your cardiac medications before your next appointment, please call your pharmacy* ? ? ?Lab Work: ?None ?If you have labs (blood work) drawn today and your tests are completely normal, you will receive your results only by: ?MyChart Message (if you have MyChart) OR ?A paper copy in the mail ?If you have any lab test that is abnormal or we need to change your treatment, we will call you to review the results. ? ? ?Testing/Procedures: ?Ultrasound of lower extremities at [facility] ? ? ?Follow-Up: ?At [facility], you and your health needs are our priority.  As part of our continuing mission to provide you with exceptional heart care, we have created designated Provider Care Teams.  These Care Teams include your primary Cardiologist (physician) and Advanced Practice Providers (APPs -  Physician Assistants and Nurse Practitioners) who all work together to provide you with the care you need, when you need it. ? ?We recommend signing up for the patient portal called "MyChart".  Sign up information is provided on this After Visit Summary.  MyChart is used to connect with patients for Virtual Visits (Telemedicine).  Patients are able to view lab/test results, encounter notes, upcoming appointments, etc.  Non-urgent messages can be sent to your provider as well.   ?To learn more about what you can do with MyChart, go to [email or web address].   ? ?Your next appointment:   ?3 month(s) ? ?The format for your next appointment:   ?In Person ? ?Provider:   ?[name], MD  ? ? ?Other Instructions ?None ? ?

## 2021-09-08 ENCOUNTER — Other Ambulatory Visit: Payer: Self-pay

## 2021-09-13 ENCOUNTER — Encounter: Payer: Self-pay | Admitting: Cardiology

## 2021-09-13 ENCOUNTER — Ambulatory Visit (INDEPENDENT_AMBULATORY_CARE_PROVIDER_SITE_OTHER): Payer: Commercial Managed Care - HMO | Admitting: Cardiology

## 2021-09-13 VITALS — BP 136/78 | HR 79 | Ht 60.0 in | Wt 252.8 lb

## 2021-09-13 DIAGNOSIS — I25110 Atherosclerotic heart disease of native coronary artery with unstable angina pectoris: Secondary | ICD-10-CM | POA: Diagnosis not present

## 2021-09-13 DIAGNOSIS — Z951 Presence of aortocoronary bypass graft: Secondary | ICD-10-CM

## 2021-09-13 DIAGNOSIS — I10 Essential (primary) hypertension: Secondary | ICD-10-CM

## 2021-09-13 DIAGNOSIS — Z9884 Bariatric surgery status: Secondary | ICD-10-CM

## 2021-09-13 NOTE — Progress Notes (Signed)
Cardiology Office Note:    Date:  09/13/2021   ID:  Beverly [name], DOB 06/09/1975, MRN [id number]  PCP:  [name], MD  Cardiologist:  [name], MD    Referring MD: [name], MD   Chief Complaint  Patient presents with   Follow-up    History of Present Illness:    Beverly [name] is a 46 y.o. female past medical history significant for poorly controlled diabetes, essential hypertension, dyslipidemia, morbid obesity, status post gastric bypass surgery years ago, status post recent coronary artery bypass graft.  She also had history of stenting done in 2009.  After that no follow-up.  She came to hospital because of intermittent chest pain cardiac catheterization has been done and then she ended up having coronary artery bypass graft with LIMA to LAD SVG to ramus intermediate, SVG to PDA that was done on June 19, 2019.  Recovery was complicated by DVT. In the meantime she ended up being in the emergency room look like she did have pancreatitis.  She was also told to have multiple hernias that required surgery at the same time to talk about potentially revising her gastric bypass surgery. Cardiac wise doing well denies have any chest pain tightness squeezing pressure burning chest try to be active however does have difficulty doing activities.  Past Medical History:  Diagnosis Date   Anemia 06/17/2019   Anxiety disorder 06/14/2020   CAD (coronary artery disease) 06/16/2019   CAD (coronary artery disease), native coronary artery 07/08/2012   Cellulitis 06/14/2020   Chest pain 06/16/2019   Diabetes mellitus (HCC) 09/23/2019   Diabetes mellitus without complication (HCC)    Dyslipidemia 09/23/2019   Encounter for long-term (current) use of insulin (HCC) 07/08/2012   Gastro-esophageal reflux disease without esophagitis 06/14/2020   Hematuria 07/08/2012   History of DVT (deep vein thrombosis)    Hyperglycemia due to type 2 diabetes mellitus (HCC) 06/14/2020   Hyperlipidemia  07/08/2012   Hypertension    Hypokalemia 06/17/2019   Hypothyroidism 06/14/2020   Idiopathic peripheral autonomic neuropathy 06/14/2020   Insulin-requiring or dependent type II diabetes mellitus (HCC)    Multiple thyroid nodules    Nephrolithiasis 07/08/2012   Obesity    Old myocardial infarction 06/14/2020   Positive D dimer    Primary generalized (osteo)arthritis 06/14/2020   S/P bariatric surgery 02/19/2014   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   Type 2 diabetes mellitus with diabetic polyneuropathy, with long-term current use of insulin (HCC) 09/23/2019   Type 2 diabetes mellitus with hyperglycemia, with long-term current use of insulin (HCC) 09/23/2019   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, with long-term current use of insulin (HCC) 09/23/2019   Type II diabetes mellitus (HCC) 07/08/2012   Unstable angina (HCC)    Vitamin D deficiency 06/14/2020    Past Surgical History:  Procedure Laterality Date   APPLICATION OF WOUND VAC N/A 06/19/2019   Procedure: Application Of Wound Vac USING PREVENA INCISIONAL DRESSING;  Surgeon: [name], MD;  Location: [facility] OR;  Service: Open Heart Surgery;  Laterality: N/A;   Cardiac Stent  2009   CORONARY ARTERY BYPASS GRAFT N/A 06/19/2019   Procedure: CORONARY ARTERY BYPASS GRAFTING (CABG) x 3 WITH ENDOSCOPIC HARVESTING OF LEFT GREATER SAPHENOUS VEIN. LIMA TO LAD, SVG TO PD, SVG TO RAMUS;  Surgeon: [name], MD;  Location: [facility] OR;  Service: Open Heart Surgery;  Laterality: N/A;   LEFT HEART CATH AND CORONARY ANGIOGRAPHY N/A 06/18/2019   Procedure:  LEFT HEART CATH AND CORONARY ANGIOGRAPHY;  Surgeon: [name], MD;  Location[facility] INVAS[facility] CV LAB;  Service: Cardiovascular;  Laterality: N/A;   TEE WITHOUT CARDIOVERSION N/A 06/19/2019   Procedure: TRANSESOPHAGEAL ECHOCARDIOGRAM (TEE);  Surgeon: [name], MD;  Location: [facility] OR;  Service: Open Heart Surgery;  Laterality: N/A;    Current Medications: Current Meds   Medication Sig   acetaminophen (TYLENOL) 500 MG tablet Take 1,000 mg by mouth as needed for moderate pain.   aluminum-magnesium hydroxide-simethicone (MAALOX) 200-200-20 MG/5ML SUSP Take 30 mLs by mouth 3 (three) times daily as needed (for gas/indigestion).    aspirin EC 81 MG tablet Take 1 tablet (81 mg total) by mouth daily.   atorvastatin (LIPITOR) 80 MG tablet 1 TABLET BY MOUTH DAILY AT 6 PM   carvedilol (COREG) 12.5 MG tablet Take 12.5 mg by mouth in the morning.    dapagliflozin propanediol (FARXIGA) 10 MG TABS tablet Take 1 tablet by mouth daily.   Dulaglutide (TRULICITY) 0.75 MG/0.5ML SOPN Inject 0.75 mg into the skin once a week.   gabapentin (NEURONTIN) 300 MG capsule Take 1 capsule (300 mg total) by mouth 3 (three) times daily.   insulin glargine (LANTUS SOLOSTAR) 100 UNIT/ML Solostar Pen Inject 50 Units into the skin daily.   insulin lispro (HUMALOG KWIKPEN) 100 UNIT/ML KwikPen Inject 0.12 mLs (12 Units total) into the skin 3 (three) times daily.   losartan (COZAAR) 25 MG tablet Take 12.5 mg by mouth daily.   Multiple Vitamins-Calcium (ONE-A-DAY WOMENS FORMULA) TABS Take 1 tablet by mouth daily with breakfast. Unknown strength   nitroGLYCERIN (NITROSTAT) 0.4 MG SL tablet Place 1 tablet (0.4 mg total) under the tongue every 5 (five) minutes as needed for chest pain.   olmesartan (BENICAR) 40 MG tablet Take 40 mg by mouth daily.   simethicone (MYLICON) 125 MG chewable tablet Chew 125 mg by mouth every 6 (six) hours as needed for flatulence.   simvastatin (ZOCOR) 80 MG tablet Take 80 mg by mouth at bedtime.     Allergies:   Shellfish allergy, Shellfish-derived products, and Tape   Social History   Socioeconomic History   Marital status: Married    Spouse name: Not on file   Number of children: Not on file   Years of education: Not on file   Highest education level: Not on file  Occupational History   Not on file  Tobacco Use   Smoking status: Never   Smokeless tobacco:  Never  Vaping Use   Vaping Use: Never used  Substance and Sexual Activity   Alcohol use: Never   Drug use: Never   Sexual activity: Yes  Other Topics Concern   Not on file  Social History Narrative   Not on file   Social Determinants of Health   Financial Resource Strain: Not on file  Food Insecurity: Not on file  Transportation Needs: Not on file  Physical Activity: Not on file  Stress: Not on file  Social Connections: Not on file     Family History: The patient's family history includes Diabetes Mellitus II in her father and mother; Heart disease in her father and maternal grandmother; Hypertension in her father and mother. ROS:   Please see the history of present illness.    All 14 point review of systems negative except as described per history of present illness  EKGs/Labs/Other Studies Reviewed:      Recent Labs: No results found for requested labs within last 365 days.  Recent  Lipid Panel    Component Value Date/Time   CHOL 139 08/07/2019 0932   TRIG 82 08/07/2019 0932   HDL 50 08/07/2019 0932   CHOLHDL 2.8 08/07/2019 0932   CHOLHDL 6.1 06/17/2019 0500   VLDL 23 06/17/2019 0500   LDLCALC 73 08/07/2019 0932    Physical Exam:    VS:  BP 136/78 (BP Location: Left Arm, Patient Position: Sitting)   Pulse 79   Ht 5' (1.524 m)   Wt 252 lb 12.8 oz (114.7 kg)   SpO2 98%   BMI 49.37 kg/m     Wt Readings from Last 3 Encounters:  09/13/21 252 lb 12.8 oz (114.7 kg)  05/27/21 255 lb (115.7 kg)  02/28/21 253 lb 9.6 oz (115 kg)     GEN:  Well nourished, well developed in no acute distress HEENT: Normal NECK: No JVD; No carotid bruits LYMPHATICS: No lymphadenopathy CARDIAC: RRR, no murmurs, no rubs, no gallops RESPIRATORY:  Clear to auscultation without rales, wheezing or rhonchi  ABDOMEN: Soft, non-tender, non-distended MUSCULOSKELETAL:  No edema; No deformity  SKIN: Warm and dry LOWER EXTREMITIES: no swelling NEUROLOGIC:  Alert and oriented x  3 PSYCHIATRIC:  Normal affect   ASSESSMENT:    1. Coronary artery disease involving native coronary artery of native heart with unstable angina pectoris (HCC)   2. Primary hypertension   3. S/P bariatric surgery   4. S/P CABG x 3    PLAN:    In order of problems listed above:  Coronary artery disease stable from that point review on antiplatelet therapy high intense statin which I will continue. Dyslipidemia I did review K PN which show LDL of 73 HDL 50 this is data from 2021.  She tells me her primary care physician just did blood test results in March.  We will get a copy of it.  In the meantime we will continue high intensity statin. Status post coronary artery bypass graft.  Noted. Diabetes mellitus I do have her last hemoglobin A1c 7.9 this is from 2021 she said recently her hemoglobin A1c is much better.  We will try to get a copy of it. Overall is a very sad situation.  Lady with premature coronary artery disease poorly controlled diabetes.  Hopefully will be able to get better control of those problems and improve her 1-2 50. She is scheduled for potential surgery for hernias I asked her to let me know when she decide to do it because if she will then will probably end up doing stress test and echocardiogram on her   Medication Adjustments/Labs and Tests Ordered: Current medicines are reviewed at length with the patient today.  Concerns regarding medicines are outlined above.  No orders of the defined types were placed in this encounter.  Medication changes: No orders of the defined types were placed in this encounter.   Signed, Georgeanna Lea, MD, [facility] 09/13/2021 2:48 PM    [facility]

## 2021-09-13 NOTE — Patient Instructions (Signed)
Medication Instructions:  Your physician recommends that you continue on your current medications as directed. Please refer to the Current Medication list given to you today.  *If you need a refill on your cardiac medications before your next appointment, please call your pharmacy*   Lab Work: NONE If you have labs (blood work) drawn today and your tests are completely normal, you will receive your results only by: MyChart Message (if you have MyChart) OR A paper copy in the mail If you have any lab test that is abnormal or we need to change your treatment, we will call you to review the results.   Testing/Procedures: NONE   Follow-Up: At [facility], you and your health needs are our priority.  As part of our continuing mission to provide you with exceptional heart care, we have created designated Provider Care Teams.  These Care Teams include your primary Cardiologist (physician) and Advanced Practice Providers (APPs -  Physician Assistants and Nurse Practitioners) who all work together to provide you with the care you need, when you need it.  We recommend signing up for the patient portal called "MyChart".  Sign up information is provided on this After Visit Summary.  MyChart is used to connect with patients for Virtual Visits (Telemedicine).  Patients are able to view lab/test results, encounter notes, upcoming appointments, etc.  Non-urgent messages can be sent to your provider as well.   To learn more about what you can do with MyChart, go to [email or web address].    Your next appointment:   6 month(s)  The format for your next appointment:   In Person  Provider:   Robert Krasowski, MD    Other Instructions   Important Information About Sugar       

## 2021-10-27 ENCOUNTER — Encounter: Payer: Self-pay | Admitting: Cardiology

## 2021-10-27 ENCOUNTER — Ambulatory Visit: Payer: Commercial Managed Care - HMO | Admitting: Cardiology

## 2021-10-27 VITALS — BP 112/68 | HR 83 | Ht 60.0 in | Wt 259.6 lb

## 2021-10-27 DIAGNOSIS — Z951 Presence of aortocoronary bypass graft: Secondary | ICD-10-CM | POA: Diagnosis not present

## 2021-10-27 DIAGNOSIS — Z794 Long term (current) use of insulin: Secondary | ICD-10-CM

## 2021-10-27 DIAGNOSIS — Z9884 Bariatric surgery status: Secondary | ICD-10-CM | POA: Diagnosis not present

## 2021-10-27 DIAGNOSIS — E1142 Type 2 diabetes mellitus with diabetic polyneuropathy: Secondary | ICD-10-CM

## 2021-10-27 DIAGNOSIS — I10 Essential (primary) hypertension: Secondary | ICD-10-CM

## 2021-10-27 DIAGNOSIS — E782 Mixed hyperlipidemia: Secondary | ICD-10-CM | POA: Diagnosis not present

## 2021-10-27 DIAGNOSIS — I252 Old myocardial infarction: Secondary | ICD-10-CM | POA: Diagnosis not present

## 2021-10-27 DIAGNOSIS — Z01818 Encounter for other preprocedural examination: Secondary | ICD-10-CM

## 2021-10-27 NOTE — Patient Instructions (Signed)
Medication Instructions:  Your physician recommends that you continue on your current medications as directed. Please refer to the Current Medication list given to you today.  *If you need a refill on your cardiac medications before your next appointment, please call your pharmacy*   Lab Work: None ordered If you have labs (blood work) drawn today and your tests are completely normal, you will receive your results only by: MyChart Message (if you have MyChart) OR A paper copy in the mail If you have any lab test that is abnormal or we need to change your treatment, we will call you to review the results.   Testing/Procedures: Your physician has requested that you have a lexiscan myoview. For further information please visit [email or web address] Please follow instruction sheet, as given.  The test will take approximately 3 to 4 hours to complete; you may bring reading material.  If someone comes with you to your appointment, they will need to remain in the main lobby due to limited space in the testing area. **If you are pregnant or breastfeeding, please notify the nuclear lab prior to your appointment**  How to prepare for your Myocardial Perfusion Test: Do not eat or drink 3 hours prior to your test, except you may have water. Do not consume products containing caffeine (regular or decaffeinated) 12 hours prior to your test. (ex: coffee, chocolate, sodas, tea). Do bring a list of your current medications with you.  If not listed below, you may take your medications as normal. Do wear comfortable clothes (no dresses or overalls) and walking shoes, tennis shoes preferred (No heels or open toe shoes are allowed). Do NOT wear cologne, perfume, aftershave, or lotions (deodorant is allowed). If these instructions are not followed, your test will have to be rescheduled.  Your physician has requested that you have an echocardiogram. Echocardiography is a painless test that uses sound waves to  create images of your heart. It provides your doctor with information about the size and shape of your heart and how well your heart's chambers and valves are working. This procedure takes approximately one hour. There are no restrictions for this procedure.   Follow-Up: At [facility], you and your health needs are our priority.  As part of our continuing mission to provide you with exceptional heart care, we have created designated Provider Care Teams.  These Care Teams include your primary Cardiologist (physician) and Advanced Practice Providers (APPs -  Physician Assistants and Nurse Practitioners) who all work together to provide you with the care you need, when you need it.  We recommend signing up for the patient portal called "MyChart".  Sign up information is provided on this After Visit Summary.  MyChart is used to connect with patients for Virtual Visits (Telemedicine).  Patients are able to view lab/test results, encounter notes, upcoming appointments, etc.  Non-urgent messages can be sent to your provider as well.   To learn more about what you can do with MyChart, go to [email or web address].    Your next appointment:   6 month(s)  The format for your next appointment:   In Person  Provider:   [name], MD   Other Instructions Cardiac Nuclear Scan A cardiac nuclear scan is a test that is done to check the flow of blood to your heart. It is done when you are resting and when you are exercising. The test looks for problems such as: Not enough blood reaching a portion of the heart. The heart muscle not working as  it should. You may need this test if: You have heart disease. You have had lab results that are not normal. You have had heart surgery or a balloon procedure to open up blocked arteries (angioplasty). You have chest pain. You have shortness of breath. In this test, a special dye (tracer) is put into your bloodstream. The tracer will travel to your heart.  A camera will then take pictures of your heart to see how the tracer moves through your heart. This test is usually done at a hospital and takes 2-4 hours. Tell a doctor about: Any allergies you have. All medicines you are taking, including vitamins, herbs, eye drops, creams, and over-the-counter medicines. Any problems you or family members have had with anesthetic medicines. Any blood disorders you have. Any surgeries you have had. Any medical conditions you have. Whether you are pregnant or may be pregnant. What are the risks? Generally, this is a safe test. However, problems may occur, such as: Serious chest pain and heart attack. This is only a risk if the stress portion of the test is done. Rapid heartbeat. A feeling of warmth in your chest. This feeling usually does not last long. Allergic reaction to the tracer. What happens before the test? Ask your doctor about changing or stopping your normal medicines. This is important. Follow instructions from your doctor about what you cannot eat or drink. Remove your jewelry on the day of the test. What happens during the test? An IV tube will be inserted into one of your veins. Your doctor will give you a small amount of tracer through the IV tube. You will wait for 20-40 minutes while the tracer moves through your bloodstream. Your heart will be monitored with an electrocardiogram (ECG). You will lie down on an exam table. Pictures of your heart will be taken for about 15-20 minutes. You may also have a stress test. For this test, one of these things may be done: You will be asked to exercise on a treadmill or a stationary bike. You will be given medicines that will make your heart work harder. This is done if you are unable to exercise. When blood flow to your heart has peaked, a tracer will again be given through the IV tube. After 20-40 minutes, you will get back on the exam table. More pictures will be taken of your  heart. Depending on the tracer that is used, more pictures may need to be taken 3-4 hours later. Your IV tube will be removed when the test is over. The test may vary among doctors and hospitals. What happens after the test? Ask your doctor: Whether you can return to your normal schedule, including diet, activities, and medicines. Whether you should drink more fluids. This will help to remove the tracer from your body. Drink enough fluid to keep your pee (urine) pale yellow. Ask your doctor, or the department that is doing the test: When will my results be ready? How will I get my results? Summary A cardiac nuclear scan is a test that is done to check the flow of blood to your heart. Tell your doctor whether you are pregnant or may be pregnant. Before the test, ask your doctor about changing or stopping your normal medicines. This is important. Ask your doctor whether you can return to your normal activities. You may be asked to drink more fluids. This information is not intended to replace advice given to you by your health care provider. Make sure you discuss any  questions you have with your health care provider. Document Revised: 07/03/2018 Document Reviewed: 08/27/2017 Elsevier Patient Education  2021 Elsevier Inc.    Echocardiogram An echocardiogram is a test that uses sound waves (ultrasound) to produce images of the heart. Images from an echocardiogram can provide important information about: Heart size and shape. The size and thickness and movement of your heart's walls. Heart muscle function and strength. Heart valve function or if you have stenosis. Stenosis is when the heart valves are too narrow. If blood is flowing backward through the heart valves (regurgitation). A tumor or infectious growth around the heart valves. Areas of heart muscle that are not working well because of poor blood flow or injury from a heart attack. Aneurysm detection. An aneurysm is a weak or  damaged part of an artery wall. The wall bulges out from the normal force of blood pumping through the body. Tell a health care provider about: Any allergies you have. All medicines you are taking, including vitamins, herbs, eye drops, creams, and over-the-counter medicines. Any blood disorders you have. Any surgeries you have had. Any medical conditions you have. Whether you are pregnant or may be pregnant. What are the risks? Generally, this is a safe test. However, problems may occur, including an allergic reaction to dye (contrast) that may be used during the test. What happens before the test? No specific preparation is needed. You may eat and drink normally. What happens during the test? You will take off your clothes from the waist up and put on a hospital gown. Electrodes or electrocardiogram (ECG)patches may be placed on your chest. The electrodes or patches are then connected to a device that monitors your heart rate and rhythm. You will lie down on a table for an ultrasound exam. A gel will be applied to your chest to help sound waves pass through your skin. A handheld device, called a transducer, will be pressed against your chest and moved over your heart. The transducer produces sound waves that travel to your heart and bounce back (or "echo" back) to the transducer. These sound waves will be captured in real-time and changed into images of your heart that can be viewed on a video monitor. The images will be recorded on a computer and reviewed by your health care provider. You may be asked to change positions or hold your breath for a short time. This makes it easier to get different views or better views of your heart. In some cases, you may receive contrast through an IV in one of your veins. This can improve the quality of the pictures from your heart. The procedure may vary among health care providers and hospitals.    What can I expect after the test? You may return to your  normal, everyday life, including diet, activities, and medicines, unless your health care provider tells you not to do that. Follow these instructions at home: It is up to you to get the results of your test. Ask your health care provider, or the department that is doing the test, when your results will be ready. Keep all follow-up visits. This is important. Summary An echocardiogram is a test that uses sound waves (ultrasound) to produce images of the heart. Images from an echocardiogram can provide important information about the size and shape of your heart, heart muscle function, heart valve function, and other possible heart problems. You do not need to do anything to prepare before this test. You may eat and drink normally. After  the echocardiogram is completed, you may return to your normal, everyday life, unless your health care provider tells you not to do that. This information is not intended to replace advice given to you by your health care provider. Make sure you discuss any questions you have with your health care provider. Document Revised: 11/04/2019 Document Reviewed: 11/04/2019 Elsevier Patient Education  2021 [employer].

## 2021-10-27 NOTE — Progress Notes (Signed)
Cardiology Office Note:    Date:  10/27/2021   ID:  Beverly [name], DOB 04/11/1975, MRN [id number]  PCP:  [name], MD  Cardiologist:  [name], MD    Referring MD: [name], MD   Chief Complaint  Patient presents with   Clearance 11/25/2021    [facility] Repair Dr. Dossie Der    History of Present Illness:    Beverly [name] is a 46 y.o. female   female past medical history significant for poorly controlled diabetes, essential hypertension, dyslipidemia, morbid obesity, status post gastric bypass surgery years ago, status post recent coronary artery bypass graft.  She also had history of stenting done in 2009.  After that no follow-up.  She came to hospital because of intermittent chest pain cardiac catheterization has been done and then she ended up having coronary artery bypass graft with LIMA to LAD SVG to ramus intermediate, SVG to PDA that was done on June 19, 2019.  Recovery was complicated by DVT. In the meantime she ended up being in the emergency room look like she did have pancreatitis.  She was also told to have multiple hernias that required surgery at the same time to talk about potentially revising her gastric bypass surgery. She comes today to talk about the surgery.  She is already scheduled to have it done she need to be evaluated from cardiac standpoint review and to have risk assessment for the surgery.  She said overall that she is doing well she get tired easily while walking.  Denies have any chest pain tightness squeezing pressure burning chest, however, even previously before her bypass surgery she did not have typical symptoms.  Past Medical History:  Diagnosis Date   Anemia 06/17/2019   Anxiety disorder 06/14/2020   CAD (coronary artery disease) 06/16/2019   CAD (coronary artery disease), native coronary artery 07/08/2012   Cellulitis 06/14/2020   Chest pain 06/16/2019   Diabetes mellitus (HCC) 09/23/2019   Diabetes mellitus without complication  (HCC)    Dyslipidemia 09/23/2019   Encounter for long-term (current) use of insulin (HCC) 07/08/2012   Gastro-esophageal reflux disease without esophagitis 06/14/2020   Hematuria 07/08/2012   History of DVT (deep vein thrombosis)    Hyperglycemia due to type 2 diabetes mellitus (HCC) 06/14/2020   Hyperlipidemia 07/08/2012   Hypertension    Hypokalemia 06/17/2019   Hypothyroidism 06/14/2020   Idiopathic peripheral autonomic neuropathy 06/14/2020   Insulin-requiring or dependent type II diabetes mellitus (HCC)    Multiple thyroid nodules    Nephrolithiasis 07/08/2012   Obesity    Old myocardial infarction 06/14/2020   Positive D dimer    Primary generalized (osteo)arthritis 06/14/2020   S/P bariatric surgery 02/19/2014   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   Type 2 diabetes mellitus with diabetic polyneuropathy, with long-term current use of insulin (HCC) 09/23/2019   Type 2 diabetes mellitus with hyperglycemia, with long-term current use of insulin (HCC) 09/23/2019   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, with long-term current use of insulin (HCC) 09/23/2019   Type II diabetes mellitus (HCC) 07/08/2012   Unstable angina (HCC)    Vitamin D deficiency 06/14/2020    Past Surgical History:  Procedure Laterality Date   APPLICATION OF WOUND VAC N/A 06/19/2019   Procedure: Application Of Wound Vac USING PREVENA INCISIONAL DRESSING;  Surgeon: [name], MD;  Location: [facility] OR;  Service: Open Heart Surgery;  Laterality: N/A;   Cardiac Stent  2009   CORONARY ARTERY BYPASS GRAFT  N/A 06/19/2019   Procedure: CORONARY ARTERY BYPASS GRAFTING (CABG) x 3 WITH ENDOSCOPIC HARVESTING OF LEFT GREATER SAPHENOUS VEIN. LIMA TO LAD, SVG TO PD, SVG TO RAMUS;  Surgeon: [name], MD;  Location: [facility] OR;  Service: Open Heart Surgery;  Laterality: N/A;   LEFT HEART CATH AND CORONARY ANGIOGRAPHY N/A 06/18/2019   Procedure: LEFT HEART CATH AND CORONARY ANGIOGRAPHY;  Surgeon: [name], MD;   Location: [facility] INVASIVE CV LAB;  Service: Cardiovascular;  Laterality: N/A;   TEE WITHOUT CARDIOVERSION N/A 06/19/2019   Procedure: TRANSESOPHAGEAL ECHOCARDIOGRAM (TEE);  Surgeon: [name], MD;  Location: [facility] OR;  Service: Open Heart Surgery;  Laterality: N/A;    Current Medications: Current Meds  Medication Sig   acetaminophen (TYLENOL) 500 MG tablet Take 1,000 mg by mouth as needed for moderate pain.   aluminum-magnesium hydroxide-simethicone (MAALOX) 200-200-20 MG/5ML SUSP Take 30 mLs by mouth 3 (three) times daily as needed (for gas/indigestion).    aspirin EC 81 MG tablet Take 1 tablet (81 mg total) by mouth daily.   atorvastatin (LIPITOR) 80 MG tablet 1 TABLET BY MOUTH DAILY AT 6 PM (Patient taking differently: Take 80 mg by mouth daily.)   carvedilol (COREG) 12.5 MG tablet Take 12.5 mg by mouth in the morning.    dapagliflozin propanediol (FARXIGA) 10 MG TABS tablet Take 1 tablet by mouth daily.   Dulaglutide (TRULICITY) 0.75 MG/0.5ML SOPN Inject 1.5 mg into the skin once a week.   gabapentin (NEURONTIN) 300 MG capsule Take 1 capsule (300 mg total) by mouth 3 (three) times daily.   insulin glargine (LANTUS SOLOSTAR) 100 UNIT/ML Solostar Pen Inject 50 Units into the skin daily.   insulin lispro (HUMALOG KWIKPEN) 100 UNIT/ML KwikPen Inject 0.12 mLs (12 Units total) into the skin 3 (three) times daily.   losartan (COZAAR) 25 MG tablet Take 12.5 mg by mouth daily.   Multiple Vitamins-Calcium (ONE-A-DAY WOMENS FORMULA) TABS Take 1 tablet by mouth daily with breakfast. Unknown strength   nitroGLYCERIN (NITROSTAT) 0.4 MG SL tablet Place 1 tablet (0.4 mg total) under the tongue every 5 (five) minutes as needed for chest pain.   olmesartan (BENICAR) 40 MG tablet Take 40 mg by mouth daily.   simethicone (MYLICON) 125 MG chewable tablet Chew 125 mg by mouth every 6 (six) hours as needed for flatulence.   simvastatin (ZOCOR) 80 MG tablet Take 80 mg by mouth at bedtime.     Allergies:    Shellfish allergy, Shellfish-derived products, and Tape   Social History   Socioeconomic History   Marital status: Married    Spouse name: Not on file   Number of children: Not on file   Years of education: Not on file   Highest education level: Not on file  Occupational History   Not on file  Tobacco Use   Smoking status: Never   Smokeless tobacco: Never  Vaping Use   Vaping Use: Never used  Substance and Sexual Activity   Alcohol use: Never   Drug use: Never   Sexual activity: Yes  Other Topics Concern   Not on file  Social History Narrative   Not on file   Social Determinants of Health   Financial Resource Strain: Not on file  Food Insecurity: Not on file  Transportation Needs: Not on file  Physical Activity: Not on file  Stress: Not on file  Social Connections: Not on file     Family History: The patient's family history includes Diabetes Mellitus II in  her father and mother; Heart disease in her father and maternal grandmother; Hypertension in her father and mother. ROS:   Please see the history of present illness.    All 14 point review of systems negative except as described per history of present illness  EKGs/Labs/Other Studies Reviewed:      Recent Labs: No results found for requested labs within last 365 days.  Recent Lipid Panel    Component Value Date/Time   CHOL 139 08/07/2019 0932   TRIG 82 08/07/2019 0932   HDL 50 08/07/2019 0932   CHOLHDL 2.8 08/07/2019 0932   CHOLHDL 6.1 06/17/2019 0500   VLDL 23 06/17/2019 0500   LDLCALC 73 08/07/2019 0932    Physical Exam:    VS:  BP 112/68 (BP Location: Left Arm, Patient Position: Sitting)   Pulse 83   Ht 5' (1.524 m)   Wt 259 lb 9.6 oz (117.8 kg)   SpO2 98%   BMI 50.70 kg/m     Wt Readings from Last 3 Encounters:  10/27/21 259 lb 9.6 oz (117.8 kg)  09/13/21 252 lb 12.8 oz (114.7 kg)  05/27/21 255 lb (115.7 kg)     GEN:  Well nourished, well developed in no acute distress HEENT:  Normal NECK: No JVD; No carotid bruits LYMPHATICS: No lymphadenopathy CARDIAC: RRR, no murmurs, no rubs, no gallops RESPIRATORY:  Clear to auscultation without rales, wheezing or rhonchi  ABDOMEN: Soft, non-tender, non-distended MUSCULOSKELETAL:  No edema; No deformity  SKIN: Warm and dry LOWER EXTREMITIES: no swelling NEUROLOGIC:  Alert and oriented x 3 PSYCHIATRIC:  Normal affect   ASSESSMENT:    1. S/P CABG x 3   2. S/P bariatric surgery   3. Mixed hyperlipidemia   4. Type 2 diabetes mellitus with diabetic polyneuropathy, with long-term current use of insulin ([place])   5. Old myocardial infarction   6. Primary hypertension    PLAN:    In order of problems listed above:  Status post coronary artery bypass graft.  It was done in 2021 but because of poorly controlled diabetes which is still ongoing and high risk of reactivation of the problem I will schedule her to have Lexiscan she will require 2 days protocol make sure she does not have any inducible ischemia as a part of evaluation before planned surgery she will also have echocardiogram done to assess left ventricle ejection fraction. Status post bariatric surgery.  Sadly she still gaining weight. Mixed dyslipidemia she is already on high intensity statin however I do not see any recent cholesterol checked she is telling me that her primary care physician dated but she does not know what the results of this is.  I will call primary care physician to get report of this. Diabetes which is still poorly controlled with her hemoglobin A1c number of 8.6.  Which is better than it was before she does have appoint with endocrinologist however that appointment is in November.  Clearly her diabetes need to be better controlled. Cardiovascular evaluation before surgery.  Will have to evaluate her with echocardiogram and stress testing which we will do.   Medication Adjustments/Labs and Tests Ordered: Current medicines are reviewed at length  with the patient today.  Concerns regarding medicines are outlined above.  No orders of the defined types were placed in this encounter.  Medication changes: No orders of the defined types were placed in this encounter.   Signed, Park Liter, MD, [facility] 10/27/2021 9:00 AM    [place]  [facility]

## 2021-11-02 ENCOUNTER — Ambulatory Visit: Payer: Self-pay | Admitting: Surgery

## 2021-11-08 ENCOUNTER — Telehealth: Payer: Self-pay

## 2021-11-08 NOTE — Telephone Encounter (Signed)
Attempted to contact the pact with all the contact numbers. Will try again later. S.Medford Staheli EMTP

## 2021-11-08 NOTE — Telephone Encounter (Signed)
Patient returned call

## 2021-11-10 ENCOUNTER — Telehealth (HOSPITAL_COMMUNITY): Payer: Self-pay | Admitting: *Deleted

## 2021-11-10 NOTE — Telephone Encounter (Signed)
Patient given detailed instructions per Myocardial Perfusion Study Information Sheet for the test on 11/15/21 at 8:15. Patient notified to arrive 15 minutes early and that it is imperative to arrive on time for appointment to keep from having the test rescheduled.  If you need to cancel or reschedule your appointment, please call the office within 24 hours of your appointment. . Patient verbalized understanding.Daneil Dolin

## 2021-11-15 ENCOUNTER — Ambulatory Visit (INDEPENDENT_AMBULATORY_CARE_PROVIDER_SITE_OTHER): Payer: Commercial Managed Care - HMO

## 2021-11-15 DIAGNOSIS — I10 Essential (primary) hypertension: Secondary | ICD-10-CM

## 2021-11-15 DIAGNOSIS — I252 Old myocardial infarction: Secondary | ICD-10-CM

## 2021-11-15 DIAGNOSIS — Z01818 Encounter for other preprocedural examination: Secondary | ICD-10-CM

## 2021-11-15 DIAGNOSIS — Z951 Presence of aortocoronary bypass graft: Secondary | ICD-10-CM

## 2021-11-15 LAB — ECHOCARDIOGRAM COMPLETE
AR max vel: 1.31 cm2
AV Area VTI: 1.28 cm2
AV Area mean vel: 1.34 cm2
AV Mean grad: 10.5 mmHg
AV Peak grad: 19.5 mmHg
Ao pk vel: 2.21 m/s
Area-P 1/2: 3.97 cm2
Height: 60 in
S' Lateral: 3 cm
Weight: 4144 oz

## 2021-11-15 MED ORDER — TECHNETIUM TC 99M TETROFOSMIN IV KIT
32.6000 | PACK | Freq: Once | INTRAVENOUS | Status: AC | PRN
  Administered 2021-11-15: 32.6 via INTRAVENOUS

## 2021-11-15 MED ORDER — REGADENOSON 0.4 MG/5ML IV SOLN
0.4000 mg | Freq: Once | INTRAVENOUS | Status: AC
  Administered 2021-11-15: 0.4 mg via INTRAVENOUS

## 2021-11-15 MED ORDER — PERFLUTREN LIPID MICROSPHERE
1.0000 mL | INTRAVENOUS | Status: AC | PRN
  Administered 2021-11-15: 6 mL via INTRAVENOUS

## 2021-11-15 NOTE — Progress Notes (Addendum)
Anesthesia Review:  PCP: DR Alvester Morin  Cardiologist : DR Armando Gang 10/27/21  Chest x-ray : EKG : 10/27/21  and requested 12 lead ekg tracing done at  [facility]  on 11/08/21.   by fax  EKG 11/08/21 on chart  Echo : 11/15/21  Stress test: 11/15/21  Cardiac Cath :  Activity level: can do a flgiht of stairs without difficutly  Sleep Study/ CPAP :has sleep apnea no cpap  Fasting Blood Sugar :      / Checks Blood Sugar -- times a day:   Blood Thinner/ Instructions /Last Dose: ASA / Instructions/ Last Dose :   81 mg Aspirin  DM- type 2- checks glucose 3x daily  Hgba1c-  10/13/21- 8.9  per pt - done with Dr Ardelle Park- have called and [facility] for result to be faxed.   11/08/21- CBC/Diff and CMp- in epic  HX of CABG  IN ED on 11/08/21 with chest wall discomfort  at [facility]  2009- stents 2021- cabg

## 2021-11-15 NOTE — Patient Instructions (Signed)
SURGICAL WAITING ROOM VISITATION Patients having surgery or a procedure may have no more than 2 support people in the waiting area - these visitors may rotate.   Children under the age of 14 must have an adult with them who is not the patient. If the patient needs to stay at the hospital during part of their recovery, the visitor guidelines for inpatient rooms apply. Pre-op nurse will coordinate an appropriate time for 1 support person to accompany patient in pre-op.  This support person may not rotate.    Please refer to the [facility] website for the visitor guidelines for Inpatients (after your surgery is over and you are in a regular room).       Your procedure is scheduled on:  11/25/2021    Report to [facility] Main Entrance    Report to admitting at   1030AM   Call this number if you have problems the morning of surgery [phone]   Do not eat food :After Midnight.   After Midnight you may have the following liquids until ___ 0930___ AM DAY OF SURGERY  Water Non-Citrus Juices (without pulp, NO RED) Carbonated Beverages Black Coffee (NO MILK/CREAM OR CREAMERS, sugar ok)  Clear Tea (NO MILK/CREAM OR CREAMERS, sugar ok) regular and decaf                             Plain Jell-O (NO RED)                                           Fruit ices (not with fruit pulp, NO RED)                                     Popsicles (NO RED)                                                               Sports drinks like Gatorade (NO RED)                    The day of surgery:  Drink ONE (1) Pre-Surgery Clear Ensure or G2 at  0930AM ( have completed by )  the morning of surgery. Drink in one sitting. Do not sip.  This drink was given to you during your hospital  pre-op appointment visit. Nothing else to drink after completing the  Pre-Surgery Clear Ensure or G2.           Oral Hygiene is also important to reduce your risk of infection.                                     Remember - BRUSH YOUR TEETH THE MORNING OF SURGERY WITH YOUR REGULAR TOOTHPASTE   Do NOT smoke after Midnight   Take these medicines the morning of surgery with A SIP OF WATER:  coreg, gabapentin        DO NOT TAKE ANY ORAL DIABETIC MEDICATIONS DAY OF YOUR SURGERY  Bring CPAP mask and tubing day of surgery.                              You may not have any metal on your body including hair pins, jewelry, and body piercing             Do not wear make-up, lotions, powders, perfumes/cologne, or deodorant  Do not wear nail polish including gel and S&S, artificial/acrylic nails, or any other type of covering on natural nails including finger and toenails. If you have artificial nails, gel coating, etc. that needs to be removed by a nail salon please have this removed prior to surgery or surgery may need to be canceled/ delayed if the surgeon/ anesthesia feels like they are unable to be safely monitored.   Do not shave  48 hours prior to surgery.               Men may shave face and neck.   Do not bring valuables to the hospital. [place].   Contacts, dentures or bridgework may not be worn into surgery.   Bring small overnight bag day of surgery.   DO NOT [place]. PHARMACY WILL DISPENSE MEDICATIONS LISTED ON YOUR MEDICATION LIST TO YOU DURING YOUR ADMISSION [place]!    Patients discharged on the day of surgery will not be allowed to drive home.  Someone NEEDS to stay with you for the first 24 hours after anesthesia.   Special Instructions: Bring a copy of your healthcare power of attorney and living will documents         the day of surgery if you haven't scanned them before.              Please read over the following fact sheets you were given: IF YOU HAVE QUESTIONS ABOUT YOUR PRE-OP INSTRUCTIONS PLEASE CALL [phone]     [facility] - Preparing for Surgery Before surgery, you can  play an important role.  Because skin is not sterile, your skin needs to be as free of germs as possible.  You can reduce the number of germs on your skin by washing with CHG (chlorahexidine gluconate) soap before surgery.  CHG is an antiseptic cleaner which kills germs and bonds with the skin to continue killing germs even after washing. Please DO NOT use if you have an allergy to CHG or antibacterial soaps.  If your skin becomes reddened/irritated stop using the CHG and inform your nurse when you arrive at Short Stay. Do not shave (including legs and underarms) for at least 48 hours prior to the first CHG shower.  You may shave your face/neck. Please follow these instructions carefully:  1.  Shower with CHG Soap the night before surgery and the  morning of Surgery.  2.  If you choose to wash your hair, wash your hair first as usual with your  normal  shampoo.  3.  After you shampoo, rinse your hair and body thoroughly to remove the  shampoo.                           4.  Use CHG as you would any other liquid soap.  You can apply chg directly  to the skin and wash  Gently with a scrungie or clean washcloth.  5.  Apply the CHG Soap to your body ONLY FROM THE NECK DOWN.   Do not use on face/ open                           Wound or open sores. Avoid contact with eyes, ears mouth and genitals (private parts).                       Wash face,  Genitals (private parts) with your normal soap.             6.  Wash thoroughly, paying special attention to the area where your surgery  will be performed.  7.  Thoroughly rinse your body with warm water from the neck down.  8.  DO NOT shower/wash with your normal soap after using and rinsing off  the CHG Soap.                9.  Pat yourself dry with a clean towel.            10.  Wear clean pajamas.            11.  Place clean sheets on your bed the night of your first shower and do not  sleep with pets. Day of Surgery : Do not apply any  lotions/deodorants the morning of surgery.  Please wear clean clothes to the hospital/surgery center.  FAILURE TO FOLLOW THESE INSTRUCTIONS MAY RESULT IN THE CANCELLATION OF YOUR SURGERY PATIENT SIGNATURE_________________________________  NURSE SIGNATURE__________________________________  ________________________________________________________________________

## 2021-11-16 ENCOUNTER — Ambulatory Visit: Payer: Commercial Managed Care - HMO

## 2021-11-16 LAB — MYOCARDIAL PERFUSION IMAGING
LV dias vol: 84 mL (ref 46–106)
LV sys vol: 35 mL
Nuc Stress EF: 59 %
Peak HR: 86 {beats}/min
Rest HR: 72 {beats}/min
Rest Nuclear Isotope Dose: 30 mCi
SDS: 2
SRS: 9
SSS: 11
ST Depression (mm): 0 mm
Stress Nuclear Isotope Dose: 32.6 mCi
TID: 1.2

## 2021-11-16 MED ORDER — TECHNETIUM TC 99M TETROFOSMIN IV KIT
30.0000 | PACK | Freq: Once | INTRAVENOUS | Status: AC | PRN
  Administered 2021-11-16: 30 via INTRAVENOUS

## 2021-11-18 ENCOUNTER — Other Ambulatory Visit: Payer: Self-pay

## 2021-11-18 ENCOUNTER — Encounter (HOSPITAL_COMMUNITY)
Admission: RE | Admit: 2021-11-18 | Discharge: 2021-11-18 | Disposition: A | Discharge status: Home / Self Care | Payer: Commercial Managed Care - HMO | Source: Ambulatory Visit | Attending: Surgery | Admitting: Surgery

## 2021-11-18 ENCOUNTER — Encounter (HOSPITAL_COMMUNITY): Payer: Self-pay

## 2021-11-18 VITALS — BP 126/73 | HR 76 | Temp 98.3°F | Resp 16 | Ht 60.0 in | Wt 249.0 lb

## 2021-11-18 DIAGNOSIS — Z794 Long term (current) use of insulin: Secondary | ICD-10-CM | POA: Insufficient documentation

## 2021-11-18 DIAGNOSIS — K432 Incisional hernia without obstruction or gangrene: Secondary | ICD-10-CM | POA: Insufficient documentation

## 2021-11-18 DIAGNOSIS — E1151 Type 2 diabetes mellitus with diabetic peripheral angiopathy without gangrene: Secondary | ICD-10-CM | POA: Insufficient documentation

## 2021-11-18 DIAGNOSIS — Z86718 Personal history of other venous thrombosis and embolism: Secondary | ICD-10-CM | POA: Diagnosis not present

## 2021-11-18 DIAGNOSIS — I350 Nonrheumatic aortic (valve) stenosis: Secondary | ICD-10-CM | POA: Insufficient documentation

## 2021-11-18 DIAGNOSIS — Z951 Presence of aortocoronary bypass graft: Secondary | ICD-10-CM | POA: Insufficient documentation

## 2021-11-18 DIAGNOSIS — Z01812 Encounter for preprocedural laboratory examination: Secondary | ICD-10-CM | POA: Insufficient documentation

## 2021-11-18 DIAGNOSIS — I2510 Atherosclerotic heart disease of native coronary artery without angina pectoris: Secondary | ICD-10-CM | POA: Insufficient documentation

## 2021-11-18 DIAGNOSIS — Z9884 Bariatric surgery status: Secondary | ICD-10-CM | POA: Insufficient documentation

## 2021-11-18 DIAGNOSIS — I10 Essential (primary) hypertension: Secondary | ICD-10-CM | POA: Diagnosis not present

## 2021-11-18 DIAGNOSIS — Z01818 Encounter for other preprocedural examination: Secondary | ICD-10-CM

## 2021-11-18 HISTORY — DX: Personal history of urinary calculi: Z87.442

## 2021-11-18 HISTORY — DX: Peripheral vascular disease, unspecified: I73.9

## 2021-11-18 HISTORY — DX: Sleep apnea, unspecified: G47.30

## 2021-11-18 LAB — GLUCOSE, CAPILLARY: Glucose-Capillary: 267 mg/dL — ABNORMAL HIGH (ref 70–99)

## 2021-11-21 NOTE — Anesthesia Preprocedure Evaluation (Addendum)
Anesthesia Evaluation  Patient identified by MRN, date of birth, ID band Patient awake    Reviewed: Allergy & Precautions, NPO status , Patient's Chart, lab work & pertinent test results, reviewed documented beta blocker date and time   Airway Mallampati: III  TM Distance: >3 FB Neck ROM: Full    Dental  (+) Dental Advisory Given, Missing, Chipped, Poor Dentition   Pulmonary sleep apnea ,    Pulmonary exam normal breath sounds clear to auscultation       Cardiovascular hypertension, Pt. on home beta blockers and Pt. on medications + angina + CAD, + Past MI, + Cardiac Stents and + Peripheral Vascular Disease   Rhythm:Regular Rate:Normal + Systolic murmurs Echo 10/2021 1. Left ventricular ejection fraction, by estimation, is 60 to 65%. The left ventricle has normal function. The left ventricle has no regional wall motion abnormalities. There is mild left ventricular hypertrophy. Left ventricular diastolic parameters are consistent with Grade II diastolic dysfunction (pseudonormalization).  2. Right ventricular systolic function is normal. The right ventricular size is normal.  3. The mitral valve is normal in structure. No evidence of mitral valve regurgitation. No evidence of mitral stenosis.  4. The aortic valve is calcified. Aortic valve regurgitation is not visualized. Mild aortic valve stenosis. Aortic valve mean gradient measures 10.5 mmHg.  5. The inferior vena cava is normal in size with greater than 50% respiratory variability, suggesting right atrial pressure of 3 mmHg.    Stress MPS 10/2021 .  Findings are consistent with small area of mild ischemia involving basal portion of the antero - septal wall. The study is low risk. .  No ST deviation was noted. .  Left ventricular function is normal. Nuclear stress EF: 59 %. The left ventricular ejection fraction is normal (55-65%). End diastolic cavity size is normal. .  Prior  study not available for comparison.     Neuro/Psych PSYCHIATRIC DISORDERS Anxiety negative neurological ROS     GI/Hepatic Neg liver ROS, GERD  ,  Endo/Other  diabetesHypothyroidism Morbid obesity  Renal/GU Renal disease     Musculoskeletal  (+) Arthritis ,   Abdominal (+) + obese,   Peds  Hematology  (+) Blood dyscrasia, anemia ,   Anesthesia Other Findings   Reproductive/Obstetrics                                                           Anesthesia Evaluation  Patient identified by MRN, date of birth, ID band Patient awake    Reviewed: Allergy & Precautions, NPO status , Patient's Chart, lab work & pertinent test results, reviewed documented beta blocker date and time   Airway Mallampati: III  TM Distance: >3 FB Neck ROM: Full    Dental  (+) Teeth Intact, Dental Advisory Given   Pulmonary neg pulmonary ROS,    Pulmonary exam normal breath sounds clear to auscultation       Cardiovascular hypertension, Pt. on home beta blockers and Pt. on medications + angina + CAD, + Cardiac Stents and + DVT  Normal cardiovascular exam Rhythm:Regular Rate:Normal  Cath 3/24: Severe three-vessel disease: 100% occlusion of small distal RCA proximal to previously placed stent, diffuse 70-90% mid LAD stenosis of wraparound LAD, diffuse 65% proximal large ramus intermedius Mildly reduced EF with basal inferior hypo-/akinesis and anterolateral hypokinesis. Borderline  elevated LVEDP  Echo 06/17/19: 1. Left ventricular ejection fraction, by estimation, is 55 to 60%. The left ventricle has normal function. The left ventricle has no regional wall motion abnormalities. Left ventricular diastolic function could not be evaluated.  2. Right ventricular systolic function is normal. The right ventricular size is normal. Tricuspid regurgitation signal is inadequate for assessing PA pressure.  3. The mitral valve is degenerative. No evidence of mitral  valve regurgitation. No evidence of mitral stenosis.  4. The aortic valve is tricuspid. Aortic valve regurgitation is not visualized. Mild aortic valve stenosis.  5. There is mild (Grade II) layered plaque involving the aortic root.    Neuro/Psych negative neurological ROS  negative psych ROS   GI/Hepatic negative GI ROS, Neg liver ROS,   Endo/Other  diabetes, Type 2, Insulin DependentMorbid obesity  Renal/GU negative Renal ROS     Musculoskeletal negative musculoskeletal ROS (+)   Abdominal   Peds  Hematology negative hematology ROS (+)   Anesthesia Other Findings Day of surgery medications reviewed with the patient.  Reproductive/Obstetrics                           Anesthesia Physical Anesthesia Plan  ASA: IV  Anesthesia Plan: General   Post-op Pain Management:    Induction: Intravenous  PONV Risk Score and Plan: 3 and Treatment may vary due to age or medical condition  Airway Management Planned: Oral ETT  Additional Equipment: Arterial line, CVP, PA Cath, TEE and Ultrasound Guidance Line Placement  Intra-op Plan:   Post-operative Plan: Post-operative intubation/ventilation  Informed Consent: I have reviewed the patients History and Physical, chart, labs and discussed the procedure including the risks, benefits and alternatives for the proposed anesthesia with the patient or authorized representative who has indicated his/her understanding and acceptance.     Dental advisory given  Plan Discussed with: CRNA  Anesthesia Plan Comments:        Anesthesia Quick Evaluation  Anesthesia Physical Anesthesia Plan  ASA: 4  Anesthesia Plan: General   Post-op Pain Management: Tylenol PO (pre-op)*, Celebrex PO (pre-op)* and Ketamine IV*   Induction: Intravenous  PONV Risk Score and Plan: 4 or greater and Treatment may vary due to age or medical condition, Ondansetron, Dexamethasone and Midazolam  Airway Management Planned:  Oral ETT  Additional Equipment: Arterial line  Intra-op Plan:   Post-operative Plan: Extubation in OR  Informed Consent: I have reviewed the patients History and Physical, chart, labs and discussed the procedure including the risks, benefits and alternatives for the proposed anesthesia with the patient or authorized representative who has indicated his/her understanding and acceptance.     Dental advisory given  Plan Discussed with: CRNA  Anesthesia Plan Comments: (2 x PIV  See PAT note 11/18/2021)    Anesthesia Quick Evaluation

## 2021-11-21 NOTE — Progress Notes (Signed)
Anesthesia Chart Review   Case: [id number] Date/Time: 11/25/21 1215   Procedure: XI ROBOTIC ASSISTED INCISIONAL HERNIA REPAIR WITH MESH   Anesthesia type: General   Pre-op diagnosis: INCISIONAL HERNIA, HISTORY OF SLEEVE GASTRECTOMY   Location: [facility] ROOM 05 / [facility]   Surgeons: Stechschulte, Hyman Hopes, MD       DISCUSSION:46 y.o. never smoker with h/o HTN, poorly controlled DM II (A1C 8.6 which is improved), mild aortic valve stenosis, CAD (CABG 2021), DVT, sleep apnea, PVD, sleeve gastrectomy, incisional hernia scheduled for above procedure 11/25/2021 with Dr. Ivar Drape.   Pt seen by cardiology 10/27/2021 for preoperative evaluation.  Echo and stress test ordered at this visit.   Echo 11/15/2021 Normal left ventricle ejection fraction on echo, diastolic dysfunctions, mild aortic valve stenosis.   Stress Test 11/16/2021, per Dr. Pretty [place] Blas note, "Stress test showed a very small area of mild ischemia, which should be fine to proceed with surgery as scheduled"  Anticipate pt can proceed with planned procedure barring acute status change.  Poorly controlled DM II, evaluate DOS.  VS: BP 126/73   Pulse 76   Temp 36.8 C (Oral)   Resp 16   Ht 5' (1.524 m)   Wt 112.9 kg   SpO2 100%   BMI 48.63 kg/m   PROVIDERS: [name], Myrene Galas, MD is PCP   [name], MD is Cardiologist  LABS: Labs reviewed: Acceptable for surgery. (all labs ordered are listed, but only abnormal results are displayed)  Labs Reviewed  GLUCOSE, CAPILLARY - Abnormal; Notable for the following components:      Result Value   Glucose-Capillary 267 (*)    All other components within normal limits     IMAGES:   EKG:   CV: Stress Test 11/16/2021   Findings are consistent with small area of mild ischemia involving basal portion of the antero - septal wall. The study is low risk.   No ST deviation was noted.   Left ventricular function is normal. Nuclear stress EF: 59 %. The left ventricular ejection fraction  is normal (55-65%). End diastolic cavity size is normal.   Prior study not available for comparison.  Echo 11/15/2021 1. Left ventricular ejection fraction, by estimation, is 60 to 65%. The  left ventricle has normal function. The left ventricle has no regional  wall motion abnormalities. There is mild left ventricular hypertrophy.  Left ventricular diastolic parameters  are consistent with Grade II diastolic dysfunction (pseudonormalization).   2. Right ventricular systolic function is normal. The right ventricular  size is normal.   3. The mitral valve is normal in structure. No evidence of mitral valve  regurgitation. No evidence of mitral stenosis.   4. The aortic valve is calcified. Aortic valve regurgitation is not  visualized. Mild aortic valve stenosis. Aortic valve mean gradient  measures 10.5 mmHg.   5. The inferior vena cava is normal in size with greater than 50%  respiratory variability, suggesting right atrial pressure of 3 mmHg.  Past Medical History:  Diagnosis Date   CAD (coronary artery disease) 06/16/2019   CAD (coronary artery disease), native coronary artery 07/08/2012   Cellulitis 06/14/2020   Chest pain 06/16/2019   Diabetes mellitus (HCC) 09/23/2019   Diabetes mellitus without complication (HCC)    Dyslipidemia 09/23/2019   Encounter for long-term (current) use of insulin (HCC) 07/08/2012   Gastro-esophageal reflux disease without esophagitis 06/14/2020   Hematuria 07/08/2012   History of DVT (deep vein thrombosis)    History of kidney stones  Hyperglycemia due to type 2 diabetes mellitus (HCC) 06/14/2020   Hyperlipidemia 07/08/2012   Hypertension    Hypokalemia 06/17/2019   Hypothyroidism 06/14/2020   Idiopathic peripheral autonomic neuropathy 06/14/2020   Insulin-requiring or dependent type II diabetes mellitus (HCC)    Multiple thyroid nodules    Obesity    Old myocardial infarction 06/14/2020   Peripheral vascular disease (HCC)    Positive D  dimer    Primary generalized (osteo)arthritis 06/14/2020   S/P bariatric surgery 02/19/2014   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   Sleep apnea    no cpap   Type 2 diabetes mellitus with diabetic polyneuropathy, with long-term current use of insulin (HCC) 09/23/2019   Type 2 diabetes mellitus with hyperglycemia, with long-term current use of insulin (HCC) 09/23/2019   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 LIMA TO LAD, SVG TO PD, SVG TO RAMUS;  Surgeon: [name], MD;  Location: [facility];  Service: Open Heart Surgery;  Laterality: N/A;   LEFT HEART CATH AND CORONARY ANGIOGRAPHY N/A 06/18/2019   Procedure: LEFT HEART CATH AND CORONARY ANGIOGRAPHY;  Surgeon: [name], MD;  [facility] INVASIVE CV LAB;  Service: Cardiovascular;  Laterality: N/A;   TEE WITHOUT CARDIOVERSION N/A 06/19/2019   Procedure: TRANSESOPHAGEAL ECHOCARDIOGRAM (TEE);  Surgeon: [name], MD;  Location: [facility] OR;  Service: Open Heart Surgery;  Laterality: N/A;    MEDICATIONS:  acetaminophen (TYLENOL) 500 MG tablet   ALPRAZolam (XANAX) 0.5 MG tablet   aluminum-magnesium hydroxide-simethicone (MAALOX) 200-200-20 MG/5ML SUSP   aspirin EC 81 MG tablet   atorvastatin (LIPITOR) 80 MG tablet   carvedilol (COREG) 12.5 MG tablet   Cholecalciferol (VITAMIN D3) 125 MCG (5000 UT) CAPS   dapagliflozin propanediol (FARXIGA)  10 MG TABS tablet   Dulaglutide (TRULICITY) 1.5 MG/0.5ML SOPN   gabapentin (NEURONTIN) 300 MG capsule   ibuprofen (ADVIL) 200 MG tablet   insulin glargine (LANTUS SOLOSTAR) 100 UNIT/ML Solostar Pen   insulin lispro (HUMALOG KWIKPEN) 100 UNIT/ML KwikPen   Menaquinone-7 (VITAMIN K2 PO)   Multiple Vitamins-Calcium (ONE-A-DAY WOMENS FORMULA) TABS   nitroGLYCERIN (NITROSTAT) 0.4 MG SL tablet   olmesartan (BENICAR) 40 MG tablet   simethicone (MYLICON) 125 MG chewable tablet   No current facility-administered medications for this encounter.     Jodell Cipro Ward, PA-C [facility] [phone]

## 2021-11-25 ENCOUNTER — Encounter (HOSPITAL_COMMUNITY)
Admission: RE | Disposition: A | Discharge status: Home / Self Care | Payer: Self-pay | Source: Home / Self Care | Attending: Surgery

## 2021-11-25 ENCOUNTER — Ambulatory Visit (HOSPITAL_COMMUNITY): Payer: Commercial Managed Care - HMO | Admitting: Physician Assistant

## 2021-11-25 ENCOUNTER — Observation Stay (HOSPITAL_COMMUNITY)
Admission: RE | Admit: 2021-11-25 | Discharge: 2021-11-27 | Disposition: A | Discharge status: Home / Self Care | Payer: Commercial Managed Care - HMO | Attending: Surgery | Admitting: Surgery

## 2021-11-25 ENCOUNTER — Ambulatory Visit (HOSPITAL_BASED_OUTPATIENT_CLINIC_OR_DEPARTMENT_OTHER): Payer: Commercial Managed Care - HMO | Admitting: Certified Registered Nurse Anesthetist

## 2021-11-25 ENCOUNTER — Encounter (HOSPITAL_COMMUNITY): Payer: Self-pay | Admitting: Surgery

## 2021-11-25 ENCOUNTER — Other Ambulatory Visit: Payer: Self-pay

## 2021-11-25 DIAGNOSIS — E039 Hypothyroidism, unspecified: Secondary | ICD-10-CM | POA: Insufficient documentation

## 2021-11-25 DIAGNOSIS — Z86718 Personal history of other venous thrombosis and embolism: Secondary | ICD-10-CM | POA: Insufficient documentation

## 2021-11-25 DIAGNOSIS — Z79899 Other long term (current) drug therapy: Secondary | ICD-10-CM | POA: Diagnosis not present

## 2021-11-25 DIAGNOSIS — E1142 Type 2 diabetes mellitus with diabetic polyneuropathy: Secondary | ICD-10-CM | POA: Insufficient documentation

## 2021-11-25 DIAGNOSIS — Z951 Presence of aortocoronary bypass graft: Secondary | ICD-10-CM | POA: Insufficient documentation

## 2021-11-25 DIAGNOSIS — I252 Old myocardial infarction: Secondary | ICD-10-CM

## 2021-11-25 DIAGNOSIS — I25110 Atherosclerotic heart disease of native coronary artery with unstable angina pectoris: Secondary | ICD-10-CM | POA: Insufficient documentation

## 2021-11-25 DIAGNOSIS — K432 Incisional hernia without obstruction or gangrene: Secondary | ICD-10-CM | POA: Diagnosis present

## 2021-11-25 DIAGNOSIS — I25119 Atherosclerotic heart disease of native coronary artery with unspecified angina pectoris: Secondary | ICD-10-CM | POA: Diagnosis not present

## 2021-11-25 DIAGNOSIS — K430 Incisional hernia with obstruction, without gangrene: Secondary | ICD-10-CM | POA: Diagnosis present

## 2021-11-25 DIAGNOSIS — Z8719 Personal history of other diseases of the digestive system: Principal | ICD-10-CM

## 2021-11-25 DIAGNOSIS — Z01818 Encounter for other preprocedural examination: Secondary | ICD-10-CM

## 2021-11-25 DIAGNOSIS — I10 Essential (primary) hypertension: Secondary | ICD-10-CM | POA: Diagnosis not present

## 2021-11-25 DIAGNOSIS — Z7982 Long term (current) use of aspirin: Secondary | ICD-10-CM | POA: Diagnosis not present

## 2021-11-25 DIAGNOSIS — Z794 Long term (current) use of insulin: Secondary | ICD-10-CM | POA: Insufficient documentation

## 2021-11-25 DIAGNOSIS — Z7985 Long-term (current) use of injectable non-insulin antidiabetic drugs: Secondary | ICD-10-CM | POA: Diagnosis not present

## 2021-11-25 HISTORY — PX: XI ROBOTIC ASSISTED VENTRAL HERNIA: SHX6789

## 2021-11-25 LAB — GLUCOSE, CAPILLARY
Glucose-Capillary: 192 mg/dL — ABNORMAL HIGH (ref 70–99)
Glucose-Capillary: 149 mg/dL — ABNORMAL HIGH (ref 70–99)
Glucose-Capillary: 169 mg/dL — ABNORMAL HIGH (ref 70–99)
Glucose-Capillary: 254 mg/dL — ABNORMAL HIGH (ref 70–99)
Glucose-Capillary: 266 mg/dL — ABNORMAL HIGH (ref 70–99)

## 2021-11-25 LAB — POCT PREGNANCY, URINE: Preg Test, Ur: NEGATIVE

## 2021-11-25 SURGERY — REPAIR, HERNIA, VENTRAL, ROBOT-ASSISTED
Anesthesia: General | Site: Abdomen

## 2021-11-25 MED ORDER — KETAMINE HCL 10 MG/ML IJ SOLN
INTRAMUSCULAR | Status: AC
  Filled 2021-11-25: qty 1

## 2021-11-25 MED ORDER — ONDANSETRON HCL 4 MG/2ML IJ SOLN
INTRAMUSCULAR | Status: AC
Start: 2021-11-25 — End: ?
  Filled 2021-11-25: qty 2

## 2021-11-25 MED ORDER — GABAPENTIN 300 MG PO CAPS
300.0000 mg | ORAL_CAPSULE | Freq: Three times a day (TID) | ORAL | Status: DC
  Administered 2021-11-26 – 2021-11-27 (×4): 300 mg via ORAL
  Filled 2021-11-25 (×4): qty 1

## 2021-11-25 MED ORDER — BUPIVACAINE LIPOSOME 1.3 % IJ SUSP
INTRAMUSCULAR | Status: AC
  Filled 2021-11-25: qty 20

## 2021-11-25 MED ORDER — PHENYLEPHRINE 80 MCG/ML (10ML) SYRINGE FOR IV PUSH (FOR BLOOD PRESSURE SUPPORT)
PREFILLED_SYRINGE | INTRAVENOUS | Status: DC | PRN
  Administered 2021-11-25 (×2): 40 ug via INTRAVENOUS
  Administered 2021-11-25 (×2): 160 ug via INTRAVENOUS
  Administered 2021-11-25: 40 ug via INTRAVENOUS

## 2021-11-25 MED ORDER — FENTANYL CITRATE (PF) 100 MCG/2ML IJ SOLN
INTRAMUSCULAR | Status: DC | PRN
  Administered 2021-11-25 (×3): 25 ug via INTRAVENOUS
  Administered 2021-11-25: 50 ug via INTRAVENOUS
  Administered 2021-11-25: 25 ug via INTRAVENOUS

## 2021-11-25 MED ORDER — ROCURONIUM BROMIDE 10 MG/ML (PF) SYRINGE
PREFILLED_SYRINGE | INTRAVENOUS | Status: DC | PRN
  Administered 2021-11-25: 30 mg via INTRAVENOUS
  Administered 2021-11-25 (×2): 50 mg via INTRAVENOUS
  Administered 2021-11-25: 40 mg via INTRAVENOUS
  Administered 2021-11-25 (×2): 30 mg via INTRAVENOUS
  Administered 2021-11-25: 70 mg via INTRAVENOUS

## 2021-11-25 MED ORDER — LIDOCAINE 2% (20 MG/ML) 5 ML SYRINGE
INTRAMUSCULAR | Status: DC | PRN
  Administered 2021-11-25: 60 mg via INTRAVENOUS

## 2021-11-25 MED ORDER — OXYCODONE HCL 5 MG PO TABS
10.0000 mg | ORAL_TABLET | ORAL | Status: DC | PRN
  Administered 2021-11-26 – 2021-11-27 (×3): 10 mg via ORAL
  Filled 2021-11-25 (×3): qty 2

## 2021-11-25 MED ORDER — ORAL CARE MOUTH RINSE: 15.0000 mL | Freq: Once | OROMUCOSAL | Status: AC

## 2021-11-25 MED ORDER — PROMETHAZINE HCL 25 MG/ML IJ SOLN: 6.2500 mg | INTRAMUSCULAR | Status: DC | PRN

## 2021-11-25 MED ORDER — FENTANYL CITRATE (PF) 250 MCG/5ML IJ SOLN
INTRAMUSCULAR | Status: DC | PRN
  Administered 2021-11-25: 50 ug via INTRAVENOUS
  Administered 2021-11-25: 100 ug via INTRAVENOUS
  Administered 2021-11-25 (×2): 50 ug via INTRAVENOUS
  Administered 2021-11-25: 100 ug via INTRAVENOUS

## 2021-11-25 MED ORDER — PHENYLEPHRINE HCL (PRESSORS) 10 MG/ML IV SOLN
INTRAVENOUS | Status: AC
  Filled 2021-11-25: qty 1

## 2021-11-25 MED ORDER — INSULIN ASPART 100 UNIT/ML IJ SOLN
8.0000 [IU] | Freq: Once | INTRAMUSCULAR | Status: AC
Start: 2021-11-25 — End: 2021-11-25
  Administered 2021-11-25: 8 [IU] via SUBCUTANEOUS

## 2021-11-25 MED ORDER — LACTATED RINGERS IV SOLN: INTRAVENOUS | Status: DC | PRN

## 2021-11-25 MED ORDER — PHENOL 1.4 % MT LIQD: 1.0000 | OROMUCOSAL | Status: DC | PRN

## 2021-11-25 MED ORDER — LACTATED RINGERS IR SOLN
Status: DC | PRN
  Administered 2021-11-25: 1000 mL

## 2021-11-25 MED ORDER — ATORVASTATIN CALCIUM 40 MG PO TABS
80.0000 mg | ORAL_TABLET | Freq: Every day | ORAL | Status: DC
  Administered 2021-11-26 – 2021-11-27 (×2): 80 mg via ORAL
  Filled 2021-11-25 (×2): qty 2

## 2021-11-25 MED ORDER — ACETAMINOPHEN 500 MG PO TABS
1000.0000 mg | ORAL_TABLET | ORAL | Status: AC
  Administered 2021-11-25: 1000 mg via ORAL
  Filled 2021-11-25: qty 2

## 2021-11-25 MED ORDER — DOCUSATE SODIUM 100 MG PO CAPS
100.0000 mg | ORAL_CAPSULE | Freq: Two times a day (BID) | ORAL | Status: DC
  Administered 2021-11-26 – 2021-11-27 (×3): 100 mg via ORAL
  Filled 2021-11-25 (×3): qty 1

## 2021-11-25 MED ORDER — HYDROMORPHONE HCL 1 MG/ML IJ SOLN
1.0000 mg | INTRAMUSCULAR | Status: DC | PRN
  Administered 2021-11-26: 1 mg via INTRAVENOUS
  Filled 2021-11-25: qty 1

## 2021-11-25 MED ORDER — CARVEDILOL 12.5 MG PO TABS
12.5000 mg | ORAL_TABLET | Freq: Every morning | ORAL | Status: DC
  Administered 2021-11-26 – 2021-11-27 (×2): 12.5 mg via ORAL
  Filled 2021-11-25 (×2): qty 1

## 2021-11-25 MED ORDER — LIDOCAINE 20MG/ML (2%) 15 ML SYRINGE OPTIME
INTRAMUSCULAR | Status: DC | PRN
  Administered 2021-11-25: 1.5 mg/kg/h via INTRAVENOUS

## 2021-11-25 MED ORDER — MIDAZOLAM HCL 2 MG/2ML IJ SOLN
INTRAMUSCULAR | Status: AC
  Filled 2021-11-25: qty 2

## 2021-11-25 MED ORDER — BUPIVACAINE-EPINEPHRINE 0.25% -1:200000 IJ SOLN: INTRAMUSCULAR | Status: DC | PRN

## 2021-11-25 MED ORDER — PROCHLORPERAZINE EDISYLATE 10 MG/2ML IJ SOLN: 10.0000 mg | INTRAMUSCULAR | Status: DC | PRN

## 2021-11-25 MED ORDER — DEXAMETHASONE SODIUM PHOSPHATE 10 MG/ML IJ SOLN
INTRAMUSCULAR | Status: AC
  Filled 2021-11-25: qty 1

## 2021-11-25 MED ORDER — LIDOCAINE HCL (PF) 2 % IJ SOLN
INTRAMUSCULAR | Status: AC
  Filled 2021-11-25: qty 10

## 2021-11-25 MED ORDER — PROCHLORPERAZINE EDISYLATE 10 MG/2ML IJ SOLN: 5.0000 mg | Freq: Four times a day (QID) | INTRAMUSCULAR | Status: DC | PRN

## 2021-11-25 MED ORDER — CELECOXIB 200 MG PO CAPS
400.0000 mg | ORAL_CAPSULE | ORAL | Status: AC
  Administered 2021-11-25: 400 mg via ORAL
  Filled 2021-11-25: qty 2

## 2021-11-25 MED ORDER — FENTANYL CITRATE PF 50 MCG/ML IJ SOSY
PREFILLED_SYRINGE | INTRAMUSCULAR | Status: AC
  Filled 2021-11-25: qty 1

## 2021-11-25 MED ORDER — SUGAMMADEX SODIUM 500 MG/5ML IV SOLN
INTRAVENOUS | Status: DC | PRN
  Administered 2021-11-25: 500 mg via INTRAVENOUS

## 2021-11-25 MED ORDER — PROPOFOL 10 MG/ML IV BOLUS
INTRAVENOUS | Status: AC
  Filled 2021-11-25: qty 20

## 2021-11-25 MED ORDER — SIMETHICONE 80 MG PO CHEW: 125.0000 mg | CHEWABLE_TABLET | Freq: Four times a day (QID) | ORAL | Status: DC | PRN

## 2021-11-25 MED ORDER — ONDANSETRON HCL 4 MG/2ML IJ SOLN: 4.0000 mg | Freq: Four times a day (QID) | INTRAMUSCULAR | Status: DC | PRN

## 2021-11-25 MED ORDER — LIDOCAINE HCL (PF) 2 % IJ SOLN
INTRAMUSCULAR | Status: AC
  Filled 2021-11-25: qty 5

## 2021-11-25 MED ORDER — PHENYLEPHRINE HCL-NACL 20-0.9 MG/250ML-% IV SOLN
INTRAVENOUS | Status: DC | PRN
  Administered 2021-11-25: 50 ug/min via INTRAVENOUS

## 2021-11-25 MED ORDER — LACTATED RINGERS IV SOLN: INTRAVENOUS | Status: DC

## 2021-11-25 MED ORDER — CEFAZOLIN SODIUM-DEXTROSE 2-4 GM/100ML-% IV SOLN
2.0000 g | INTRAVENOUS | Status: AC
  Administered 2021-11-25 (×2): 2 g via INTRAVENOUS
  Filled 2021-11-25: qty 100

## 2021-11-25 MED ORDER — OXYCODONE HCL 5 MG PO TABS
5.0000 mg | ORAL_TABLET | ORAL | Status: DC | PRN
  Administered 2021-11-26 (×2): 5 mg via ORAL
  Filled 2021-11-25 (×2): qty 1

## 2021-11-25 MED ORDER — CHLORHEXIDINE GLUCONATE CLOTH 2 % EX PADS: 6.0000 | MEDICATED_PAD | Freq: Once | CUTANEOUS | Status: DC

## 2021-11-25 MED ORDER — MEPERIDINE HCL 50 MG/ML IJ SOLN: 6.2500 mg | INTRAMUSCULAR | Status: DC | PRN

## 2021-11-25 MED ORDER — HYDROMORPHONE HCL 1 MG/ML IJ SOLN
INTRAMUSCULAR | Status: AC
  Filled 2021-11-25: qty 1

## 2021-11-25 MED ORDER — STERILE WATER FOR IRRIGATION IR SOLN
Status: DC | PRN
  Administered 2021-11-25: 1000 mL

## 2021-11-25 MED ORDER — GABAPENTIN 300 MG PO CAPS
300.0000 mg | ORAL_CAPSULE | ORAL | Status: AC
  Administered 2021-11-25: 300 mg via ORAL
  Filled 2021-11-25: qty 1

## 2021-11-25 MED ORDER — LIDOCAINE HCL 2 % IJ SOLN
INTRAMUSCULAR | Status: AC
  Filled 2021-11-25: qty 20

## 2021-11-25 MED ORDER — SIMETHICONE 80 MG PO CHEW: 40.0000 mg | CHEWABLE_TABLET | Freq: Four times a day (QID) | ORAL | Status: DC | PRN

## 2021-11-25 MED ORDER — HYDRALAZINE HCL 20 MG/ML IJ SOLN
10.0000 mg | Freq: Four times a day (QID) | INTRAMUSCULAR | Status: DC | PRN
Start: 2021-11-25 — End: 2021-11-27

## 2021-11-25 MED ORDER — ALPRAZOLAM 0.5 MG PO TABS: 0.5000 mg | ORAL_TABLET | Freq: Two times a day (BID) | ORAL | Status: DC | PRN

## 2021-11-25 MED ORDER — BUPIVACAINE LIPOSOME 1.3 % IJ SUSP: INTRAMUSCULAR | Status: DC | PRN

## 2021-11-25 MED ORDER — ROCURONIUM BROMIDE 10 MG/ML (PF) SYRINGE
PREFILLED_SYRINGE | INTRAVENOUS | Status: AC
  Filled 2021-11-25: qty 10

## 2021-11-25 MED ORDER — IRBESARTAN 150 MG PO TABS
300.0000 mg | ORAL_TABLET | Freq: Every day | ORAL | Status: DC
  Administered 2021-11-26 – 2021-11-27 (×2): 300 mg via ORAL
  Filled 2021-11-25: qty 2
  Filled 2021-11-25: qty 1

## 2021-11-25 MED ORDER — BUPIVACAINE-EPINEPHRINE (PF) 0.25% -1:200000 IJ SOLN
INTRAMUSCULAR | Status: AC
  Filled 2021-11-25: qty 30

## 2021-11-25 MED ORDER — FENTANYL CITRATE (PF) 100 MCG/2ML IJ SOLN
INTRAMUSCULAR | Status: AC
  Filled 2021-11-25: qty 2

## 2021-11-25 MED ORDER — FENTANYL CITRATE (PF) 250 MCG/5ML IJ SOLN
INTRAMUSCULAR | Status: AC
  Filled 2021-11-25: qty 5

## 2021-11-25 MED ORDER — CEFAZOLIN SODIUM-DEXTROSE 2-4 GM/100ML-% IV SOLN
INTRAVENOUS | Status: AC
  Filled 2021-11-25: qty 100

## 2021-11-25 MED ORDER — ONDANSETRON HCL 4 MG/2ML IJ SOLN
INTRAMUSCULAR | Status: DC | PRN
  Administered 2021-11-25: 4 mg via INTRAVENOUS

## 2021-11-25 MED ORDER — PROPOFOL 10 MG/ML IV BOLUS
INTRAVENOUS | Status: DC | PRN
  Administered 2021-11-25: 130 mg via INTRAVENOUS

## 2021-11-25 MED ORDER — HYDROMORPHONE HCL 1 MG/ML IJ SOLN
0.2500 mg | INTRAMUSCULAR | Status: DC | PRN
  Administered 2021-11-25 (×2): 0.25 mg via INTRAVENOUS

## 2021-11-25 MED ORDER — MIDAZOLAM HCL 2 MG/2ML IJ SOLN
INTRAMUSCULAR | Status: DC | PRN
  Administered 2021-11-25: 2 mg via INTRAVENOUS

## 2021-11-25 MED ORDER — ONDANSETRON 4 MG PO TBDP: 4.0000 mg | ORAL_TABLET | Freq: Four times a day (QID) | ORAL | Status: DC | PRN

## 2021-11-25 MED ORDER — ENOXAPARIN SODIUM 40 MG/0.4ML IJ SOSY
40.0000 mg | PREFILLED_SYRINGE | INTRAMUSCULAR | Status: DC
  Administered 2021-11-26: 40 mg via SUBCUTANEOUS
  Filled 2021-11-25: qty 0.4

## 2021-11-25 MED ORDER — METHOCARBAMOL 1000 MG/10ML IJ SOLN: 500.0000 mg | Freq: Four times a day (QID) | INTRAVENOUS | Status: DC | PRN

## 2021-11-25 MED ORDER — BUPIVACAINE LIPOSOME 1.3 % IJ SUSP: 20.0000 mL | Freq: Once | INTRAMUSCULAR | Status: DC

## 2021-11-25 MED ORDER — SUGAMMADEX SODIUM 500 MG/5ML IV SOLN
INTRAVENOUS | Status: AC
  Filled 2021-11-25: qty 5

## 2021-11-25 MED ORDER — INSULIN ASPART 100 UNIT/ML IJ SOLN
INTRAMUSCULAR | Status: AC
  Filled 2021-11-25: qty 1

## 2021-11-25 MED ORDER — ACETAMINOPHEN 325 MG PO TABS
650.0000 mg | ORAL_TABLET | Freq: Four times a day (QID) | ORAL | Status: DC
  Administered 2021-11-26 – 2021-11-27 (×6): 650 mg via ORAL
  Filled 2021-11-25 (×6): qty 2

## 2021-11-25 MED ORDER — DEXAMETHASONE SODIUM PHOSPHATE 10 MG/ML IJ SOLN
INTRAMUSCULAR | Status: DC | PRN
  Administered 2021-11-25: 5 mg via INTRAVENOUS

## 2021-11-25 MED ORDER — ASPIRIN 81 MG PO TBEC
81.0000 mg | DELAYED_RELEASE_TABLET | Freq: Every day | ORAL | Status: DC
  Administered 2021-11-26 – 2021-11-27 (×2): 81 mg via ORAL
  Filled 2021-11-25 (×2): qty 1

## 2021-11-25 MED ORDER — PROCHLORPERAZINE MALEATE 10 MG PO TABS: 10.0000 mg | ORAL_TABLET | Freq: Four times a day (QID) | ORAL | Status: DC | PRN

## 2021-11-25 MED ORDER — SUCCINYLCHOLINE CHLORIDE 200 MG/10ML IV SOSY
PREFILLED_SYRINGE | INTRAVENOUS | Status: AC
  Filled 2021-11-25: qty 10

## 2021-11-25 MED ORDER — CHLORHEXIDINE GLUCONATE 0.12 % MT SOLN
15.0000 mL | Freq: Once | OROMUCOSAL | Status: AC
  Administered 2021-11-25: 15 mL via OROMUCOSAL

## 2021-11-25 MED ORDER — KETAMINE HCL 10 MG/ML IJ SOLN
INTRAMUSCULAR | Status: DC | PRN
  Administered 2021-11-25: 25 mg via INTRAVENOUS

## 2021-11-25 MED ORDER — PHENYLEPHRINE 80 MCG/ML (10ML) SYRINGE FOR IV PUSH (FOR BLOOD PRESSURE SUPPORT)
PREFILLED_SYRINGE | INTRAVENOUS | Status: AC
  Filled 2021-11-25: qty 20

## 2021-11-25 MED ORDER — INSULIN ASPART 100 UNIT/ML IJ SOLN
0.0000 [IU] | INTRAMUSCULAR | Status: DC
  Administered 2021-11-25: 11 [IU] via SUBCUTANEOUS
  Administered 2021-11-26: 15 [IU] via SUBCUTANEOUS
  Administered 2021-11-26: 4 [IU] via SUBCUTANEOUS
  Administered 2021-11-26: 7 [IU] via SUBCUTANEOUS
  Administered 2021-11-26: 4 [IU] via SUBCUTANEOUS
  Administered 2021-11-26: 15 [IU] via SUBCUTANEOUS
  Administered 2021-11-27: 4 [IU] via SUBCUTANEOUS
  Administered 2021-11-27 (×2): 7 [IU] via SUBCUTANEOUS

## 2021-11-25 MED ORDER — ALUM & MAG HYDROXIDE-SIMETH 200-200-20 MG/5ML PO SUSP: 30.0000 mL | Freq: Three times a day (TID) | ORAL | Status: DC | PRN

## 2021-11-25 SURGICAL SUPPLY — 75 items
ADH SKN CLS APL DERMABOND .7 (GAUZE/BANDAGES/DRESSINGS)
APL PRP STRL LF DISP 70% ISPRP (MISCELLANEOUS) ×2
BAG COUNTER SPONGE SURGICOUNT (BAG) ×2 IMPLANT
BAG SPNG CNTER NS LX DISP (BAG) ×1
BLADE SURG SZ11 CARB STEEL (BLADE) ×2 IMPLANT
CHLORAPREP W/TINT 26 (MISCELLANEOUS) ×2 IMPLANT
COVER MAYO STAND STRL (DRAPES) ×2 IMPLANT
COVER TIP SHEARS 8 DVNC (MISCELLANEOUS) ×2 IMPLANT
COVER TIP SHEARS 8MM DA VINCI (MISCELLANEOUS) ×1
DERMABOND IMPLANT
DERMABOND ADVANCED (GAUZE/BANDAGES/DRESSINGS)
DERMABOND ADVANCED .7 DNX12 (GAUZE/BANDAGES/DRESSINGS) IMPLANT
DEVICE TROCAR PUNCTURE CLOSURE (ENDOMECHANICALS) IMPLANT
DRAPE ARM DVNC X/XI (DISPOSABLE) ×6 IMPLANT
DRAPE COLUMN DVNC XI (DISPOSABLE) ×2 IMPLANT
DRAPE DA VINCI XI ARM (DISPOSABLE) ×4
DRAPE DA VINCI XI COLUMN (DISPOSABLE) ×1
ELECT L-HOOK LAP 45CM DISP (ELECTROSURGICAL) ×1
ELECT PENCIL ROCKER SW 15FT (MISCELLANEOUS) ×2 IMPLANT
ELECT REM PT RETURN 15FT ADLT (MISCELLANEOUS) ×2 IMPLANT
ELECTRODE L-HOOK LAP 45CM DISP (ELECTROSURGICAL) ×2 IMPLANT
GLOVE BIO SURGEON STRL SZ7.5 (GLOVE) ×4 IMPLANT
GLOVE BIOGEL PI IND STRL 8 (GLOVE) ×4 IMPLANT
GLOVE BIOGEL PI INDICATOR 8 (GLOVE) ×4
GOWN STRL REUS W/ TWL XL LVL3 (GOWN DISPOSABLE) ×6 IMPLANT
GOWN STRL REUS W/TWL XL LVL3 (GOWN DISPOSABLE) ×4
GRASPER SUT TROCAR 14GX15 (MISCELLANEOUS) IMPLANT
IRRIG SUCT STRYKERFLOW 2 WTIP (MISCELLANEOUS)
IRRIGATION SUCT STRKRFLW 2 WTP (MISCELLANEOUS) IMPLANT
KIT BASIN OR (CUSTOM PROCEDURE TRAY) ×2 IMPLANT
KIT TURNOVER KIT A (KITS) IMPLANT
MANIFOLD NEPTUNE II (INSTRUMENTS) ×2 IMPLANT
MARKER SKIN DUAL TIP RULER LAB (MISCELLANEOUS) IMPLANT
MESH SOFT 12X12IN BARD (Mesh General) IMPLANT
NDL SPNL 18GX3.5 QUINCKE PK (NEEDLE) ×2 IMPLANT
NEEDLE SPNL 18GX3.5 QUINCKE PK (NEEDLE) ×1 IMPLANT
OBTURATOR OPTICAL LONG 8 DVNC (TROCAR) IMPLANT
OBTURATOR OPTICAL LONG 8MM (TROCAR) ×1
PACK CARDIOVASCULAR III (CUSTOM PROCEDURE TRAY) ×2 IMPLANT
SEAL CANN UNIV 5-8 DVNC XI (MISCELLANEOUS) ×6 IMPLANT
SEAL XI 5MM-8MM UNIVERSAL (MISCELLANEOUS) ×8
SEALER VESSEL DA VINCI XI (MISCELLANEOUS)
SEALER VESSEL EXT DVNC XI (MISCELLANEOUS) IMPLANT
SET TUBE SMOKE EVAC HIGH FLOW (TUBING) ×2 IMPLANT
SOL ANTI FOG 6CC (MISCELLANEOUS) ×2 IMPLANT
SOLUTION ANTI FOG 6CC (MISCELLANEOUS) ×1
SOLUTION ELECTROLUBE (MISCELLANEOUS) ×2 IMPLANT
SPIKE FLUID TRANSFER (MISCELLANEOUS) ×2 IMPLANT
SUT MNCRL AB 4-0 PS2 18 (SUTURE) ×2 IMPLANT
SUT STRAFIX PDS 18 CTX (SUTURE) IMPLANT
SUT STRAFIX SPIRAL 2-0 3 (SUTURE) IMPLANT
SUT STRAFIX SPIRAL 2-0 5 (SUTURE) IMPLANT
SUT STRAFIX SPIRAL 2-0 9 (SUTURE) IMPLANT
SUT STRAFIX SYMMETRIC 0-0 12 (SUTURE) ×1
SUT STRAFIX SYMMETRIC 0-0 18 (SUTURE)
SUT STRAFIX SYMMETRIC 0-0 24 (SUTURE)
SUT STRAFIX SYMMETRIC 1-0 12 (SUTURE)
SUT STRAFIX SYMMETRIC 1-0 24 (SUTURE)
SUT VIC AB 2-0 SH 18 (SUTURE) IMPLANT
SUT VIC AB 2-0 SH 27 (SUTURE) ×1
SUT VIC AB 2-0 SH 27X BRD (SUTURE) IMPLANT
SUTURE STRAFIX SYMMETRC 0-0 12 (SUTURE) IMPLANT
SUTURE STRAFIX SYMMETRC 0-0 18 (SUTURE) IMPLANT
SUTURE STRAFIX SYMMETRC 0-0 24 (SUTURE) IMPLANT
SUTURE STRAFIX SYMMETRC 1-0 12 (SUTURE) IMPLANT
SUTURE STRAFIX SYMMETRC 1-0 24 (SUTURE) IMPLANT
SYR 20ML LL LF (SYRINGE) ×2 IMPLANT
TAPE STRIPS DRAPE STRL (GAUZE/BANDAGES/DRESSINGS) ×2 IMPLANT
TOWEL OR 17X26 10 PK STRL BLUE (TOWEL DISPOSABLE) ×2 IMPLANT
TOWEL OR NON WOVEN STRL DISP B (DISPOSABLE) IMPLANT
TRAY FOLEY MTR SLVR 14FR STAT (SET/KITS/TRAYS/PACK) IMPLANT
TROCAR ADV FIXATION 12X100MM (TROCAR) ×2 IMPLANT
TROCAR Z-THREAD FIOS 5X100MM (TROCAR) ×2 IMPLANT
TROCAR Z-THREAD OPTICAL 5X100M (TROCAR) IMPLANT
TUBING INSUFFLATION 10FT LAP (TUBING) ×2 IMPLANT

## 2021-11-25 NOTE — Progress Notes (Signed)
eLink Physician-Brief Progress Note Patient Name: Beverly [name] DOB: 1976-03-04 MRN: [id number]   Date of Service  11/25/2021  HPI/Events of Note  19F with morbid obesity, hx post-op DVT (2021), DM2, s/p CABG, OSA no CPAP admitted for elective incarcerated incisional hernia s/p repair on 9/1. CCS contacted CCM for elink coverage to monitor post-op  Patient AAOx4. SpO2 97% on 4L. SBP 170s   eICU Interventions  AM labs in place No critical care needs at this time Post-op care, diet, DVT ppx and pain control per primary team PRN hydralazine for HTN ordered Elink available as needed     Intervention Category Evaluation Type: New Patient Evaluation  [name] [name] 11/25/2021, 11:31 PM

## 2021-11-25 NOTE — H&P (Signed)
Admitting Physician: Hyman Hopes Ra Pfiester  Service: General surgery  CC: Hernia  Subjective   HPI: Beverly [name] is an 46 y.o. female who is here for hernia repair  Past Medical History:  Diagnosis Date   CAD (coronary artery disease) 06/16/2019   CAD (coronary artery disease), native coronary artery 07/08/2012   Cellulitis 06/14/2020   Chest pain 06/16/2019   Diabetes mellitus (HCC) 09/23/2019   Diabetes mellitus without complication (HCC)    Dyslipidemia 09/23/2019   Encounter for long-term (current) use of insulin (HCC) 07/08/2012   Gastro-esophageal reflux disease without esophagitis 06/14/2020   Hematuria 07/08/2012   History of DVT (deep vein thrombosis)    History of kidney stones    Hyperglycemia due to type 2 diabetes mellitus (HCC) 06/14/2020   Hyperlipidemia 07/08/2012   Hypertension    Hypokalemia 06/17/2019   Hypothyroidism 06/14/2020   Idiopathic peripheral autonomic neuropathy 06/14/2020   Insulin-requiring or dependent type II diabetes mellitus (HCC)    Multiple thyroid nodules    Obesity    Old myocardial infarction 06/14/2020   Peripheral vascular disease (HCC)    Positive D dimer    Primary generalized (osteo)arthritis 06/14/2020   S/P bariatric surgery 02/19/2014   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   Sleep apnea    no cpap   Type 2 diabetes mellitus with diabetic polyneuropathy, with long-term current use of insulin (HCC) 09/23/2019   Type 2 diabetes mellitus with hyperglycemia, with long-term current use of insulin (HCC) 09/23/2019   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 LIMA TO LAD, SVG TO PD, SVG TO RAMUS;  Surgeon: [name], MD;  Location: [facility];  Service: Open Heart Surgery;  Laterality: N/A;   LEFT HEART CATH AND CORONARY ANGIOGRAPHY N/A 06/18/2019   Procedure: LEFT HEART CATH AND CORONARY ANGIOGRAPHY;  Surgeon: [name], MD;  Location: [facility] INVASIVE CV LAB;  Service: Cardiovascular;  Laterality: N/A;   TEE WITHOUT CARDIOVERSION N/A 06/19/2019   Procedure: TRANSESOPHAGEAL ECHOCARDIOGRAM (TEE);  Surgeon: [name], MD;  Location: [facility] OR;  Service: Open Heart Surgery;  Laterality: N/A;    Family History  Problem Relation Age of Onset   Hypertension Mother    Diabetes Mellitus II Mother    Hypertension Father    Diabetes Mellitus II Father    Heart disease Father        ?pacemaker vs loop recorder?   Heart disease Maternal Grandmother        CABGx 4    Social:  reports that she has never smoked. She has never used smokeless tobacco. She reports that she does not drink alcohol and does not use drugs.  Allergies:  Allergies  Allergen Reactions   Shellfish Allergy Diarrhea   Shellfish-Derived Products Diarrhea   Tape Other (See Comments)    Can blister the skin    Medications: Current Outpatient Medications  Medication Instructions  acetaminophen (TYLENOL) 1,000 mg, Oral, Every 8 hours PRN   ALPRAZolam (XANAX) 0.5 mg, Oral, 2 times daily PRN   aluminum-magnesium hydroxide-simethicone (MAALOX) 200-200-20 MG/5ML SUSP 30 mLs, Oral, 3 times daily PRN   aspirin EC 81 mg, Oral, Daily   atorvastatin (LIPITOR) 80 MG tablet 1 TABLET BY MOUTH DAILY AT 6 PM   carvedilol (COREG) 12.5 mg, Oral, Every morning   dapagliflozin propanediol (FARXIGA) 10 mg, Oral, Daily   gabapentin (NEURONTIN) 300 mg, Oral, 3 times  daily   ibuprofen (ADVIL) 400 mg, Oral, Every 8 hours PRN   insulin lispro (HUMALOG KWIKPEN) 12 Units, Subcutaneous, 3 times daily   Lantus SoloStar 50 Units, Subcutaneous, Daily   Menaquinone-7 (VITAMIN K2 PO) 1 tablet, Oral, Daily   Multiple Vitamins-Calcium (ONE-A-DAY WOMENS FORMULA) TABS 1 tablet, Oral, Daily with breakfast, Unknown strength   nitroGLYCERIN (NITROSTAT) 0.4 mg, Sublingual, Every 5 min PRN   olmesartan (BENICAR) 40 mg, Oral, Daily   simethicone (MYLICON) 125 mg, Oral, Every 6 hours PRN   Trulicity 1.5 mg, Subcutaneous, Every Tue   Vitamin D3 5,000 Units, Oral, Daily    ROS - all of the below systems have been reviewed with the patient and positives are indicated with bold text General: chills, fever or night sweats Eyes: blurry vision or double vision ENT: epistaxis or sore throat Allergy/Immunology: itchy/watery eyes or nasal congestion Hematologic/Lymphatic: bleeding problems, blood clots or swollen lymph nodes Endocrine: temperature intolerance or unexpected weight changes Breast: new or changing breast lumps or nipple discharge Resp: cough, shortness of breath, or wheezing CV: chest pain or dyspnea on exertion GI: as per HPI GU: dysuria, trouble voiding, or hematuria MSK: joint pain or joint stiffness Neuro: TIA or stroke symptoms Derm: pruritus and skin lesion changes Psych: anxiety and depression  Objective   PE Blood pressure (!) 142/56, pulse 79, temperature 97.7 F (36.5 C), temperature source Oral, height 5' (1.524 m), weight 114.8 kg, SpO2 99 %. Constitutional: NAD; conversant; no deformities Eyes: Moist conjunctiva; no lid lag; anicteric; PERRL Neck: Trachea midline; no thyromegaly Lungs: Normal respiratory effort; no tactile fremitus CV: RRR; no palpable thrills; no pitting edema GI: Abd Obese, tender along upper midline incision; no palpable hepatosplenomegaly MSK: Normal range of motion of extremities; no clubbing/cyanosis Psychiatric:  Appropriate affect; alert and oriented x3 Lymphatic: No palpable cervical or axillary lymphadenopathy  Results for orders placed or performed during the hospital encounter of 11/25/21 (from the past 24 hour(s))  Glucose, capillary     Status: Abnormal   Collection Time: 11/25/21 11:00 AM  Result Value Ref Range   Glucose-Capillary 192 (H) 70 - 99 mg/dL   Comment 1 Notify RN    Comment 2 Document in Chart   Pregnancy, urine POC     Status: None   Collection Time: 11/25/21 11:28 AM  Result Value Ref Range   Preg Test, Ur NEGATIVE NEGATIVE    Imaging Orders  No imaging studies ordered today   CT Abd/Pel 07/30/21  1. No acute findings in the abdomen or pelvis, including no evidence of obstructive uropathy. 2. Multiple ventral hernias, largest midline hernia contains a nondilated loop of transverse colon. 3. Aortic Atherosclerosis (ICD10-I70.0).    Assessment and Plan   Ms. Stash has multiple incisional hernias along her previous midline laparotomy incision. She also has weight regain issues after sleeve gastrectomy. I discussed the option of proceeding with robotic incisional hernia repair with mesh. We discussed the procedure itself as well as its risk, benefits, and alternatives.  We discussed weight regain. We discussed that obesity is a chronic disease with multiple treatments including diet, exercise, medications, and surgery. She is working on diet and exercise. She has tried medications in the past and did not tolerate their side effects, and has concerns with her cardiac history of restarting those medications. She has not been able to afford or obtain wegovy or other of the newer weight loss medications. She is interested in surgical revision to assist with treating this weight regain. She may be a good candidate for duodenal switch as she does not complain much of gastroesophageal reflux. I referred the patient to [facility] in [place] for evaluation as weight loss would certainly help  reduce her risks related to hernia surgery and reduce her risk of hernia recurrence.   Unfortunately, Ms. Humbles hernia pain and intermittent obstructive symptoms have worsened and she is no longer able to stand the symptoms related to the hernia.  With incarcerated colon on imaging, I am worried about her risk of acute hernia emergency.  We decided to proceed with robotic hernia repair despite the increased risk related to her weight.  Today we reviewed the risks, benefits and alternatives and the patient granted consent to proceed.  We will proceed as scheduled.    Quentin Ore, MD  [facility] Use [email or web address] to contact on call provider

## 2021-11-25 NOTE — Progress Notes (Signed)
Call from surgeo 9:59 PM  11/25/2021 -I am bedside CCM  Obese - BMI 50 DM CAD  S/p hernia repair toolk 6h Extubated  Going to ICU for post op care Wants e-ICU monitroing and bedsid CCM if she declines  Signed out to Dr Everardo All in elink     SIGNATURE    Dr. Kalman Shan, M.D., F.C.C.P,  Pulmonary and Critical Care Medicine Staff Physician, [facility] Director - Interstitial Lung Disease  Program  Medical Director - [facility] Long ICU Pulmonary Fibrosis [facility] at [place], [place], [address]  NPI Number:  NPI #[id number] DEA Number: [id number]  Pager: [phone], If no answer  -> Check AMION or Try [phone] Telephone (clinical office): [phone] Telephone (research): [phone]  10:01 PM 11/25/2021

## 2021-11-25 NOTE — Transfer of Care (Signed)
Immediate Anesthesia Transfer of Care Note  Patient: Beverly [name]  Procedure(s) Performed: XI Robotic  Assisted Incisional Hernia Repair With Mesh Bilateral retrorectus myofascial release Bilateral transversus abdominis release (Abdomen)  Patient Location: PACU  Anesthesia Type:General  Level of Consciousness: awake  Airway & Oxygen Therapy: Patient Spontanous Breathing and Patient connected to face mask oxygen  Post-op Assessment: Report given to RN and Post -op Vital signs reviewed and stable  Post vital signs: Reviewed and stable  Last Vitals:  Vitals Value Taken Time  BP 154/107 11/25/21 2106  Temp    Pulse 87 11/25/21 2113  Resp 22 11/25/21 2113  SpO2 97 % 11/25/21 2113  Vitals shown include unvalidated device data.  Last Pain:  Vitals:   11/25/21 1133  TempSrc:   PainSc: 0-No pain         Complications: No notable events documented.

## 2021-11-25 NOTE — Anesthesia Procedure Notes (Signed)
Date/Time: 11/25/2021 8:52 PM  Performed by: Minerva Ends, CRNAOxygen Delivery Method: Simple face mask Placement Confirmation: positive ETCO2 and breath sounds checked- equal and bilateral Dental Injury: Teeth and Oropharynx as per pre-operative assessment

## 2021-11-25 NOTE — Anesthesia Procedure Notes (Signed)
Procedure Name: Intubation Date/Time: 11/25/2021 1:59 PM  Performed by: Elyn Peers, CRNAPre-anesthesia Checklist: Patient identified, Emergency Drugs available, Suction available, Patient being monitored and Timeout performed Patient Re-evaluated:Patient Re-evaluated prior to induction Oxygen Delivery Method: Circle system utilized Preoxygenation: Pre-oxygenation with 100% oxygen Induction Type: IV induction Ventilation: Mask ventilation without difficulty and Oral airway inserted - appropriate to patient size Laryngoscope Size: Miller and 3 Grade View: Grade I Tube type: Oral Tube size: 7.0 mm Number of attempts: 1 Airway Equipment and Method: Stylet Placement Confirmation: ETT inserted through vocal cords under direct vision, positive ETCO2 and breath sounds checked- equal and bilateral Secured at: 22 cm Tube secured with: Tape Dental Injury: Teeth and Oropharynx as per pre-operative assessment

## 2021-11-25 NOTE — Op Note (Signed)
Patient: MARSHAWN NINNEMAN ([birth year], [id number])  Date of Surgery: 11/25/2021   Preoperative Diagnosis: INCARCERATED INCISIONAL HERNIA  Postoperative Diagnosis: INCARCERATED INCISIONAL HERNIA  Surgical Procedure:  XI Robotic  Assisted Incisional Hernia Repair With Mesh  Bilateral posterior rectus myofascial release  Bilateral transversus abdominis release  Operative Team Members:  Surgeon(s) and Role:    * Adreonna Yontz, Hyman Hopes, MD - Primary   Anesthesiologist: Lewie Loron, MD CRNA: Elyn Peers, CRNA; Yolonda Kida, CRNA; Vanessa Carrier, CRNA; Minerva Ends, CRNA   Anesthesia: General   Fluids:  Total I/O In: 800 [I.V.:800] Out: 290 [Urine:90; Blood:200]  Complications: None  Drains:  None  Specimen: None  Disposition:  PACU - hemodynamically stable.  Plan of Care: Admit to inpatient   Indications for Procedure:  Ms. Rieves has multiple incisional hernias along her previous midline laparotomy incision. She also has weight regain issues after sleeve gastrectomy. I discussed the option of proceeding with robotic incisional hernia repair with mesh. We discussed the procedure itself as well as its risk, benefits, and alternatives.  We discussed weight regain. We discussed that obesity is a chronic disease with multiple treatments including diet, exercise, medications, and surgery. She is working on diet and exercise. She has tried medications in the past and did not tolerate their side effects, and has concerns with her cardiac history of restarting those medications. She has not been able to afford or obtain wegovy or other of the newer weight loss medications. She is interested in surgical revision to assist with treating this weight regain. She may be a good candidate for duodenal switch as she does not complain much of gastroesophageal reflux. I referred the patient to [facility] in [place] for evaluation as weight loss would certainly help reduce her risks  related to hernia surgery and reduce her risk of hernia recurrence.    Unfortunately, Ms. Humbles hernia pain and intermittent obstructive symptoms have worsened and she is no longer able to stand the symptoms related to the hernia.  With incarcerated colon on imaging, I am worried about her risk of acute hernia emergency.  We decided to proceed with robotic hernia repair despite the increased risk related to her weight.    I recommended robotic ventral hernia repair with mesh.  The procedure itself as well as the risks, benefits and alternatives were described.  The risks discussed included but were not limited to the risk of infection, bleeding, damage to nearby structures, recurrent hernia, chronic pain, and mesh complication requiring removal.  After a full discussion and all questions answered, the patient granted consent to proceed.  Findings:  Hernia Location: Ventral hernia location: Subxiphoid (M1), Epigastric (M2), and Umbilical (M3) Hernia Size: 17 cm tall x 6 cm  Mesh Size &Type:  35 cm tall x 27 cm wide Bard Soft Mesh Mesh Position: Sublay - Retromuscular Myofascial Releases: Bilateral posterior rectus myofascial release  Bilateral transversus abdominis release   Description of Procedure: The patient was positioned supine, moderately flexed at the umbilical level, padded and secured on the operating table.  A timeout procedure was performed.    What is described is a robotic, totally extraperitoneal retromuscular incisional hernia repair with bilateral rectus myofascial release, bilateral transversus abdominis release and retromuscular mesh placement.    The patient's hernia required a variation of my technique.  I talked from just below the umbilicus and worked my way superiorly along the abdominal wall as the hernia was located in the subxiphoid region and I was  concerned about injuring the colon if I attempted to cross over through the falciform ligament.  Unfortunately, I was  unable to do the full dissection from a inferior to superior approach, so I reduced the hernia and worked from an inferior to superior approach to fully complete the upper half of the abdominal dissection, and then redocked the robot working in my typical superior to inferior approach to complete the lower half of the dissection.  This change my technique added additional time and difficulty to the case.  In the end, the final product was the same as any robotic, totally extraperitoneal retromuscular incisional hernia repair with bilateral rectus myofascial release, bilateral transversus abdominis release and retromuscular mesh placement   Laparoscopic Portion: The retrorectus space was entered in the LEFT hypochondrium, at approximately the midclavicular line utilizing a 5 mm optical-viewing trocar.  Upon safe entry into this space, it was insufflated while performing a blunt dissection with the camera still in the optical trocar.   A rectus myofascial release was performed on the on the LEFT side. Dissection was carried out laterally in the retromuscular plane to the edge of the rectus sheath progressively disconnecting the rectus muscle from the underlying posterior rectus sheath. Both the segmental innervation as well as the intercostal artery and vein brances to the rectus muscle were individually preserved.    During the left sided retrorectus dissection, a 8 mm trocar was placed into the lateral most edge of the retrorectus space.  Different than my typical technique, I placed this trocar just below the level of the umbilicus in order to dock from below.   Using use first 2 initial trocars, I was able to dissect below the arcuate line, crossover in the preperitoneal plane below the umbilicus, and placed 2 additional 8 mm trocars: 1 just to the left of the midline and 1 in the patient's right lower quadrant.  The initial 5 mm trocar was upgraded to a 12 mm assistant port.  After working for short time  robotically, the midline and right lower quadrant incisions were too low and I was unable to use these instruments as the instruments were hitting the patient's pannus.  I scrubbed back into bedside and made 2 new incisions higher up on the abdomen.  I then scrubbed back out of the case and returned to the robotic portion of the case.  Robotic Portion: The Intuitive daVinci Xi surgical robot was docked from the right side of the patient working toward the upper abdomen.  The procedure begun from the robotic console. A fenestrated bipolar instrument and monopolar shears were used for the dissection.  After identification of the right and left rectus sheath, the posterior rectus sheath was incised bilaterally to connect the retromuscular space on the left, the preperitoneal space beneath the linea alba and the retromuscular space on the right. A rectus myofascial release was performed on the RIGHT side.  Blunt dissection was carried out laterally in the retromuscular plane to the edge of the rectus sheath progressively disconnecting the rectus muscle from the underlying posterior rectus sheath. Both the segmental innervation as well as the intercostal artery and vein brances to the rectus muscle were individually preserved.    Dissection preserved the peritoneum and the preperitoneal fat in the midline as it was gently dissected off of the overlying linea alba.  On the right side, the posterior rectus sheath was progressively disconnected from its insertion on the linea alba. This allowed for progression of the right side rectus  myofascial release.  The rectus myofascial release accomplished medialization of the posterior rectus sheath towards the midline and disinsertion of the rectus muscle from its surrounding fascia, and thus its encasement in the rectus sheath, allowing for widening of the rectus muscle and transfer of the rectus flap towards the midline.  This will allow for future inset of the medial  aspect of the flap for abdominal wall reconstruction.  Similarly, on the left side, the posterior rectus sheath was also progressively disconnected from its insertion on the linea alba.  This allowed for progression of the left side rectus myofascial release.  The rectus myofascial release accomplished medialization of the posterior rectus sheath towards the midline and disinsertion of the rectus muscle from its surrounding fascia, and thus its encasement in the rectus sheath, allowing for widening of the rectus muscle and transfer of the rectus flap towards the midline.  This will allow for future inset of the medial aspect of the flap for abdominal wall reconstruction.  During the dissection of the midline the hernia defect was identified and the hernia sac was not reducible, therefore the hernia peritoneum was incised circumferentially around the edge of the hernia defect which left a defect within the peritoneum in the midline.  I encountered the transverse colon incarcerated in this hernia and this was carefully dissected free of its attachments and reduced back into the abdomen.  The defect in the posterior layer was later closed with a running 2-0 Ethicon Stratafix Spiral PDS suture.  Both the left and the right rectus myofascial releases were performed toward the upper abdomen.  The posterior rectus sheath was divided just inferior to the rib cage to avoid disinserted in the diaphragm.  I dropped down to the preperitoneal plane and continue the dissection underneath the ribs and xiphoid process.   A transversus abdominis release (TAR) was performed on the right side.  The transversus abdominis muscle was identified deep to the posterior rectus sheath and incised vertically along its entire length, entering the pre-peritoneal or pre-transversalis fascia plane.  This disinserted the transversus abdominis muscle from the linea semilunaris.  Since the intercostal nerves, arteries and veins had been  preserved during the rectus myofascial release portion of the procedure, they remained intact during the TAR. The peritoneum was subsequently peeled away from the underside of the divided transversus abdominis muscle.  This dissection was carried out laterally towards the retroperitoneum.  The TAR accomplished additional medialization of the posterior rectus sheath with its attached peritoneum towards the midline to allow for visceral sac closure.  The TAR also provided further offset of tension of the rectus muscle flap with additional transfer of the rectus muscle towards the midline, as it remained attached to the external and internal abdominal oblique muscles.  This will allow for future inset of the medial aspect of the flap for abdominal wall reconstruction.   A transversus abdominis release (TAR) was performed on the left side.  The transversus abdominis muscle was identified deep to the posterior rectus sheath and incised vertically along its entire length, entering the pre-peritoneal or pre-transversalis fascia plane.  This disinserted the transversus abdominis muscle from the linea semilunaris.  Since the intercostal nerves, arteries and veins had been preserved during the rectus myofascial release portion of the procedure, they remained intact during the TAR. The peritoneum was subsequently peeled away from the underside of the divided transversus abdominis muscle.  This dissection was carried out laterally towards the retroperitoneum.  The TAR accomplished additional medialization of  the posterior rectus sheath with its attached peritoneum towards the midline to allow for visceral sac closure.  The TAR also provided further offset of tension of the rectus muscle flap with additional transfer of the rectus muscle towards the midline, as it remained attached to the external and internal abdominal oblique muscles.  This will allow for future inset of the medial aspect of the flap for abdominal wall  reconstruction.   To fully complete the inferior aspect of this dissection, I decided to redocked the robot later in the case to complete the lower portion of the dissection.  The robot was redocked subcostally with 3 trocars traversing the rectus muscle.  From this location, I was able to complete the lower portion of the dissection.  The dissection was carried towards the lower abdomen, past the arcuate line bilaterally.  During this dissection, the peritoneum and preperitoneal fat in the midline were further preserved below the hernia as they were dissected off of the overlying linea alba.   The hernia defect area was now visualized fully.  The hernia defects were located in the subxiphoid, epigastric, umbilical regions.  The hernia defect area measured 6 cm wide by 17 cm tall.  This hernia defect area included multiple small Swiss cheese type hernia defects and 2 larger hernia defects 1 that had the colon incarcerated in it and one at the umbilicus.  Due to the patient's body habitus and the shape of these 2 defects, decision was made to close the 2 major defects transversely using #1 STRATAFIX PDS plus suture.  I felt that closing the patient's diastases would put undue tension on the repair.   The dissected out retrorectus space was measured with a metric ruler so as to determine the size of the proposed mesh.    The robot was undocked and the laparoscope was inserted, inspecting for hemostasis.  The mesh deployment was performed laparoscopically.  A piece of Bard Soft was opened and trimmed to 35 cm tall x 27 cm wide. The mesh was advanced into the retrorectus space and the mesh positioned flat against the intact posterior rectus sheaths. The mesh was not fixated as it occupied the entire retromuscular plane, and also covered all of the trocars.  The trocars were removed and the skin closed with 4-0 Monocryl subcuticular sutures and skin glue.   Ivar Drape, MD General, Bariatric, &  Minimally Invasive Surgery [facility], [place]

## 2021-11-26 DIAGNOSIS — K430 Incisional hernia with obstruction, without gangrene: Secondary | ICD-10-CM | POA: Diagnosis not present

## 2021-11-26 LAB — GLUCOSE, CAPILLARY
Glucose-Capillary: 199 mg/dL — ABNORMAL HIGH (ref 70–99)
Glucose-Capillary: 171 mg/dL — ABNORMAL HIGH (ref 70–99)
Glucose-Capillary: 235 mg/dL — ABNORMAL HIGH (ref 70–99)
Glucose-Capillary: 307 mg/dL — ABNORMAL HIGH (ref 70–99)
Glucose-Capillary: 322 mg/dL — ABNORMAL HIGH (ref 70–99)

## 2021-11-26 LAB — CBC
HCT: 39 % (ref 36.0–46.0)
Hemoglobin: 13.2 g/dL (ref 12.0–15.0)
MCH: 31.3 pg (ref 26.0–34.0)
MCHC: 33.8 g/dL (ref 30.0–36.0)
MCV: 92.4 fL (ref 80.0–100.0)
Platelets: 246 10*3/uL (ref 150–400)
RBC: 4.22 MIL/uL (ref 3.87–5.11)
RDW: 12.7 % (ref 11.5–15.5)
WBC: 13.7 10*3/uL — ABNORMAL HIGH (ref 4.0–10.5)
nRBC: 0 % (ref 0.0–0.2)

## 2021-11-26 LAB — BASIC METABOLIC PANEL
Anion gap: 11 (ref 5–15)
BUN: 20 mg/dL (ref 6–20)
CO2: 24 mmol/L (ref 22–32)
Calcium: 8.5 mg/dL — ABNORMAL LOW (ref 8.9–10.3)
Chloride: 104 mmol/L (ref 98–111)
Creatinine, Ser: 0.7 mg/dL (ref 0.44–1.00)
GFR, Estimated: >60 mL/min (ref >60)
Glucose, Bld: 258 mg/dL — ABNORMAL HIGH (ref 70–99)
Potassium: 4.1 mmol/L (ref 3.5–5.1)
Sodium: 139 mmol/L (ref 135–145)

## 2021-11-26 LAB — HEMOGLOBIN A1C
Hgb A1c MFr Bld: 8.6 % — ABNORMAL HIGH (ref 4.8–5.6)
Mean Plasma Glucose: 200.12 mg/dL

## 2021-11-26 LAB — MRSA NEXT GEN BY PCR, NASAL: MRSA by PCR Next Gen: NOT DETECTED

## 2021-11-26 MED ORDER — CHLORHEXIDINE GLUCONATE CLOTH 2 % EX PADS: 6.0000 | MEDICATED_PAD | Freq: Every day | CUTANEOUS | Status: DC

## 2021-11-26 MED ORDER — ORAL CARE MOUTH RINSE: 15.0000 mL | OROMUCOSAL | Status: DC | PRN

## 2021-11-26 NOTE — Plan of Care (Signed)
  Problem: Coping: Goal: Level of anxiety will decrease Outcome: Progressing   Problem: Pain Managment: Goal: General experience of comfort will improve Outcome: Progressing   Problem: Safety: Goal: Ability to remain free from injury will improve Outcome: Progressing   

## 2021-11-26 NOTE — Plan of Care (Signed)
  Problem: Pain Managment: Goal: General experience of comfort will improve 11/26/2021 2355 by Kizzie Bane, RN Outcome: Progressing 11/26/2021 2345 by Kizzie Bane, RN Outcome: Progressing   Problem: Safety: Goal: Ability to remain free from injury will improve 11/26/2021 2355 by Kizzie Bane, RN Outcome: Progressing 11/26/2021 2345 by Kizzie Bane, RN Outcome: Progressing   Problem: Coping: Goal: Level of anxiety will decrease 11/26/2021 2355 by Kizzie Bane, RN Outcome: Progressing 11/26/2021 2345 by Kizzie Bane, RN Outcome: Progressing

## 2021-11-26 NOTE — Progress Notes (Addendum)
1 Day Post-Op   Subjective/Chief Complaint: No acute issues overnight Has incisional pain but is controlled Denies shortness of breath   Objective: Vital signs in last 24 hours: Temp:  [97.7 F (36.5 C)-99 F (37.2 C)] 98 F (36.7 C) (09/02 0400) Pulse Rate:  [73-92] 73 (09/02 0700) Resp:  [8-26] 16 (09/02 0700) BP: (127-178)/(46-107) 156/62 (09/02 0700) SpO2:  [91 %-100 %] 100 % (09/02 0700) Weight:  [114.8 kg-120.7 kg] 120.7 kg (09/01 2330) Last BM Date :  (PTA)  Intake/Output from previous day: 09/01 0701 - 09/02 0700 In: 4851.7 [I.V.:4651.7; IV Piggyback:200] Out: 1247 [Urine:1047; Blood:200] Intake/Output this shift: No intake/output data recorded.  Exam: Awake and alert and appears comfortable Lungs clear Abdomen is soft.  Multiple well-healed incisions  Lab Results:  Recent Labs    11/26/21 0019  WBC 13.7*  HGB 13.2  HCT 39.0  PLT 246   BMET Recent Labs    11/26/21 0019  NA 139  K 4.1  CL 104  CO2 24  GLUCOSE 258*  BUN 20  CREATININE 0.70  CALCIUM 8.5*   PT/INR No results for input(s): "LABPROT", "INR" in the last 72 hours. ABG No results for input(s): "PHART", "HCO3" in the last 72 hours.  Invalid input(s): "PCO2", "PO2"  Studies/Results: No results found.  Anti-infectives: Anti-infectives (From admission, onward)    Start     Dose/Rate Route Frequency Ordered Stop   11/25/21 1200  ceFAZolin (ANCEF) IVPB 2g/100 mL premix        2 g 200 mL/hr over 30 Minutes Intravenous On call to O.R. 11/25/21 1154 11/25/21 1823       Assessment/Plan: s/p Procedure(s): XI Robotic  Assisted Incisional Hernia Repair With Mesh Bilateral retrorectus myofascial release Bilateral transversus abdominis release (N/A)  Postop day #1  We will transfer to the floor Continue oxygen and pulmonary toilet Okay to start ambulating Continue current diet plans  LOS: 1 day    Abigail Miyamoto MD 11/26/2021

## 2021-11-26 NOTE — Evaluation (Signed)
Physical Therapy Evaluation Patient Details Name: MANEH SIEBEN MRN: [id number] DOB: 09/28/1975 Today's Date: 11/26/2021  History of Present Illness  Pt is 46 yo female admitted on 11/25/21 for hernia repair.  Pt is s/p XI Robotic  Assisted Incisional Hernia Repair With Mesh ; Bilateral posterior rectus myofascial release ; Bilateral transversus abdominis release on 11/25/21.  Pt with hx including but not limited to CAD, DM, hx DVT, HLD, HTN, MI, PVD, OA, bariatric surgery 2015, CABG x 3 2021, and sleep apnea  Clinical Impression  Pt admitted with above diagnosis.  Pt required min A for bed mobility but otherwise supervision to min guard level.  She has home support and DME.  Pt has had prior abdominal surgery and CABG surgery so is familiar with transfer techniques, recovery, and need for mobility.  Pt needs to ambulate with nursing or family but does not required skilled PT intervention.        Recommendations for follow up therapy are one component of a multi-disciplinary discharge planning process, led by the attending physician.  Recommendations may be updated based on patient status, additional functional criteria and insurance authorization.  Follow Up Recommendations No PT follow up      Assistance Recommended at Discharge PRN  Patient can return home with the following  A little help with walking and/or transfers;A little help with bathing/dressing/bathroom;Assistance with cooking/housework;Help with stairs or ramp for entrance    Equipment Recommendations None recommended by PT  Recommendations for Other Services       Functional Status Assessment Patient has had a recent decline in their functional status and demonstrates the ability to make significant improvements in function in a reasonable and predictable amount of time.     Precautions / Restrictions Precautions Precautions: None      Mobility  Bed Mobility Overal bed mobility: Needs Assistance Bed Mobility: Sidelying  to Sit   Sidelying to sit: Min assist, HOB elevated       General bed mobility comments: Light min A to lift trunk for pain control; discussed sleeping in recliner if needed at home (reports she did after CABG)    Transfers Overall transfer level: Needs assistance Equipment used: Rolling walker (2 wheels) Transfers: Sit to/from Stand Sit to Stand: Supervision                Ambulation/Gait Ambulation/Gait assistance: Min guard, Supervision Gait Distance (Feet): 200 Feet Assistive device: Rolling walker (2 wheels), None Gait Pattern/deviations: Step-through pattern Gait velocity: decreased     General Gait Details: Min guard progressed to supervision; RW in hallway and a few steps in room without AD; RW mainly for pain control/support  Stairs            Wheelchair Mobility    Modified Rankin (Stroke Patients Only)       Balance Overall balance assessment: Needs assistance Sitting-balance support: No upper extremity supported Sitting balance-Leahy Scale: Good     Standing balance support: Bilateral upper extremity supported, No upper extremity supported Standing balance-Leahy Scale: Good Standing balance comment: Used RW in hallway for pain control/abd support but able to take some steps without                             Pertinent Vitals/Pain Pain Assessment Pain Assessment: 0-10 Pain Score: 2  Pain Location: abdomen Pain Descriptors / Indicators: Sore Pain Intervention(s): Limited activity within patient's tolerance, Monitored during session    Home Living Family/patient  expects to be discharged to:: Private residence Living Arrangements: Spouse/significant other Available Help at Discharge: Family;Available 24 hours/day (plans to stay with parents so that she will have 24 hr help) Type of Home: House Home Access: Stairs to enter Entrance Stairs-Rails: Right;Left Entrance Stairs-Number of Steps: 7; they also have stair lift from  basement   Home Layout: One level Home Equipment: Grab bars - tub/shower;Shower seat Additional Comments: Has access to rollator and RW from mom; information above on Mother's house    Prior Function Prior Level of Function : Independent/Modified Independent;Driving             Mobility Comments: could ambulate in community ADLs Comments: performs IADLs; has a 92 yo daughter     Hand Dominance        Extremity/Trunk Assessment   Upper Extremity Assessment Upper Extremity Assessment: Defer to OT evaluation    Lower Extremity Assessment Lower Extremity Assessment: Overall WFL for tasks assessed    Cervical / Trunk Assessment Cervical / Trunk Assessment: Other exceptions Cervical / Trunk Exceptions: obesity; abdominal sx  Communication      Cognition Arousal/Alertness: Awake/alert Behavior During Therapy: WFL for tasks assessed/performed Overall Cognitive Status: Within Functional Limits for tasks assessed                                 General Comments: Pt reports open heart surgery and prior abdominal surgery -familiar with recovery, transfer techniques, pain/soreness        General Comments General comments (skin integrity, edema, etc.): On 3 L O2 100%; tried RA and maintained 98% rest ; 91% majority of walk but did drop to 88% last few feet.  Replaced O2 and sats recovered.    Exercises     Assessment/Plan    PT Assessment Patient does not need any further PT services  PT Problem List         PT Treatment Interventions      PT Goals (Current goals can be found in the Care Plan section)  Acute Rehab PT Goals Patient Stated Goal: return home PT Goal Formulation: All assessment and education complete, DC therapy    Frequency       Co-evaluation               AM-PAC PT "6 Clicks" Mobility  Outcome Measure Help needed turning from your back to your side while in a flat bed without using bedrails?: A Little Help needed moving  from lying on your back to sitting on the side of a flat bed without using bedrails?: A Little Help needed moving to and from a bed to a chair (including a wheelchair)?: A Little Help needed standing up from a chair using your arms (e.g., wheelchair or bedside chair)?: A Little Help needed to walk in hospital room?: A Little Help needed climbing 3-5 steps with a railing? : A Little 6 Click Score: 18    End of Session Equipment Utilized During Treatment: Other (comment) (abdominal binder) Activity Tolerance: Patient tolerated treatment well Patient left: in chair;with call bell/phone within reach;with family/visitor present Nurse Communication: Mobility status PT Visit Diagnosis: Other abnormalities of gait and mobility (R26.89)    Time: 6789-3810 PT Time Calculation (min) (ACUTE ONLY): 23 min   Charges:   PT Evaluation $PT Eval Low Complexity: 1 Low          Cola Gane, PT Acute Rehab [employer] [phone]  Billey Chang Shaqueena Mauceri 11/26/2021, 2:20 PM

## 2021-11-26 NOTE — Evaluation (Signed)
Occupational Therapy Evaluation Patient Details Name: Beverly [name] MRN: [id number] DOB: 1975/05/28 Today's Date: 11/26/2021   History of Present Illness Pt is 46 yo female admitted on 11/25/21 for hernia repair.  Pt is s/p XI Robotic  Assisted Incisional Hernia Repair With Mesh ; Bilateral posterior rectus myofascial release ; Bilateral transversus abdominis release on 11/25/21.  Pt with hx including but not limited to CAD, DM, hx DVT, HLD, HTN, MI, PVD, OA, bariatric surgery 2015, CABG x 3 2021, and sleep apnea   Clinical Impression   Patient is a 46 year old female who was admitted for above. Patient lives at home with husband and young child. Patient was noted to have increased abdominal pain, and stiffness in RLE impacting ability to engage in ADLs with increased independence. Patient would benefit from continued skilled OT services for compensatory strategies for LB dress/bathing tasks to address noted deficits in order to improve overall safety and independence in ADLs. Anticipate that patient will be able to progress during acute stay to not need follow up in next level of care.       Recommendations for follow up therapy are one component of a multi-disciplinary discharge planning process, led by the attending physician.  Recommendations may be updated based on patient status, additional functional criteria and insurance authorization.   Follow Up Recommendations  No OT follow up    Assistance Recommended at Discharge Frequent or constant Supervision/Assistance  Patient can return home with the following A little help with walking and/or transfers;A little help with bathing/dressing/bathroom;Assistance with cooking/housework;Direct supervision/assist for financial management;Assist for transportation;Help with stairs or ramp for entrance;Direct supervision/assist for medications management    Functional Status Assessment  Patient has had a recent decline in their functional status and  demonstrates the ability to make significant improvements in function in a reasonable and predictable amount of time.  Equipment Recommendations  Other (comment) (total hip kit)    Recommendations for Other Services       Precautions / Restrictions Precautions Precautions: None Precaution Comments: abdominal binder Restrictions Weight Bearing Restrictions: No      Mobility Bed Mobility Overal bed mobility: Needs Assistance Bed Mobility: Sidelying to Sit   Sidelying to sit: Min assist, HOB elevated       General bed mobility comments: Light min A to lift trunk for pain control; discussed sleeping in recliner if needed at home (reports she did after CABG)    Transfers Overall transfer level: Needs assistance Equipment used: Rolling walker (2 wheels) Transfers: Sit to/from Stand Sit to Stand: Supervision                  Balance Overall balance assessment: Needs assistance Sitting-balance support: No upper extremity supported Sitting balance-Leahy Scale: Good     Standing balance support: Bilateral upper extremity supported, No upper extremity supported Standing balance-Leahy Scale: Good Standing balance comment: Used RW in hallway for pain control/abd support but able to take some steps without                           ADL either performed or assessed with clinical judgement   ADL Overall ADL's : Needs assistance/impaired Eating/Feeding: Modified independent;Sitting   Grooming: Modified independent;Sitting   Upper Body Bathing: Minimal assistance;Sitting   Lower Body Bathing: Sitting/lateral leans;Maximal assistance   Upper Body Dressing : Maximal assistance;Sitting Upper Body Dressing Details (indicate cue type and reason): patient is max A to don abdominal binder. Lower Body  Dressing: Maximal assistance;Bed level Lower Body Dressing Details (indicate cue type and reason): patient is unable to bend R knee "its stiff" was educated on AE  training during next session Toilet Transfer: Min guard;Rolling walker (2 wheels);Ambulation Toilet Transfer Details (indicate cue type and reason): functional mobility in hallway with increased time with RW and O2 dropping ot 88% on RA after activity. Toileting- Clothing Manipulation and Hygiene: Sit to/from stand;Minimal assistance               Vision Baseline Vision/History: 1 Wears glasses Patient Visual Report: No change from baseline       Perception     Praxis      Pertinent Vitals/Pain Pain Assessment Pain Assessment: 0-10 Pain Score: 2  Pain Location: abdomen Pain Descriptors / Indicators: Sore Pain Intervention(s): Limited activity within patient's tolerance, Monitored during session     Hand Dominance     Extremity/Trunk Assessment Upper Extremity Assessment Upper Extremity Assessment: RUE deficits/detail RUE Deficits / Details: has history of nerve damage from open heart dsurgery with limited ROM able to reach about 90 degrees abduction with increased effort   Lower Extremity Assessment Lower Extremity Assessment: Defer to PT evaluation   Cervical / Trunk Assessment Cervical / Trunk Assessment: Other exceptions Cervical / Trunk Exceptions: obesity; abdominal sx   Communication     Cognition Arousal/Alertness: Awake/alert Behavior During Therapy: WFL for tasks assessed/performed Overall Cognitive Status: Within Functional Limits for tasks assessed                                 General Comments: Pt reports open heart surgery and prior abdominal surgery -familiar with recovery, transfer techniques, pain/soreness     General Comments  On 3 L O2 100%; tried RA and maintained 98% rest ; 91% majority of walk but did drop to 88% last few feet.  Replaced O2 and sats recovered.    Exercises     Shoulder Instructions      Home Living Family/patient expects to be discharged to:: Private residence Living Arrangements: Spouse/significant  other Available Help at Discharge: Family;Available 24 hours/day Type of Home: House Home Access: Stairs to enter [employer] of Steps: 7; they also have stair lift from basement Entrance Stairs-Rails: Right;Left Home Layout: One level     Bathroom Shower/Tub: Tub/shower unit;Walk-in shower   Bathroom Toilet: Standard     Home Equipment: Grab bars - tub/shower;Shower seat   Additional Comments: Has access to rollator and RW from mom; information above on Mother's house      Prior Functioning/Environment Prior Level of Function : Independent/Modified Independent;Driving             Mobility Comments: could ambulate in community ADLs Comments: performs IADLs; has a 30 yo daughter        OT Problem List: Decreased strength;Decreased activity tolerance;Impaired balance (sitting and/or standing);Decreased safety awareness;Cardiopulmonary status limiting activity;Decreased knowledge of precautions;Decreased knowledge of use of DME or AE;Pain      OT Treatment/Interventions: Self-care/ADL training;Energy conservation;DME and/or AE instruction;Therapeutic activities;Patient/family education;Balance training    OT Goals(Current goals can be found in the care plan section) Acute Rehab OT Goals Patient Stated Goal: to get back home OT Goal Formulation: With patient/family Time For Goal Achievement: 12/10/21 Potential to Achieve Goals: Fair  OT Frequency: Min 2X/week    Co-evaluation              AM-PAC OT "6 Clicks" Daily Activity  Outcome Measure Help from another person eating meals?: None Help from another person taking care of personal grooming?: None Help from another person toileting, which includes using toliet, bedpan, or urinal?: A Little Help from another person bathing (including washing, rinsing, drying)?: A Little Help from another person to put on and taking off regular upper body clothing?: A Little Help from another person to put on and  taking off regular lower body clothing?: A Little 6 Click Score: 20   End of Session Equipment Utilized During Treatment: Gait belt;Rolling walker (2 wheels) Nurse Communication: Mobility status  Activity Tolerance: Patient tolerated treatment well Patient left: in chair;with call bell/phone within reach;with family/visitor present  OT Visit Diagnosis: Unsteadiness on feet (R26.81);Other abnormalities of gait and mobility (R26.89);Pain                Time: 3462-1947 OT Time Calculation (min): 20 min Charges:  OT General Charges $OT Visit: 1 Visit OT Evaluation $OT Eval Low Complexity: 1 Low  Sincere Liuzzi OTR/L, MS Acute Rehabilitation Department Office# [phone]   Marcellina Millin 11/26/2021, 3:47 PM

## 2021-11-27 DIAGNOSIS — K430 Incisional hernia with obstruction, without gangrene: Secondary | ICD-10-CM | POA: Diagnosis not present

## 2021-11-27 LAB — CBC
HCT: 36.6 % (ref 36.0–46.0)
Hemoglobin: 12.2 g/dL (ref 12.0–15.0)
MCH: 30.8 pg (ref 26.0–34.0)
MCHC: 33.3 g/dL (ref 30.0–36.0)
MCV: 92.4 fL (ref 80.0–100.0)
Platelets: 228 10*3/uL (ref 150–400)
RBC: 3.96 MIL/uL (ref 3.87–5.11)
RDW: 13 % (ref 11.5–15.5)
WBC: 11.5 10*3/uL — ABNORMAL HIGH (ref 4.0–10.5)
nRBC: 0 % (ref 0.0–0.2)

## 2021-11-27 LAB — GLUCOSE, CAPILLARY
Glucose-Capillary: 200 mg/dL — ABNORMAL HIGH (ref 70–99)
Glucose-Capillary: 204 mg/dL — ABNORMAL HIGH (ref 70–99)
Glucose-Capillary: 244 mg/dL — ABNORMAL HIGH (ref 70–99)

## 2021-11-27 LAB — BASIC METABOLIC PANEL
Anion gap: 8 (ref 5–15)
BUN: 19 mg/dL (ref 6–20)
CO2: 27 mmol/L (ref 22–32)
Calcium: 8.7 mg/dL — ABNORMAL LOW (ref 8.9–10.3)
Chloride: 104 mmol/L (ref 98–111)
Creatinine, Ser: 0.64 mg/dL (ref 0.44–1.00)
GFR, Estimated: >60 mL/min (ref >60)
Glucose, Bld: 233 mg/dL — ABNORMAL HIGH (ref 70–99)
Potassium: 3.6 mmol/L (ref 3.5–5.1)
Sodium: 139 mmol/L (ref 135–145)

## 2021-11-27 MED ORDER — OXYCODONE HCL 5 MG PO TABS: 5.0000 mg | ORAL_TABLET | Freq: Four times a day (QID) | ORAL | 0 refills | Status: DC | PRN

## 2021-11-27 NOTE — Progress Notes (Signed)
2 Days Post-Op   Subjective/Chief Complaint: No complaints other than some soreness. Tolerating diet   Objective: Vital signs in last 24 hours: Temp:  [97.8 F (36.6 C)-98.6 F (37 C)] 98.1 F (36.7 C) (09/03 0433) Pulse Rate:  [72-95] 95 (09/03 0433) Resp:  [9-21] 16 (09/03 0433) BP: (118-153)/(45-68) 127/61 (09/03 0433) SpO2:  [88 %-100 %] 88 % (09/03 0433) Last BM Date : 11/24/21  Intake/Output from previous day: 09/02 0701 - 09/03 0700 In: 516.7 [P.O.:360; I.V.:156.7] Out: 300 [Urine:300] Intake/Output this shift: No intake/output data recorded.  General appearance: alert and cooperative Resp: clear to auscultation bilaterally Cardio: regular rate and rhythm GI: soft, mild tenderness. Incisions look good  Lab Results:  Recent Labs    11/26/21 0019 11/27/21 0335  WBC 13.7* 11.5*  HGB 13.2 12.2  HCT 39.0 36.6  PLT 246 228   BMET Recent Labs    11/26/21 0019 11/27/21 0335  NA 139 139  K 4.1 3.6  CL 104 104  CO2 24 27  GLUCOSE 258* 233*  BUN 20 19  CREATININE 0.70 0.64  CALCIUM 8.5* 8.7*   PT/INR No results for input(s): "LABPROT", "INR" in the last 72 hours. ABG No results for input(s): "PHART", "HCO3" in the last 72 hours.  Invalid input(s): "PCO2", "PO2"  Studies/Results: No results found.  Anti-infectives: Anti-infectives (From admission, onward)    Start     Dose/Rate Route Frequency Ordered Stop   11/25/21 1200  ceFAZolin (ANCEF) IVPB 2g/100 mL premix        2 g 200 mL/hr over 30 Minutes Intravenous On call to O.R. 11/25/21 1154 11/25/21 1823       Assessment/Plan: s/p Procedure(s): XI Robotic  Assisted Incisional Hernia Repair With Mesh Bilateral retrorectus myofascial release Bilateral transversus abdominis release (N/A) Advance diet Discharge  LOS: 1 day    Chevis Pretty III 11/27/2021

## 2021-11-27 NOTE — Clinical Social Work Note (Signed)
  [facility]) Screening Note   Patient Details  Name: ENGLISH TOMER Date of Birth: [birth year]   Transition of Care [facility]) CM/SW Contact:    Darleene Cleaver, LCSW Phone Number: 11/27/2021, 10:45 AM    Transition of Care Department [facility]) has reviewed patient and no TOC needs have been identified at this time. We will continue to monitor patient advancement through interdisciplinary progression rounds. If new patient transition needs arise, please place a TOC consult.

## 2021-11-27 NOTE — Anesthesia Postprocedure Evaluation (Signed)
Anesthesia Post Note  Patient: Beverly [name]  Procedure(s) Performed: XI Robotic  Assisted Incisional Hernia Repair With Mesh Bilateral retrorectus myofascial release Bilateral transversus abdominis release (Abdomen)     Patient location during evaluation: PACU Anesthesia Type: General Level of consciousness: sedated and patient cooperative Pain management: pain level controlled Vital Signs Assessment: post-procedure vital signs reviewed and stable Respiratory status: spontaneous breathing Cardiovascular status: stable Anesthetic complications: no   No notable events documented.  Last Vitals:  Vitals:   11/26/21 2130 11/27/21 0433  BP:  127/61  Pulse:  95  Resp:  16  Temp:  36.7 C  SpO2: 93% (!) 88%    Last Pain:  Vitals:   11/27/21 0945  TempSrc:   PainSc: 1                  Lewie Loron

## 2021-11-29 ENCOUNTER — Encounter (HOSPITAL_COMMUNITY): Payer: Self-pay | Admitting: Surgery

## 2021-11-29 ENCOUNTER — Other Ambulatory Visit: Payer: Self-pay

## 2021-11-29 LAB — GLUCOSE, CAPILLARY: Glucose-Capillary: 256 mg/dL — ABNORMAL HIGH (ref 70–99)

## 2021-11-29 NOTE — Discharge Summary (Signed)
Patient ID: Beverly [name] [id number] 46 y.o. 09-14-1975  11/25/2021  Discharge date and time: 11/29/2021  Admitting Physician: Beverly [name]  Discharge Physician: Beverly [name]  Admission Diagnoses: S/P hernia repair [E28.003, Z87.19] Incisional hernia [K43.2] Patient Active Problem List   Diagnosis Date Noted   S/P hernia repair 11/25/2021   Incisional hernia 11/25/2021   Tick-borne relapsing fever 05/26/2021   Anxiety disorder 06/14/2020   Cellulitis 06/14/2020   Gastro-esophageal reflux disease without esophagitis 06/14/2020   Hyperglycemia due to type 2 diabetes mellitus (HCC) 06/14/2020   Hypothyroidism 06/14/2020   Idiopathic peripheral autonomic neuropathy 06/14/2020   Old myocardial infarction 06/14/2020   Primary generalized (osteo)arthritis 06/14/2020   Vitamin D deficiency 06/14/2020   Diabetes mellitus without complication (HCC)    Multiple thyroid nodules    Type 2 diabetes mellitus with hyperglycemia, with long-term current use of insulin (HCC) 09/23/2019   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, with long-term current use of insulin (HCC) 09/23/2019   Type 2 diabetes mellitus with diabetic polyneuropathy, with long-term current use of insulin (HCC) 09/23/2019   Dyslipidemia 09/23/2019   Type 2 diabetes mellitus without complications (HCC) 09/23/2019   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   Hypokalemia 06/17/2019   Other vitamin B12 deficiency anemias 06/17/2019   Positive D dimer    Obesity    Unstable angina (HCC)    Chest pain 06/16/2019   CAD (coronary artery disease) 06/16/2019   Hypertension    Insulin-requiring or dependent type II diabetes mellitus (HCC)    History of DVT (deep vein thrombosis)    S/P bariatric surgery 02/19/2014   Encounter for long-term (current) use of insulin (HCC) 07/08/2012   Hematuria 07/08/2012   Mixed hyperlipidemia 07/08/2012   Nephrolithiasis 07/08/2012   CAD (coronary artery disease), native coronary  artery 07/08/2012   Type II diabetes mellitus (HCC) 07/08/2012     Discharge Diagnoses: Hernia Patient Active Problem List   Diagnosis Date Noted   S/P hernia repair 11/25/2021   Incisional hernia 11/25/2021   Tick-borne relapsing fever 05/26/2021   Anxiety disorder 06/14/2020   Cellulitis 06/14/2020   Gastro-esophageal reflux disease without esophagitis 06/14/2020   Hyperglycemia due to type 2 diabetes mellitus (HCC) 06/14/2020   Hypothyroidism 06/14/2020   Idiopathic peripheral autonomic neuropathy 06/14/2020   Old myocardial infarction 06/14/2020   Primary generalized (osteo)arthritis 06/14/2020   Vitamin D deficiency 06/14/2020   Diabetes mellitus without complication (HCC)    Multiple thyroid nodules    Type 2 diabetes mellitus with hyperglycemia, with long-term current use of insulin (HCC) 09/23/2019   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, with long-term current use of insulin (HCC) 09/23/2019   Type 2 diabetes mellitus with diabetic polyneuropathy, with long-term current use of insulin (HCC) 09/23/2019   Dyslipidemia 09/23/2019   Type 2 diabetes mellitus without complications (HCC) 09/23/2019   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   Hypokalemia 06/17/2019   Other vitamin B12 deficiency anemias 06/17/2019   Positive D dimer    Obesity    Unstable angina (HCC)    Chest pain 06/16/2019   CAD (coronary artery disease) 06/16/2019   Hypertension    Insulin-requiring or dependent type II diabetes mellitus (HCC)    History of DVT (deep vein thrombosis)    S/P bariatric surgery 02/19/2014   Encounter for long-term (current) use of insulin (HCC) 07/08/2012   Hematuria 07/08/2012   Mixed hyperlipidemia 07/08/2012   Nephrolithiasis 07/08/2012   CAD (coronary artery disease), native coronary artery 07/08/2012  Type II diabetes mellitus (HCC) 07/08/2012    Operations: Procedure(s): XI Robotic  Assisted Incisional Hernia Repair With Mesh Bilateral retrorectus myofascial  release Bilateral transversus abdominis release  Admission Condition: good  Discharged Condition: good  Indication for Admission: Ventral hernia with incarcerated colon  Hospital Course: Ms. Beverly [name] presented, underwent surgery as above and was discharged.  Due to the 6-7 hours of anesthesia time, she was watched overnight in the ICU. She did well and was quickly transferred to the floor, then discharged.  Consults:  ICU for monitoring  Significant Diagnostic Studies: None  Treatments: surgery: as above  Disposition: Home  Patient Instructions:  Allergies as of 11/27/2021       Reactions   Shellfish Allergy Diarrhea   Shellfish-derived Products Diarrhea   Tape Other (See Comments)   Can blister the skin        Medication List     TAKE these medications    acetaminophen 500 MG tablet Commonly known as: TYLENOL Take 1,000 mg by mouth every 8 (eight) hours as needed for moderate pain.   ALPRAZolam 0.5 MG tablet Commonly known as: XANAX Take 0.5 mg by mouth 2 (two) times daily as needed for anxiety.   aluminum-magnesium hydroxide-simethicone 200-200-20 MG/5ML Susp Commonly known as: MAALOX Take 30 mLs by mouth 3 (three) times daily as needed (for gas/indigestion).   aspirin EC 81 MG tablet Take 1 tablet (81 mg total) by mouth daily.   atorvastatin 80 MG tablet Commonly known as: LIPITOR 1 TABLET BY MOUTH DAILY AT 6 PM What changed: See the new instructions.   carvedilol 12.5 MG tablet Commonly known as: COREG Take 12.5 mg by mouth in the morning.   Farxiga 10 MG Tabs tablet Generic drug: dapagliflozin propanediol Take 10 mg by mouth daily.   gabapentin 300 MG capsule Commonly known as: NEURONTIN Take 1 capsule (300 mg total) by mouth 3 (three) times daily.   ibuprofen 200 MG tablet Commonly known as: ADVIL Take 400 mg by mouth every 8 (eight) hours as needed for moderate pain.   insulin lispro 100 UNIT/ML KwikPen Commonly known as: HumaLOG  KwikPen Inject 0.12 mLs (12 Units total) into the skin 3 (three) times daily. What changed: how much to take   Lantus SoloStar 100 UNIT/ML Solostar Pen Generic drug: insulin glargine Inject 50 Units into the skin daily. What changed:  how much to take when to take this   nitroGLYCERIN 0.4 MG SL tablet Commonly known as: NITROSTAT Place 1 tablet (0.4 mg total) under the tongue every 5 (five) minutes as needed for chest pain.   olmesartan 40 MG tablet Commonly known as: BENICAR Take 40 mg by mouth daily.   One-A-Day Womens Formula Tabs Take 1 tablet by mouth daily with breakfast. Unknown strength   oxyCODONE 5 MG immediate release tablet Commonly known as: Oxy IR/ROXICODONE Take 1 tablet (5 mg total) by mouth every 6 (six) hours as needed for moderate pain.   simethicone 125 MG chewable tablet Commonly known as: MYLICON Chew 125 mg by mouth every 6 (six) hours as needed for flatulence.   Trulicity 1.5 MG/0.5ML Sopn Generic drug: Dulaglutide Inject 1.5 mg into the skin every Tuesday.   Vitamin D3 125 MCG (5000 UT) Caps Take 5,000 Units by mouth daily.   VITAMIN K2 PO Take 1 tablet by mouth daily.        Activity: activity as tolerated and no heavy lifting for 4 weeks Diet: regular diet Wound Care: keep wound clean and dry  Follow-up:  With Dr. Dossie Der in 4 weeks.  Signed: Hyman Hopes Tripp [facility] Bariatric, [facility] Minimally Invasive Surgery [facility], [place]   11/29/2021, 7:37 AM

## 2022-02-13 ENCOUNTER — Ambulatory Visit: Payer: Managed Care, Other (non HMO) | Admitting: Internal Medicine

## 2022-03-15 ENCOUNTER — Ambulatory Visit: Payer: Commercial Managed Care - HMO | Attending: Cardiology | Admitting: Cardiology

## 2022-03-15 ENCOUNTER — Encounter: Payer: Self-pay | Admitting: Cardiology

## 2022-03-15 VITALS — BP 120/70 | HR 69 | Ht 60.0 in | Wt 260.0 lb

## 2022-03-15 DIAGNOSIS — E785 Hyperlipidemia, unspecified: Secondary | ICD-10-CM | POA: Diagnosis not present

## 2022-03-15 DIAGNOSIS — Z794 Long term (current) use of insulin: Secondary | ICD-10-CM

## 2022-03-15 DIAGNOSIS — Z951 Presence of aortocoronary bypass graft: Secondary | ICD-10-CM | POA: Diagnosis not present

## 2022-03-15 DIAGNOSIS — E1142 Type 2 diabetes mellitus with diabetic polyneuropathy: Secondary | ICD-10-CM | POA: Diagnosis not present

## 2022-03-15 DIAGNOSIS — I25118 Atherosclerotic heart disease of native coronary artery with other forms of angina pectoris: Secondary | ICD-10-CM | POA: Diagnosis not present

## 2022-03-15 MED ORDER — ISOSORBIDE MONONITRATE ER 30 MG PO TB24: 30.0000 mg | ORAL_TABLET | Freq: Every day | ORAL | 3 refills | Status: DC

## 2022-03-15 NOTE — Patient Instructions (Signed)
Medication Instructions:   START: Imdur 30mg  1 tablet daily   Lab Work: None Ordered If you have labs (blood work) drawn today and your tests are completely normal, you will receive your results only by: MyChart Message (if you have MyChart) OR A paper copy in the mail If you have any lab test that is abnormal or we need to change your treatment, we will call you to review the results.   Testing/Procedures: None Ordered   Follow-Up: At [facility], you and your health needs are our priority.  As part of our continuing mission to provide you with exceptional heart care, we have created designated Provider Care Teams.  These Care Teams include your primary Cardiologist (physician) and Advanced Practice Providers (APPs -  Physician Assistants and Nurse Practitioners) who all work together to provide you with the care you need, when you need it.  We recommend signing up for the patient portal called "MyChart".  Sign up information is provided on this After Visit Summary.  MyChart is used to connect with patients for Virtual Visits (Telemedicine).  Patients are able to view lab/test results, encounter notes, upcoming appointments, etc.  Non-urgent messages can be sent to your provider as well.   To learn more about what you can do with MyChart, go to [facility].    Your next appointment:   1 month(s)  The format for your next appointment:   In Person  Provider:   [email or web address], MD    Other Instructions NA

## 2022-03-15 NOTE — Progress Notes (Unsigned)
Cardiology Office Note:    Date:  03/15/2022   ID:  Beverly [name], DOB [birth year], MRN [id number]  PCP:  [name], MD  Cardiologist:  [name], MD    Referring MD: [name], MD   Chief Complaint  Patient presents with   Follow-up    History of Present Illness:    Beverly [name] is a 46 y.o. female   past medical history significant for poorly controlled diabetes, essential hypertension, dyslipidemia, morbid obesity, status post gastric bypass surgery years ago, status post recent coronary artery bypass graft.  She also had history of stenting done in 2009.  After that no follow-up.  She came to hospital because of intermittent chest pain cardiac catheterization has been done and then she ended up having coronary artery bypass graft with LIMA to LAD SVG to ramus intermediate, SVG to PDA that was done on June 19, 2019.  Recovery was complicated by DVT. In the meantime she ended up being in the emergency room look like she did have pancreatitis.  She was also told to have multiple hernias that required surgery at the same time to talk about potentially revising her gastric bypass surgery. Since I seen her last time she end up having abdominal surgery for her hernia.  Apparently surgery was most lengthy and extensive than anticipated but did overall quite okay end up going to the emergency room yesterday because of chest pain.  She says she gets chest pain on and off does not happen with exercise.  She takes nitroglycerin with some relief.  Biochemical markers were negative in the emergency room.  Since that time she is doing well  Past Medical History:  Diagnosis Date   CAD (coronary artery disease) 06/16/2019   CAD (coronary artery disease), native coronary artery 07/08/2012   Cellulitis 06/14/2020   Chest pain 06/16/2019   Diabetes mellitus (HCC) 09/23/2019   Diabetes mellitus without complication (HCC)    Dyslipidemia 09/23/2019   Encounter for long-term (current)  use of insulin (HCC) 07/08/2012   Gastro-esophageal reflux disease without esophagitis 06/14/2020   Hematuria 07/08/2012   History of DVT (deep vein thrombosis)    History of kidney stones    Hyperglycemia due to type 2 diabetes mellitus (HCC) 06/14/2020   Hyperlipidemia 07/08/2012   Hypertension    Hypokalemia 06/17/2019   Hypothyroidism 06/14/2020   Idiopathic peripheral autonomic neuropathy 06/14/2020   Insulin-requiring or dependent type II diabetes mellitus (HCC)    Multiple thyroid nodules    Obesity    Old myocardial infarction 06/14/2020   Peripheral vascular disease (HCC)    Positive D dimer    Primary generalized (osteo)arthritis 06/14/2020   S/P bariatric surgery 02/19/2014   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   Sleep apnea    no cpap   Type 2 diabetes mellitus with diabetic polyneuropathy, with long-term current use of insulin (HCC) 09/23/2019   Type 2 diabetes mellitus with hyperglycemia, with long-term current use of insulin (HCC) 09/23/2019   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, with long-term current use of insulin (HCC) 09/23/2019   Type II diabetes mellitus (HCC) 07/08/2012   Vitamin D deficiency 06/14/2020    Past Surgical History:  Procedure Laterality Date   APPLICATION OF WOUND VAC N/A 06/19/2019   Procedure: Application Of Wound Vac USING PREVENA INCISIONAL DRESSING;  Surgeon: [name], MD;  Location: [facility] OR;  Service: Open Heart Surgery;  Laterality: N/A;   Cardiac Stent  2009   CORONARY  ARTERY BYPASS GRAFT N/A 06/19/2019   Procedure: CORONARY ARTERY BYPASS GRAFTING (CABG) x 3 WITH ENDOSCOPIC HARVESTING OF LEFT GREATER SAPHENOUS VEIN. LIMA TO LAD, SVG TO PD, SVG TO RAMUS;  Surgeon: [name], MD;  Location: [facility] OR;  Service: Open Heart Surgery;  Laterality: N/A;   LEFT HEART CATH AND CORONARY ANGIOGRAPHY N/A 06/18/2019   Procedure: LEFT HEART CATH AND CORONARY ANGIOGRAPHY;  Surgeon: [name], MD;  Location: [facility] INVASIVE  CV LAB;  Service: Cardiovascular;  Laterality: N/A;   TEE WITHOUT CARDIOVERSION N/A 06/19/2019   Procedure: TRANSESOPHAGEAL ECHOCARDIOGRAM (TEE);  Surgeon: [name], MD;  Location: [facility] OR;  Service: Open Heart Surgery;  Laterality: N/A;   XI ROBOTIC ASSISTED VENTRAL HERNIA N/A 11/25/2021   Procedure: XI Robotic  Assisted Incisional Hernia Repair With Mesh Bilateral retrorectus myofascial release Bilateral transversus abdominis release;  Surgeon: Quentin Ore, MD;  Location: [facility] ORS;  Service: General;  Laterality: N/A;    Current Medications: Current Meds  Medication Sig   acetaminophen (TYLENOL) 500 MG tablet Take 1,000 mg by mouth every 8 (eight) hours as needed for moderate pain.   ALPRAZolam (XANAX) 0.5 MG tablet Take 0.5 mg by mouth 2 (two) times daily as needed for anxiety.   aluminum-magnesium hydroxide-simethicone (MAALOX) 200-200-20 MG/5ML SUSP Take 30 mLs by mouth 3 (three) times daily as needed (for gas/indigestion).    aspirin EC 81 MG tablet Take 1 tablet (81 mg total) by mouth daily.   atorvastatin (LIPITOR) 80 MG tablet 1 TABLET BY MOUTH DAILY AT 6 PM   carvedilol (COREG) 12.5 MG tablet Take 12.5 mg by mouth in the morning.    Cholecalciferol (VITAMIN D3) 125 MCG (5000 UT) CAPS Take 5,000 Units by mouth daily.   dapagliflozin propanediol (FARXIGA) 10 MG TABS tablet Take 10 mg by mouth daily.   gabapentin (NEURONTIN) 300 MG capsule Take 1 capsule (300 mg total) by mouth 3 (three) times daily.   ibuprofen (ADVIL) 200 MG tablet Take 400 mg by mouth every 8 (eight) hours as needed for moderate pain.   insulin glargine (LANTUS SOLOSTAR) 100 UNIT/ML Solostar Pen Inject 50 Units into the skin daily. (Patient taking differently: Inject 55 Units into the skin at bedtime.)   insulin lispro (HUMALOG KWIKPEN) 100 UNIT/ML KwikPen Inject 0.12 mLs (12 Units total) into the skin 3 (three) times daily. (Patient taking differently: Inject 16 Units into the skin 3 (three) times  daily.)   Menaquinone-7 (VITAMIN K2 PO) Take 1 tablet by mouth daily.   Multiple Vitamins-Calcium (ONE-A-DAY WOMENS FORMULA) TABS Take 1 tablet by mouth daily with breakfast. Unknown strength   nitroGLYCERIN (NITROSTAT) 0.4 MG SL tablet Place 1 tablet (0.4 mg total) under the tongue every 5 (five) minutes as needed for chest pain.   olmesartan (BENICAR) 40 MG tablet Take 40 mg by mouth daily.   simethicone (MYLICON) 125 MG chewable tablet Chew 125 mg by mouth every 6 (six) hours as needed for flatulence.   TRULICITY 4.5 MG/0.5ML SOPN Inject 4.5 mg into the skin once a week.   [DISCONTINUED] Dulaglutide (TRULICITY) 1.5 MG/0.5ML SOPN Inject 1.5 mg into the skin every Tuesday.     Allergies:   Shellfish allergy, Shellfish-derived products, and Tape   Social History   Socioeconomic History   Marital status: Married    Spouse name: Not on file   Number of children: Not on file   Years of education: Not on file   Highest education level: Not on file  Occupational  History   Not on file  Tobacco Use   Smoking status: Never   Smokeless tobacco: Never  Vaping Use   Vaping Use: Never used  Substance and Sexual Activity   Alcohol use: Never   Drug use: Never   Sexual activity: Yes  Other Topics Concern   Not on file  Social History Narrative   Not on file   Social Determinants of Health   Financial Resource Strain: Not on file  Food Insecurity: Not on file  Transportation Needs: Not on file  Physical Activity: Not on file  Stress: Not on file  Social Connections: Not on file     Family History: The patient's family history includes Diabetes Mellitus II in her father and mother; Heart disease in her father and maternal grandmother; Hypertension in her father and mother. ROS:   Please see the history of present illness.    All 14 point review of systems negative except as described per history of present illness  EKGs/Labs/Other Studies Reviewed:      Recent Labs: 11/27/2021:  BUN 19; Creatinine, Ser 0.64; Hemoglobin 12.2; Platelets 228; Potassium 3.6; Sodium 139  Recent Lipid Panel    Component Value Date/Time   CHOL 139 08/07/2019 0932   TRIG 82 08/07/2019 0932   HDL 50 08/07/2019 0932   CHOLHDL 2.8 08/07/2019 0932   CHOLHDL 6.1 06/17/2019 0500   VLDL 23 06/17/2019 0500   LDLCALC 73 08/07/2019 0932    Physical Exam:    VS:  BP 120/70 (BP Location: Right Arm, Patient Position: Sitting, Cuff Size: Normal)   Pulse 69   Ht 5' (1.524 m)   Wt 260 lb (117.9 kg)   SpO2 98%   BMI 50.78 kg/m     Wt Readings from Last 3 Encounters:  03/15/22 260 lb (117.9 kg)  11/25/21 266 lb 1.5 oz (120.7 kg)  11/18/21 249 lb (112.9 kg)     GEN:  Well nourished, well developed in no acute distress HEENT: Normal NECK: No JVD; No carotid bruits LYMPHATICS: No lymphadenopathy CARDIAC: RRR, no murmurs, no rubs, no gallops RESPIRATORY:  Clear to auscultation without rales, wheezing or rhonchi  ABDOMEN: Soft, non-tender, non-distended MUSCULOSKELETAL:  No edema; No deformity  SKIN: Warm and dry LOWER EXTREMITIES: no swelling NEUROLOGIC:  Alert and oriented x 3 PSYCHIATRIC:  Normal affect   ASSESSMENT:    1. S/P CABG x 3   2. Dyslipidemia   3. Type 2 diabetes mellitus with diabetic polyneuropathy, with long-term current use of insulin (HCC)   4. Coronary artery disease of native artery of native heart with stable angina pectoris (HCC)    PLAN:    In order of problems listed above:  Coronary artery disease status post coronary bypass graft, last test that shows small area of ischemia.  She does have some symptoms.  I will put her on Imdur 30 daily ask her to take nitroglycerin on as needed continue antiplatelets therapy see her back in a month see how she does hopefully will be able to maximize her medical therapy and by doing this diminish her symptoms. Dyslipidemia she tells me her last cholesterol was bad, she is on high intensity statin Lipitor 80, I will call  primary care physician to see copy of her cholesterol to see if anything is to be adjusted. Diabetes mellitus still poorly controlled.  I see last hemoglobin A1c which is 8.6 this is from September 2023.  Unacceptably high.  I told her again and again that she  needs to take care of her diabetes the best she can she does have 1 would not her endocrinologist in May I asked her to get sooner appointment   Medication Adjustments/Labs and Tests Ordered: Current medicines are reviewed at length with the patient today.  Concerns regarding medicines are outlined above.  No orders of the defined types were placed in this encounter.  Medication changes: No orders of the defined types were placed in this encounter.   Signed, Georgeanna Lea, MD, [facility] 03/15/2022 12:58 PM    [facility]

## 2022-03-23 ENCOUNTER — Encounter: Payer: Self-pay | Admitting: Cardiology

## 2022-03-23 ENCOUNTER — Telehealth: Payer: Self-pay | Admitting: Cardiology

## 2022-03-23 ENCOUNTER — Other Ambulatory Visit: Payer: Self-pay

## 2022-03-23 NOTE — Telephone Encounter (Signed)
Pt c/o medication issue:  1. Name of Medication:   isosorbide mononitrate (IMDUR) 30 MG 24 hr tablet   2. How are you currently taking this medication (dosage and times per day)? As prescribed  3. Are you having a reaction (difficulty breathing--STAT)?   No  4. What is your medication issue?   Patient stated she is having nausea, sweating, jaw tightness, anxiety and a burning in her chest while on this medication. Patient wants alternate medication.

## 2022-03-23 NOTE — Telephone Encounter (Signed)
Called patient and she reported having the following symptoms after starting to take Imdur:  "she is having nausea, sweating, jaw tightness, anxiety and a burning in her chest while on this medication"  I spoke to Dr. Bing Matter regarding her symptoms and he recommended that she go to the ER. I relayed this information to the patient and she agreed and stated that she would go to the ER.

## 2022-03-29 ENCOUNTER — Telehealth: Payer: Self-pay | Admitting: Cardiology

## 2022-03-29 NOTE — Telephone Encounter (Signed)
  Pt c/o medication issue:  1. Name of Medication:   isosorbide mononitrate (IMDUR) 30 MG 24 hr tablet    2. How are you currently taking this medication (dosage and times per day)?   Take 1 tablet (30 mg total) by mouth daily.    3. Are you having a reaction (difficulty breathing--STAT)? No   4. What is your medication issue? Pt said, she is having a reaction to the medication. She feels, Headache, nausea and anxiety

## 2022-03-30 NOTE — Telephone Encounter (Signed)
Spoke with pt per Dr. Wendy Poet note advised to stop Imdur and discuss at upcoming appt.

## 2022-04-18 ENCOUNTER — Encounter: Payer: Self-pay | Admitting: Cardiology

## 2022-04-18 ENCOUNTER — Ambulatory Visit: Payer: BLUE CROSS/BLUE SHIELD | Attending: Cardiology | Admitting: Cardiology

## 2022-04-18 VITALS — BP 128/60 | HR 76 | Ht 60.0 in | Wt 260.8 lb

## 2022-04-18 DIAGNOSIS — I200 Unstable angina: Secondary | ICD-10-CM | POA: Diagnosis not present

## 2022-04-18 DIAGNOSIS — Z951 Presence of aortocoronary bypass graft: Secondary | ICD-10-CM

## 2022-04-18 DIAGNOSIS — E1142 Type 2 diabetes mellitus with diabetic polyneuropathy: Secondary | ICD-10-CM

## 2022-04-18 DIAGNOSIS — E782 Mixed hyperlipidemia: Secondary | ICD-10-CM

## 2022-04-18 DIAGNOSIS — Z794 Long term (current) use of insulin: Secondary | ICD-10-CM

## 2022-04-18 DIAGNOSIS — I25118 Atherosclerotic heart disease of native coronary artery with other forms of angina pectoris: Secondary | ICD-10-CM

## 2022-04-18 MED ORDER — RANOLAZINE ER 500 MG PO TB12: 500.0000 mg | ORAL_TABLET | Freq: Two times a day (BID) | ORAL | 3 refills | Status: AC

## 2022-04-18 NOTE — Progress Notes (Signed)
Cardiology Office Note:    Date:  04/18/2022   ID:  SUE FERNICOLA, DOB 1975-07-24, MRN [id number]  PCP:  [name], MD  Cardiologist:  [name], MD    Referring MD: [name], MD   Chief Complaint  Patient presents with   Medication Management    History of Present Illness:    Beverly [name] is a 47 y.o. female     past medical history significant for poorly controlled diabetes, essential hypertension, dyslipidemia, morbid obesity, status post gastric bypass surgery years ago, status post recent coronary artery bypass graft.  She also had history of stenting done in 2009.  After that no follow-up.  She came to hospital because of intermittent chest pain cardiac catheterization has been done and then she ended up having coronary artery bypass graft with LIMA to LAD SVG to ramus intermediate, SVG to PDA that was done on June 19, 2019.  Recovery was complicated by DVT.  Comes today to months for follow-up last time I seen her she did have atypical chest pain.  Since that time I am seeing her she end up going to the emergency room I did review her visit from the emergency room biochemical markers were negative for overall suspicion for coronary event was low she is still complain of having some chest pain pain is however worse with pressing the chest wall I did give her Imdur she initially told me that it helped but then she felt miserable with this so she discontinued.  She still talk about potentially having some gastric bypass surgery.  There was also some complaint about mesh that she got implant in her belly because of hernia getting loose.  Past Medical History:  Diagnosis Date   CAD (coronary artery disease) 06/16/2019   CAD (coronary artery disease), native coronary artery 07/08/2012   Cellulitis 06/14/2020   Chest pain 06/16/2019   Diabetes mellitus (HCC) 09/23/2019   Diabetes mellitus without complication (HCC)    Dyslipidemia 09/23/2019   Encounter for  long-term (current) use of insulin (HCC) 07/08/2012   Gastro-esophageal reflux disease without esophagitis 06/14/2020   Hematuria 07/08/2012   History of DVT (deep vein thrombosis)    History of kidney stones    Hyperglycemia due to type 2 diabetes mellitus (HCC) 06/14/2020   Hyperlipidemia 07/08/2012   Hypertension    Hypokalemia 06/17/2019   Hypothyroidism 06/14/2020   Idiopathic peripheral autonomic neuropathy 06/14/2020   Insulin-requiring or dependent type II diabetes mellitus (HCC)    Multiple thyroid nodules    Obesity    Old myocardial infarction 06/14/2020   Peripheral vascular disease (HCC)    Positive D dimer    Primary generalized (osteo)arthritis 06/14/2020   S/P bariatric surgery 02/19/2014   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   Sleep apnea    no cpap   Type 2 diabetes mellitus with diabetic polyneuropathy, with long-term current use of insulin (HCC) 09/23/2019   Type 2 diabetes mellitus with hyperglycemia, with long-term current use of insulin (HCC) 09/23/2019   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 LIMA TO LAD, SVG TO PD, SVG TO RAMUS;  Surgeon: Melrose Nakayama, MD;  Location: [place];  Service: Open Heart Surgery;  Laterality: N/A;   LEFT HEART CATH AND CORONARY ANGIOGRAPHY N/A 06/18/2019   Procedure: LEFT HEART CATH AND CORONARY ANGIOGRAPHY;  Surgeon: Leonie Man, MD;   Location: [place] CV LAB;  Service: Cardiovascular;  Laterality: N/A;   TEE WITHOUT CARDIOVERSION N/A 06/19/2019   Procedure: TRANSESOPHAGEAL ECHOCARDIOGRAM (TEE);  Surgeon: Melrose Nakayama, MD;  Location: [place];  Service: Open Heart Surgery;  Laterality: N/A;   XI ROBOTIC ASSISTED VENTRAL HERNIA N/A 11/25/2021   Procedure: XI Robotic  Assisted Incisional Hernia Repair With Mesh Bilateral retrorectus myofascial release Bilateral transversus abdominis release;  Surgeon: Felicie Morn, MD;  Location: [facility] ORS;  Service: General;  Laterality: N/A;    Current Medications: Current Meds  Medication Sig   acetaminophen (TYLENOL) 500 MG tablet Take 1,000 mg by mouth every 8 (eight) hours as needed for moderate pain.   ALPRAZolam (XANAX) 0.5 MG tablet Take 0.5 mg by mouth 2 (two) times daily as needed for anxiety.   aluminum-magnesium hydroxide-simethicone (MAALOX) I037812 MG/5ML SUSP Take 30 mLs by mouth 3 (three) times daily as needed (for gas/indigestion).    aspirin EC 81 MG tablet Take 1 tablet (81 mg total) by mouth daily.   atorvastatin (LIPITOR) 80 MG tablet 1 TABLET BY MOUTH DAILY AT 6 PM (Patient taking differently: Take 80 mg by mouth daily.)   carvedilol (COREG) 12.5 MG tablet Take 12.5 mg by mouth in the morning.    Cholecalciferol (VITAMIN D3) 125 MCG (5000 UT) CAPS Take 5,000 Units by mouth daily.   dapagliflozin propanediol (FARXIGA) 10 MG TABS tablet Take 10 mg by mouth daily.   gabapentin (NEURONTIN) 300 MG capsule Take 1 capsule (300 mg total) by mouth 3 (three) times daily.   ibuprofen (ADVIL) 200 MG tablet Take 400 mg by mouth every 8 (eight) hours as needed for moderate pain.   insulin glargine (LANTUS SOLOSTAR) 100 UNIT/ML Solostar Pen Inject 50 Units into the skin daily. (Patient taking differently: Inject 55 Units into the skin at bedtime.)   insulin lispro (HUMALOG KWIKPEN) 100 UNIT/ML KwikPen Inject 0.12 mLs (12 Units total) into the skin 3 (three) times daily.  (Patient taking differently: Inject 16 Units into the skin 3 (three) times daily.)   Menaquinone-7 (VITAMIN K2 PO) Take 1 tablet by mouth daily.   Multiple Vitamins-Calcium (ONE-A-DAY WOMENS FORMULA) TABS Take 1 tablet by mouth daily with breakfast. Unknown strength   nitroGLYCERIN (NITROSTAT) 0.4 MG SL tablet Place 1 tablet (0.4 mg total) under the tongue every 5 (five) minutes as needed for chest pain.   olmesartan (BENICAR) 40 MG tablet Take 40 mg by mouth daily.   simethicone (MYLICON) 0000000 MG chewable tablet Chew 125 mg by mouth every 6 (six) hours as needed for flatulence.   TRULICITY 4.5 0000000 SOPN Inject 4.5 mg into the skin once a week.     Allergies:   Shellfish allergy, Shellfish-derived products, and Tape   Social History   Socioeconomic History   Marital status: Married    Spouse name: Not on file   Number of children: Not on file   Years of education: Not on file   Highest education level: Not on file  Occupational History  Not on file  Tobacco Use   Smoking status: Never   Smokeless tobacco: Never  Vaping Use   Vaping Use: Never used  Substance and Sexual Activity   Alcohol use: Never   Drug use: Never   Sexual activity: Yes  Other Topics Concern   Not on file  Social History Narrative   Not on file   Social Determinants of Health   Financial Resource Strain: Not on file  Food Insecurity: Not on file  Transportation Needs: Not on file  Physical Activity: Not on file  Stress: Not on file  Social Connections: Not on file     Family History: The patient's family history includes Diabetes Mellitus II in her father and mother; Heart disease in her father and maternal grandmother; Hypertension in her father and mother. ROS:   Please see the history of present illness.    All 14 point review of systems negative except as described per history of present illness  EKGs/Labs/Other Studies Reviewed:      Recent Labs: 11/27/2021: BUN 19; Creatinine, Ser  0.64; Hemoglobin 12.2; Platelets 228; Potassium 3.6; Sodium 139  Recent Lipid Panel    Component Value Date/Time   CHOL 139 08/07/2019 0932   TRIG 82 08/07/2019 0932   HDL 50 08/07/2019 0932   CHOLHDL 2.8 08/07/2019 0932   CHOLHDL 6.1 06/17/2019 0500   VLDL 23 06/17/2019 0500   LDLCALC 73 08/07/2019 0932    Physical Exam:    VS:  BP 128/60 (BP Location: Left Arm, Patient Position: Sitting)   Pulse 76   Ht 5' (1.524 m)   Wt 260 lb 12.8 oz (118.3 kg)   SpO2 99%   BMI 50.93 kg/m     Wt Readings from Last 3 Encounters:  04/18/22 260 lb 12.8 oz (118.3 kg)  03/15/22 260 lb (117.9 kg)  11/25/21 266 lb 1.5 oz (120.7 kg)     GEN:  Well nourished, well developed in no acute distress HEENT: Normal NECK: No JVD; No carotid bruits LYMPHATICS: No lymphadenopathy CARDIAC: RRR, no murmurs, no rubs, no gallops RESPIRATORY:  Clear to auscultation without rales, wheezing or rhonchi  ABDOMEN: Soft, non-tender, non-distended MUSCULOSKELETAL:  No edema; No deformity  SKIN: Warm and dry LOWER EXTREMITIES: no swelling NEUROLOGIC:  Alert and oriented x 3 PSYCHIATRIC:  Normal affect   ASSESSMENT:    1. Coronary artery disease of native artery of native heart with stable angina pectoris ([place])   2. Type 2 diabetes mellitus with diabetic polyneuropathy, with long-term current use of insulin ([place])   3. Unstable angina pectoris ([place])   4. Mixed hyperlipidemia   5. S/P CABG x 3    PLAN:    In order of problems listed above:  Coronary disease.  She does have a stress test from summer which showed minimal abnormality.  I try to manage this medically, I tried Imdur without success, I will give her ranolazine 500 mg twice daily. Atypical chest pain.  Does not look like cardiac however with her past medical history we need to be very careful I see him back in my office in about 2 months see how she does. Mixed dyslipidemia she is taking high intense statin for of Lipitor.  I did review K PN which  show LDL 73 HDL 50.  Will continue present management. Diabetes I do have her hemoglobin A1c from September which was 8.6.  Still some room for improvement clearly.   Medication Adjustments/Labs and Tests Ordered: Current medicines are reviewed  at length with the patient today.  Concerns regarding medicines are outlined above.  No orders of the defined types were placed in this encounter.  Medication changes: No orders of the defined types were placed in this encounter.   Signed, Park Liter, MD, [facility] 04/18/2022 1:28 PM     [facility]

## 2022-04-18 NOTE — Addendum Note (Signed)
Addended by: Jacobo Forest D on: 04/18/2022 01:44 PM   Modules accepted: Orders

## 2022-04-18 NOTE — Patient Instructions (Signed)
Medication Instructions:   START: Ranolazine 500mg  1 tablet twice daily   Lab Work: None Ordered If you have labs (blood work) drawn today and your tests are completely normal, you will receive your results only by: MyChart Message (if you have MyChart) OR A paper copy in the mail If you have any lab test that is abnormal or we need to change your treatment, we will call you to review the results.   Testing/Procedures: None Ordered   Follow-Up: At [facility], you and your health needs are our priority.  As part of our continuing mission to provide you with exceptional heart care, we have created designated Provider Care Teams.  These Care Teams include your primary Cardiologist (physician) and Advanced Practice Providers (APPs -  Physician Assistants and Nurse Practitioners) who all work together to provide you with the care you need, when you need it.  We recommend signing up for the patient portal called "MyChart".  Sign up information is provided on this After Visit Summary.  MyChart is used to connect with patients for Virtual Visits (Telemedicine).  Patients are able to view lab/test results, encounter notes, upcoming appointments, etc.  Non-urgent messages can be sent to your provider as well.   To learn more about what you can do with MyChart, go to [email or web address].    Your next appointment:   2 month(s)  The format for your next appointment:   In Person  Provider:   Jenne Campus, MD    Other Instructions NA

## 2022-05-02 ENCOUNTER — Other Ambulatory Visit: Payer: Self-pay | Admitting: Surgery

## 2022-05-02 DIAGNOSIS — R1012 Left upper quadrant pain: Secondary | ICD-10-CM

## 2022-05-11 ENCOUNTER — Ambulatory Visit
Admission: RE | Admit: 2022-05-11 | Discharge: 2022-05-11 | Disposition: A | Discharge status: Home / Self Care | Payer: Commercial Managed Care - HMO | Source: Ambulatory Visit | Attending: Surgery | Admitting: Surgery

## 2022-05-11 DIAGNOSIS — R1012 Left upper quadrant pain: Secondary | ICD-10-CM

## 2022-05-11 MED ORDER — IOPAMIDOL (ISOVUE-300) INJECTION 61%
125.0000 mL | Freq: Once | INTRAVENOUS | Status: AC | PRN
  Administered 2022-05-11: 125 mL via INTRAVENOUS

## 2022-06-20 ENCOUNTER — Ambulatory Visit: Payer: Commercial Managed Care - HMO | Attending: Cardiology | Admitting: Cardiology

## 2022-06-20 ENCOUNTER — Encounter: Payer: Self-pay | Admitting: Cardiology

## 2022-06-20 VITALS — BP 122/70 | HR 80 | Ht 60.0 in | Wt 258.2 lb

## 2022-06-20 DIAGNOSIS — E782 Mixed hyperlipidemia: Secondary | ICD-10-CM | POA: Diagnosis not present

## 2022-06-20 DIAGNOSIS — Z9884 Bariatric surgery status: Secondary | ICD-10-CM | POA: Diagnosis not present

## 2022-06-20 DIAGNOSIS — Z951 Presence of aortocoronary bypass graft: Secondary | ICD-10-CM

## 2022-06-20 DIAGNOSIS — Z8719 Personal history of other diseases of the digestive system: Secondary | ICD-10-CM

## 2022-06-20 DIAGNOSIS — Z794 Long term (current) use of insulin: Secondary | ICD-10-CM

## 2022-06-20 DIAGNOSIS — Z9889 Other specified postprocedural states: Secondary | ICD-10-CM

## 2022-06-20 DIAGNOSIS — E1165 Type 2 diabetes mellitus with hyperglycemia: Secondary | ICD-10-CM

## 2022-06-20 NOTE — Patient Instructions (Signed)

## 2022-06-20 NOTE — Progress Notes (Signed)
Cardiology Office Note:    Date:  06/20/2022   ID:  Beverly [name], DOB 03/14/1976, MRN [id number]  PCP:  Bonnita Nasuti, MD  Cardiologist:  Jenne Campus, MD    Referring MD: Bonnita Nasuti, MD   No chief complaint on file. Doing fine  History of Present Illness:    Beverly [name] is a 47 y.o. female   past medical history significant for poorly controlled diabetes, essential hypertension, dyslipidemia, morbid obesity, status post gastric bypass surgery years ago, status post recent coronary artery bypass graft.  She also had history of stenting done in 2009.  After that no follow-up.  She came to hospital because of intermittent chest pain cardiac catheterization has been done and then she ended up having coronary artery bypass graft with LIMA to LAD SVG to ramus intermediate, SVG to PDA that was done on June 19, 2019.  Recovery was complicated by DVT.  Comes today to months for follow-up she states she is feeling a little better.  Last episode of chest pain hide diabetes seems to be better controlled.  She did see surgeon regarding her mesh that she got implanted in her belly for a hernia repair up on the CT has been done seems to be fine she still complaining of some belly aches.  Past Medical History:  Diagnosis Date   CAD (coronary artery disease) 06/16/2019   CAD (coronary artery disease), native coronary artery 07/08/2012   Cellulitis 06/14/2020   Chest pain 06/16/2019   Diabetes mellitus ([place]) 09/23/2019   Diabetes mellitus without complication ([place])    Dyslipidemia 09/23/2019   Encounter for long-term (current) use of insulin ([place]) 07/08/2012   Gastro-esophageal reflux disease without esophagitis 06/14/2020   Hematuria 07/08/2012   History of DVT (deep vein thrombosis)    History of kidney stones    Hyperglycemia due to type 2 diabetes mellitus ([place]) 06/14/2020   Hyperlipidemia 07/08/2012   Hypertension    Hypokalemia 06/17/2019   Hypothyroidism 06/14/2020    Idiopathic peripheral autonomic neuropathy 06/14/2020   Insulin-requiring or dependent type II diabetes mellitus ([place])    Multiple thyroid nodules    Obesity    Old myocardial infarction 06/14/2020   Peripheral vascular disease ([place])    Positive D dimer    Primary generalized (osteo)arthritis 06/14/2020   S/P bariatric surgery 02/19/2014   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   Sleep apnea    no cpap   Type 2 diabetes mellitus with diabetic polyneuropathy, with long-term current use of insulin ([place]) 09/23/2019   Type 2 diabetes mellitus with hyperglycemia, with long-term current use of insulin ([place]) 09/23/2019   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, with long-term current use of insulin ([place]) 09/23/2019   Type II diabetes mellitus ([place]) 07/08/2012   Vitamin D deficiency 06/14/2020    Past Surgical History:  Procedure Laterality Date   APPLICATION OF WOUND VAC N/A 06/19/2019   Procedure: Application Of Wound Vac USING PREVENA INCISIONAL DRESSING;  Surgeon: Melrose Nakayama, MD;  Location: [place];  Service: Open Heart Surgery;  Laterality: N/A;   Cardiac Stent  2009   CORONARY ARTERY BYPASS GRAFT N/A 06/19/2019   Procedure: CORONARY ARTERY BYPASS GRAFTING (CABG) x 3 WITH ENDOSCOPIC HARVESTING OF LEFT GREATER SAPHENOUS VEIN. LIMA TO LAD, SVG TO PD, SVG TO RAMUS;  Surgeon: Melrose Nakayama, MD;  Location: [place];  Service: Open Heart Surgery;  Laterality: N/A;   LEFT HEART CATH AND CORONARY ANGIOGRAPHY N/A 06/18/2019  Procedure: LEFT HEART CATH AND CORONARY ANGIOGRAPHY;  Surgeon: Leonie Man, MD;  Location: [place] [facility];  Service: Cardiovascular;  Laterality: N/A;   TEE WITHOUT CARDIOVERSION N/A 06/19/2019   Procedure: TRANSESOPHAGEAL ECHOCARDIOGRAM (TEE);  Surgeon: Melrose Nakayama, MD;  Location: [place];  Service: Open Heart Surgery;  Laterality: N/A;   XI ROBOTIC ASSISTED VENTRAL HERNIA N/A 11/25/2021   Procedure: XI Robotic  Assisted Incisional Hernia Repair  With Mesh Bilateral retrorectus myofascial release Bilateral transversus abdominis release;  Surgeon: Felicie Morn, MD;  Location: [facility];  Service: General;  Laterality: N/A;    Current Medications: Current Meds  Medication Sig   acetaminophen (TYLENOL) 500 MG tablet Take 1,000 mg by mouth every 8 (eight) hours as needed for moderate pain.   ALPRAZolam (XANAX) 0.5 MG tablet Take 0.5 mg by mouth 2 (two) times daily as needed for anxiety.   aluminum-magnesium hydroxide-simethicone (MAALOX) I037812 MG/5ML SUSP Take 30 mLs by mouth 3 (three) times daily as needed (for gas/indigestion).    aspirin EC 81 MG tablet Take 1 tablet (81 mg total) by mouth daily.   atorvastatin (LIPITOR) 80 MG tablet 1 TABLET BY MOUTH DAILY AT 6 PM (Patient taking differently: Take 80 mg by mouth daily.)   carvedilol (COREG) 12.5 MG tablet Take 12.5 mg by mouth in the morning.    Cholecalciferol (VITAMIN D3) 125 MCG (5000 UT) CAPS Take 5,000 Units by mouth daily.   dapagliflozin propanediol (FARXIGA) 10 MG TABS tablet Take 10 mg by mouth daily.   gabapentin (NEURONTIN) 300 MG capsule Take 1 capsule (300 mg total) by mouth 3 (three) times daily.   ibuprofen (ADVIL) 200 MG tablet Take 400 mg by mouth every 8 (eight) hours as needed for moderate pain.   insulin glargine (LANTUS SOLOSTAR) 100 UNIT/ML Solostar Pen Inject 50 Units into the skin daily. (Patient taking differently: Inject 55 Units into the skin at bedtime.)   insulin lispro (HUMALOG KWIKPEN) 100 UNIT/ML KwikPen Inject 0.12 mLs (12 Units total) into the skin 3 (three) times daily. (Patient taking differently: Inject 16 Units into the skin 3 (three) times daily.)   Menaquinone-7 (VITAMIN K2 PO) Take 1 tablet by mouth daily.   Multiple Vitamins-Calcium (ONE-A-DAY WOMENS FORMULA) TABS Take 1 tablet by mouth daily with breakfast. Unknown strength   nitroGLYCERIN (NITROSTAT) 0.4 MG SL tablet Place 1 tablet (0.4 mg total) under the tongue every 5 (five) minutes  as needed for chest pain.   olmesartan (BENICAR) 40 MG tablet Take 40 mg by mouth daily.   ranolazine (RANEXA) 500 MG 12 hr tablet Take 1 tablet (500 mg total) by mouth 2 (two) times daily.   simethicone (MYLICON) 0000000 MG chewable tablet Chew 125 mg by mouth every 6 (six) hours as needed for flatulence.   TRULICITY 4.5 0000000 SOPN Inject 4.5 mg into the skin once a week.     Allergies:   Shellfish allergy, Shellfish-derived products, Silicone, and Tape   Social History   Socioeconomic History   Marital status: Married    Spouse name: Not on file   Number of children: Not on file   Years of education: Not on file   Highest education level: Not on file  Occupational History   Not on file  Tobacco Use   Smoking status: Never   Smokeless tobacco: Never  Vaping Use   Vaping Use: Never used  Substance and Sexual Activity   Alcohol use: Never   Drug use: Never   Sexual activity:  Yes  Other Topics Concern   Not on file  Social History Narrative   Not on file   Social Determinants of Health   Financial Resource Strain: Not on file  Food Insecurity: Not on file  Transportation Needs: Not on file  Physical Activity: Not on file  Stress: Not on file  Social Connections: Not on file     Family History: The patient's family history includes Diabetes Mellitus II in her father and mother; Heart disease in her father and maternal grandmother; Hypertension in her father and mother. ROS:   Please see the history of present illness.    All 14 point review of systems negative except as described per history of present illness  EKGs/Labs/Other Studies Reviewed:      Recent Labs: 11/27/2021: BUN 19; Creatinine, Ser 0.64; Hemoglobin 12.2; Platelets 228; Potassium 3.6; Sodium 139  Recent Lipid Panel    Component Value Date/Time   CHOL 139 08/07/2019 0932   TRIG 82 08/07/2019 0932   HDL 50 08/07/2019 0932   CHOLHDL 2.8 08/07/2019 0932   CHOLHDL 6.1 06/17/2019 0500   VLDL 23  06/17/2019 0500   LDLCALC 73 08/07/2019 0932    Physical Exam:    VS:  BP 122/70 (BP Location: Right Arm, Patient Position: Sitting)   Pulse 80   Ht 5' (1.524 m)   Wt 258 lb 3.2 oz (117.1 kg)   SpO2 98%   BMI 50.43 kg/m     Wt Readings from Last 3 Encounters:  06/20/22 258 lb 3.2 oz (117.1 kg)  04/18/22 260 lb 12.8 oz (118.3 kg)  03/15/22 260 lb (117.9 kg)     GEN:  Well nourished, well developed in no acute distress HEENT: Normal NECK: No JVD; No carotid bruits LYMPHATICS: No lymphadenopathy CARDIAC: RRR, no murmurs, no rubs, no gallops RESPIRATORY:  Clear to auscultation without rales, wheezing or rhonchi  ABDOMEN: Soft, non-tender, non-distended MUSCULOSKELETAL:  No edema; No deformity  SKIN: Warm and dry LOWER EXTREMITIES: no swelling NEUROLOGIC:  Alert and oriented x 3 PSYCHIATRIC:  Normal affect   ASSESSMENT:    1. S/P CABG x 3   2. S/P hernia repair   3. S/P bariatric surgery   4. Mixed hyperlipidemia   5. Type 2 diabetes mellitus with hyperglycemia, with long-term current use of insulin (HCC)    PLAN:    In order of problems listed above:  Status post coronary bypass graft stable from that point review and appropriate medications. Status post hernia repair status post bariatric surgery.  Recovered. Dyslipidemia: I did review K PN which show me HDL of 50 LDL 73. Diabetes mellitus that before by primary care physician she said it is better I have well last hemoglobin A1c of 8.6 from September but she said now it is below 8 I congratulated her for it and I encouraged her to BL be more active which should help with her diabetes   Medication Adjustments/Labs and Tests Ordered: Current medicines are reviewed at length with the patient today.  Concerns regarding medicines are outlined above.  No orders of the defined types were placed in this encounter.  Medication changes: No orders of the defined types were placed in this encounter.   Signed, Park Liter, MD, [facility] 06/20/2022 3:34 PM    [place]

## 2022-08-15 ENCOUNTER — Ambulatory Visit: Payer: Managed Care, Other (non HMO) | Admitting: Internal Medicine

## 2022-10-13 ENCOUNTER — Other Ambulatory Visit: Payer: Self-pay | Admitting: Cardiology

## 2022-10-16 ENCOUNTER — Other Ambulatory Visit: Payer: Self-pay | Admitting: Cardiology

## 2023-01-19 ENCOUNTER — Ambulatory Visit: Payer: Managed Care, Other (non HMO) | Attending: Cardiology | Admitting: Cardiology

## 2023-01-19 ENCOUNTER — Encounter: Payer: Self-pay | Admitting: Cardiology

## 2023-01-19 VITALS — BP 126/76 | HR 68 | Ht 60.0 in | Wt 270.0 lb

## 2023-01-19 DIAGNOSIS — Z951 Presence of aortocoronary bypass graft: Secondary | ICD-10-CM | POA: Diagnosis not present

## 2023-01-19 DIAGNOSIS — I10 Essential (primary) hypertension: Secondary | ICD-10-CM

## 2023-01-19 DIAGNOSIS — Z794 Long term (current) use of insulin: Secondary | ICD-10-CM

## 2023-01-19 DIAGNOSIS — E785 Hyperlipidemia, unspecified: Secondary | ICD-10-CM | POA: Diagnosis not present

## 2023-01-19 DIAGNOSIS — Z86718 Personal history of other venous thrombosis and embolism: Secondary | ICD-10-CM

## 2023-01-19 DIAGNOSIS — E1165 Type 2 diabetes mellitus with hyperglycemia: Secondary | ICD-10-CM

## 2023-01-19 NOTE — Addendum Note (Signed)
Addended by: Heywood Bene on: 01/19/2023 01:46 PM   Modules accepted: Orders

## 2023-01-19 NOTE — Progress Notes (Signed)
Cardiology Office Note:    Date:  01/19/2023   ID:  Beverly [name], DOB 06/08/1975, MRN [id number]  PCP:  [name], MD  Cardiologist:  [name], MD    Referring MD: [name], MD   Chief Complaint  Patient presents with   Follow-up    History of Present Illness:    Beverly [name] is a 47 y.o. female with past medical history significant for poorly controlled diabetes, essential hypertension, dyslipidemia, morbid obesity, status post gastric bypass surgery 10 years ago, status post coronary artery bypass graft with LIMA to LAD SVG to ramus in intermedius, SVG to PDA that was done in June 19, 2019.  Recovery was complicated by episode of DVT. 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  Denies having any chest pain tightness squeezing pressure burning chest but does report shortness of breath.  She gained significant amount of weight since have seen her last time.  Past Medical History:  Diagnosis Date   CAD (coronary artery disease) 06/16/2019   CAD (coronary artery disease), native coronary artery 07/08/2012   Cellulitis 06/14/2020   Chest pain 06/16/2019   Diabetes mellitus (HCC) 09/23/2019   Diabetes mellitus without complication (HCC)    Dyslipidemia 09/23/2019   Encounter for long-term (current) use of insulin (HCC) 07/08/2012   Gastro-esophageal reflux disease without esophagitis 06/14/2020   Hematuria 07/08/2012   History of DVT (deep vein thrombosis)    History of kidney stones    Hyperglycemia due to type 2 diabetes mellitus (HCC) 06/14/2020   Hyperlipidemia 07/08/2012   Hypertension    Hypokalemia 06/17/2019   Hypothyroidism 06/14/2020   Idiopathic peripheral autonomic neuropathy 06/14/2020   Insulin-requiring or dependent type II diabetes mellitus (HCC)    Multiple thyroid nodules    Obesity     Old myocardial infarction 06/14/2020   Peripheral vascular disease (HCC)    Positive D dimer    Primary generalized (osteo)arthritis 06/14/2020   S/P bariatric surgery 02/19/2014   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   Sleep apnea    no cpap   Type 2 diabetes mellitus with diabetic polyneuropathy, with long-term current use of insulin (HCC) 09/23/2019   Type 2 diabetes mellitus with hyperglycemia, with long-term current use of insulin (HCC) 09/23/2019   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 LIMA TO LAD, SVG TO PD, SVG TO RAMUS;  Surgeon: [name], MD;  Location: [facility];  Service: Open Heart Surgery;  Laterality: N/A;   LEFT HEART CATH AND CORONARY ANGIOGRAPHY N/A 06/18/2019   Procedure: LEFT HEART CATH AND CORONARY ANGIOGRAPHY;  Surgeon: [name], MD;  Location: [facility];  Service: Cardiovascular;  Laterality: N/A;   TEE WITHOUT CARDIOVERSION N/A 06/19/2019   Procedure: TRANSESOPHAGEAL  ECHOCARDIOGRAM (TEE);  Surgeon: [name], MD;  Location: [facility];  Service: Open Heart Surgery;  Laterality: N/A;   XI ROBOTIC ASSISTED VENTRAL HERNIA N/A 11/25/2021   Procedure: XI Robotic  Assisted Incisional Hernia Repair With Mesh Bilateral retrorectus myofascial release Bilateral transversus abdominis release;  Surgeon: Quentin Ore, MD;  Location: [facility];  Service: General;   Laterality: N/A;    Current Medications: Current Meds  Medication Sig   acetaminophen (TYLENOL) 500 MG tablet Take 1,000 mg by mouth every 8 (eight) hours as needed for moderate pain.   ALPRAZolam (XANAX) 0.5 MG tablet Take 0.5 mg by mouth 2 (two) times daily as needed for anxiety.   aluminum-magnesium hydroxide-simethicone (MAALOX) 200-200-20 MG/5ML SUSP Take 30 mLs by mouth 3 (three) times daily as needed (for gas/indigestion).    aspirin EC 81 MG tablet Take 1 tablet (81 mg total) by mouth daily.   atorvastatin (LIPITOR) 80 MG tablet 1 TABLET BY MOUTH DAILY AT 6 PM (Patient taking differently: Take 80 mg by mouth daily.)   carvedilol (COREG) 12.5 MG tablet Take 12.5 mg by mouth in the morning.    Cholecalciferol (VITAMIN D3) 125 MCG (5000 UT) CAPS Take 5,000 Units by mouth daily.   dapagliflozin propanediol (FARXIGA) 10 MG TABS tablet Take 10 mg by mouth daily.   gabapentin (NEURONTIN) 300 MG capsule Take 1 capsule (300 mg total) by mouth 3 (three) times daily.   ibuprofen (ADVIL) 200 MG tablet Take 400 mg by mouth every 8 (eight) hours as needed for moderate pain.   insulin glargine (LANTUS SOLOSTAR) 100 UNIT/ML Solostar Pen Inject 50 Units into the skin daily. (Patient taking differently: Inject 55 Units into the skin at bedtime.)   insulin lispro (HUMALOG KWIKPEN) 100 UNIT/ML KwikPen Inject 0.12 mLs (12 Units total) into the skin 3 (three) times daily. (Patient taking differently: Inject 16 Units into the skin 3 (three) times daily.)   Menaquinone-7 (VITAMIN K2 PO) Take 1 tablet by mouth daily.   Multiple Vitamins-Calcium (ONE-A-DAY WOMENS FORMULA) TABS Take 1 tablet by mouth daily with breakfast. Unknown strength   nitroGLYCERIN (NITROSTAT) 0.4 MG SL tablet Place 1 tablet (0.4 mg total) under the tongue every 5 (five) minutes as needed for chest pain.   olmesartan (BENICAR) 40 MG tablet Take 40 mg by mouth daily.   ranolazine (RANEXA) 500 MG 12 hr tablet Take 1 tablet (500 mg total) by  mouth 2 (two) times daily.   simethicone (MYLICON) 125 MG chewable tablet Chew 125 mg by mouth every 6 (six) hours as needed for flatulence.   TRULICITY 4.5 MG/0.5ML SOPN Inject 4.5 mg into the skin once a week.     Allergies:   Shellfish allergy, Shellfish-derived products, Silicone, and Tape   Social History   Socioeconomic History   Marital status: Married    Spouse name: Not on file   Number of children: Not on file   Years of education: Not on file   Highest education level: Not on file  Occupational History   Not on file  Tobacco Use   Smoking status: Never   Smokeless tobacco: Never  Vaping Use   Vaping status: Never Used  Substance and Sexual Activity   Alcohol use: Never   Drug use: Never   Sexual activity: Yes  Other Topics Concern   Not on file  Social History Narrative   Not on file   Social Determinants of Health   Financial Resource Strain: Not on file  Food Insecurity:  Not on file  Transportation Needs: Not on file  Physical Activity: Not on file  Stress: Not on file  Social Connections: Not on file     Family History: The patient's family history includes Diabetes Mellitus II in her father and mother; Heart disease in her father and maternal grandmother; Hypertension in her father and mother. ROS:   Please see the history of present illness.    All 14 point review of systems negative except as described per history of present illness  EKGs/Labs/Other Studies Reviewed:    EKG Interpretation Date/Time:  Friday January 19 2023 13:16:30 EDT Ventricular Rate:  68 PR Interval:  156 QRS Duration:  100 QT Interval:  410 QTC Calculation: 435 R Axis:   -24  Text Interpretation: Normal sinus rhythm Inferior infarct (cited on or before 16-Jun-2019) Anterior infarct (cited on or before 16-Jun-2019) Abnormal ECG When compared with ECG of 08-Jul-2019 22:07, Previous ECG has undetermined rhythm, needs review Questionable change in initial forces of Lateral  leads Non-specific change in ST segment in Lateral leads Confirmed by [name] [phone]) on 01/19/2023 1:19:05 PM    Recent Labs: No results found for requested labs within last 365 days.  Recent Lipid Panel    Component Value Date/Time   CHOL 139 08/07/2019 0932   TRIG 82 08/07/2019 0932   HDL 50 08/07/2019 0932   CHOLHDL 2.8 08/07/2019 0932   CHOLHDL 6.1 06/17/2019 0500   VLDL 23 06/17/2019 0500   LDLCALC 73 08/07/2019 0932    Physical Exam:    VS:  BP 126/76 (BP Location: Left Arm, Patient Position: Sitting)   Pulse 68   Ht 5' (1.524 m)   Wt 270 lb (122.5 kg)   SpO2 99%   BMI 52.73 kg/m     Wt Readings from Last 3 Encounters:  01/19/23 270 lb (122.5 kg)  06/20/22 258 lb 3.2 oz (117.1 kg)  04/18/22 260 lb 12.8 oz (118.3 kg)     GEN:  Well nourished, well developed in no acute distress HEENT: Normal NECK: No JVD; No carotid bruits LYMPHATICS: No lymphadenopathy CARDIAC: RRR, no murmurs, no rubs, no gallops RESPIRATORY:  Clear to auscultation without rales, wheezing or rhonchi  ABDOMEN: Soft, non-tender, non-distended MUSCULOSKELETAL:  No edema; No deformity  SKIN: Warm and dry LOWER EXTREMITIES: 1+ swelling NEUROLOGIC:  Alert and oriented x 3 PSYCHIATRIC:  Normal affect   ASSESSMENT:    1. Primary hypertension   2. S/P CABG x 3   3. Dyslipidemia   4. History of DVT (deep vein thrombosis)   5. Type 2 diabetes mellitus with hyperglycemia, with long-term current use of insulin (HCC)    PLAN:    In order of problems listed above:  Coronary disease status post coronary bypass graft.  On guideline directed medical therapy which I will continue.  I think the key is to modify her risk factors. Diabetes poorly controlled I will check her hemoglobin A1c today.  I will place referral to endocrinology. Dyslipidemia I did review K PN which show me LDL 73 HDL 50 this is from 2021, we will recheck her fasting lipid profile. History of DVT now stable. It is very  safe to see such a young person with so many problems and quite advanced disease already.  Again I hope that referral to endocrinology will help with management of her diabetes. Swelling of lower extremities I will schedule her to have echocardiogram to assess left ventricle ejection fraction   Medication Adjustments/Labs and Tests Ordered:  Current medicines are reviewed at length with the patient today.  Concerns regarding medicines are outlined above.  Orders Placed This Encounter  Procedures   EKG 12-Lead   Medication changes: No orders of the defined types were placed in this encounter.   Signed, Georgeanna Lea, MD, [facility] 01/19/2023 1:28 PM    [facility]

## 2023-01-19 NOTE — Patient Instructions (Signed)
Medication Instructions:  Your physician recommends that you continue on your current medications as directed. Please refer to the Current Medication list given to you today.  *If you need a refill on your cardiac medications before your next appointment, please call your pharmacy*   Lab Work: Your physician recommends that you return for lab work in: BMP and Pro BNP If you have labs (blood work) drawn today and your tests are completely normal, you will receive your results only by: MyChart Message (if you have MyChart) OR A paper copy in the mail If you have any lab test that is abnormal or we need to change your treatment, we will call you to review the results.   Testing/Procedures: Your physician has requested that you have an echocardiogram. Echocardiography is a painless test that uses sound waves to create images of your heart. It provides your doctor with information about the size and shape of your heart and how well your heart's chambers and valves are working. This procedure takes approximately one hour. There are no restrictions for this procedure.    Follow-Up: At [facility], you and your health needs are our priority.  As part of our continuing mission to provide you with exceptional heart care, we have created designated Provider Care Teams.  These Care Teams include your primary Cardiologist (physician) and Advanced Practice Providers (APPs -  Physician Assistants and Nurse Practitioners) who all work together to provide you with the care you need, when you need it.  We recommend signing up for the patient portal called "MyChart".  Sign up information is provided on this After Visit Summary.  MyChart is used to connect with patients for Virtual Visits (Telemedicine).  Patients are able to view lab/test results, encounter notes, upcoming appointments, etc.  Non-urgent messages can be sent to your provider as well.   To learn more about what you can do with MyChart, go to  [email or web address].     Other Instructions Echocardiogram An echocardiogram is a test that uses sound waves (ultrasound) to produce images of the heart. Images from an echocardiogram can provide important information about: Heart size and shape. The size and thickness and movement of your heart's walls. Heart muscle function and strength. Heart valve function or if you have stenosis. Stenosis is when the heart valves are too narrow. If blood is flowing backward through the heart valves (regurgitation). A tumor or infectious growth around the heart valves. Areas of heart muscle that are not working well because of poor blood flow or injury from a heart attack. Aneurysm detection. An aneurysm is a weak or damaged part of an artery wall. The wall bulges out from the normal force of blood pumping through the body. Tell a health care provider about: Any allergies you have. All medicines you are taking, including vitamins, herbs, eye drops, creams, and over-the-counter medicines. Any blood disorders you have. Any surgeries you have had. Any medical conditions you have. Whether you are pregnant or may be pregnant. What are the risks? Generally, this is a safe test. However, problems may occur, including an allergic reaction to dye (contrast) that may be used during the test. What happens before the test? No specific preparation is needed. You may eat and drink normally. What happens during the test? You will take off your clothes from the waist up and put on a hospital gown. Electrodes or electrocardiogram (ECG)patches may be placed on your chest. The electrodes or patches are then connected to a device that monitors  your heart rate and rhythm. You will lie down on a table for an ultrasound exam. A gel will be applied to your chest to help sound waves pass through your skin. A handheld device, called a transducer, will be pressed against your chest and moved over your heart. The  transducer produces sound waves that travel to your heart and bounce back (or "echo" back) to the transducer. These sound waves will be captured in real-time and changed into images of your heart that can be viewed on a video monitor. The images will be recorded on a computer and reviewed by your health care provider. You may be asked to change positions or hold your breath for a short time. This makes it easier to get different views or better views of your heart. In some cases, you may receive contrast through an IV in one of your veins. This can improve the quality of the pictures from your heart. The procedure may vary among health care providers and hospitals.   What can I expect after the test? You may return to your normal, everyday life, including diet, activities, and medicines, unless your health care provider tells you not to do that. Follow these instructions at home: It is up to you to get the results of your test. Ask your health care provider, or the department that is doing the test, when your results will be ready. Keep all follow-up visits. This is important. Summary An echocardiogram is a test that uses sound waves (ultrasound) to produce images of the heart. Images from an echocardiogram can provide important information about the size and shape of your heart, heart muscle function, heart valve function, and other possible heart problems. You do not need to do anything to prepare before this test. You may eat and drink normally. After the echocardiogram is completed, you may return to your normal, everyday life, unless your health care provider tells you not to do that. This information is not intended to replace advice given to you by your health care provider. Make sure you discuss any questions you have with your health care provider. Document Revised: 11/04/2019 Document Reviewed: 11/04/2019 Elsevier Patient Education  2021 [employer]      Follow-Up: At [facility], you and your health needs are our priority.  As part of our continuing mission to provide you with exceptional heart care, we have created designated Provider Care Teams.  These Care Teams include your primary Cardiologist (physician) and Advanced Practice Providers (APPs -  Physician Assistants and Nurse Practitioners) who all work together to provide you with the care you need, when you need it.  We recommend signing up for the patient portal called "MyChart".  Sign up information is provided on this After Visit Summary.  MyChart is used to connect with patients for Virtual Visits (Telemedicine).  Patients are able to view lab/test results, encounter notes, upcoming appointments, etc.  Non-urgent messages can be sent to your provider as well.   To learn more about what you can do with MyChart, go to [email or web address].    Your next appointment:   3 month(s)  Provider:   [name], MD    Other Instructions

## 2023-01-20 LAB — BASIC METABOLIC PANEL
BUN/Creatinine Ratio: 27 — ABNORMAL HIGH (ref 9–23)
BUN: 19 mg/dL (ref 6–24)
CO2: 27 mmol/L (ref 20–29)
Calcium: 9.5 mg/dL (ref 8.7–10.2)
Chloride: 102 mmol/L (ref 96–106)
Creatinine, Ser: 0.71 mg/dL (ref 0.57–1.00)
Glucose: 78 mg/dL (ref 70–99)
Potassium: 4.3 mmol/L (ref 3.5–5.2)
Sodium: 144 mmol/L (ref 134–144)
eGFR: 105 mL/min/{1.73_m2} (ref >59)

## 2023-01-20 LAB — PRO B NATRIURETIC PEPTIDE: NT-Pro BNP: 248 pg/mL (ref 0–249)

## 2023-01-30 ENCOUNTER — Telehealth: Payer: Self-pay

## 2023-01-30 NOTE — Telephone Encounter (Signed)
Left message on My Chart with normal results per [name]'s note. Routed to PCP. 

## 2023-03-13 ENCOUNTER — Ambulatory Visit: Payer: Commercial Managed Care - HMO | Attending: Cardiology

## 2023-03-13 DIAGNOSIS — Z86718 Personal history of other venous thrombosis and embolism: Secondary | ICD-10-CM

## 2023-03-13 DIAGNOSIS — I10 Essential (primary) hypertension: Secondary | ICD-10-CM | POA: Diagnosis not present

## 2023-03-13 DIAGNOSIS — E785 Hyperlipidemia, unspecified: Secondary | ICD-10-CM

## 2023-03-13 DIAGNOSIS — Z951 Presence of aortocoronary bypass graft: Secondary | ICD-10-CM

## 2023-03-13 DIAGNOSIS — E1165 Type 2 diabetes mellitus with hyperglycemia: Secondary | ICD-10-CM

## 2023-03-13 DIAGNOSIS — Z794 Long term (current) use of insulin: Secondary | ICD-10-CM

## 2023-03-13 LAB — ECHOCARDIOGRAM COMPLETE
AR max vel: 1.27 cm2
AV Area VTI: 1.21 cm2
AV Area mean vel: 1.1 cm2
AV Mean grad: 11 mm[Hg]
AV Peak grad: 17.6 mm[Hg]
Ao pk vel: 2.1 m/s
Area-P 1/2: 4.26 cm2
S' Lateral: 2.9 cm

## 2023-03-13 MED ORDER — PERFLUTREN LIPID MICROSPHERE
1.0000 mL | INTRAVENOUS | Status: AC | PRN
  Administered 2023-03-13: 10 mL via INTRAVENOUS

## 2023-03-23 ENCOUNTER — Telehealth: Payer: Self-pay

## 2023-03-23 NOTE — Telephone Encounter (Signed)
Left message on My Chart with normal Echo results per [name]'s note. Routed to PCP.  

## 2023-03-23 NOTE — Telephone Encounter (Signed)
 Pt viewed Echo results on My Chart per Dr. Vanetta Shawl note. Routed to PCP.

## 2023-04-23 ENCOUNTER — Ambulatory Visit: Payer: Managed Care, Other (non HMO) | Admitting: Cardiology
